# Patient Record
Sex: Male | Born: 1945
Health system: Southern US, Community
[De-identification: ages and names within clinical notes are randomized; demographics above are authoritative.]

## PROBLEM LIST (undated history)

## (undated) DIAGNOSIS — L821 Other seborrheic keratosis: Secondary | ICD-10-CM

## (undated) DIAGNOSIS — C4490 Unspecified malignant neoplasm of skin, unspecified: Secondary | ICD-10-CM

## (undated) DIAGNOSIS — I480 Paroxysmal atrial fibrillation: Secondary | ICD-10-CM

## (undated) DIAGNOSIS — E785 Hyperlipidemia, unspecified: Secondary | ICD-10-CM

## (undated) DIAGNOSIS — K219 Gastro-esophageal reflux disease without esophagitis: Secondary | ICD-10-CM

## (undated) DIAGNOSIS — B159 Hepatitis A without hepatic coma: Secondary | ICD-10-CM

## (undated) DIAGNOSIS — F32A Depression, unspecified: Secondary | ICD-10-CM

## (undated) DIAGNOSIS — F329 Major depressive disorder, single episode, unspecified: Secondary | ICD-10-CM

## (undated) DIAGNOSIS — R269 Unspecified abnormalities of gait and mobility: Secondary | ICD-10-CM

## (undated) DIAGNOSIS — B029 Zoster without complications: Secondary | ICD-10-CM

## (undated) DIAGNOSIS — I10 Essential (primary) hypertension: Secondary | ICD-10-CM

## (undated) HISTORY — PX: SPINE SURGERY: SHX786

## (undated) HISTORY — DX: Depression, unspecified: F32.A

## (undated) HISTORY — DX: Gastro-esophageal reflux disease without esophagitis: K21.9

## (undated) HISTORY — DX: Zoster without complications: B02.9

## (undated) HISTORY — PX: COLONOSCOPY: SHX174

## (undated) HISTORY — DX: Major depressive disorder, single episode, unspecified: F32.9

## (undated) HISTORY — PX: VASECTOMY: SHX75

## (undated) HISTORY — PX: HERNIA REPAIR: SHX51

## (undated) HISTORY — DX: Paroxysmal atrial fibrillation: I48.0

## (undated) HISTORY — DX: Other seborrheic keratosis: L82.1

## (undated) HISTORY — DX: Hyperlipidemia, unspecified: E78.5

## (undated) HISTORY — PX: POLYPECTOMY: SHX149

## (undated) HISTORY — PX: TONSILLECTOMY AND ADENOIDECTOMY: SUR1326

## (undated) HISTORY — DX: Essential (primary) hypertension: I10

## (undated) HISTORY — DX: Unspecified malignant neoplasm of skin, unspecified: C44.90

## (undated) HISTORY — DX: Hepatitis a without hepatic coma: B15.9

## (undated) HISTORY — DX: Unspecified abnormalities of gait and mobility: R26.9

---
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
## 1999-07-16 HISTORY — PX: COLONOSCOPY W/ POLYPECTOMY: SHX1380

## 2001-10-19 ENCOUNTER — Encounter: Payer: Self-pay | Admitting: Internal Medicine

## 2001-10-19 ENCOUNTER — Encounter: Admission: RE | Admit: 2001-10-19 | Discharge: 2001-10-19 | Payer: Self-pay | Admitting: Internal Medicine

## 2002-07-15 HISTORY — PX: CYSTOSCOPY: SUR368

## 2003-01-01 ENCOUNTER — Emergency Department (HOSPITAL_COMMUNITY): Admission: EM | Admit: 2003-01-01 | Discharge: 2003-01-01 | Payer: Self-pay | Admitting: *Deleted

## 2004-09-17 ENCOUNTER — Ambulatory Visit: Payer: Self-pay | Admitting: Internal Medicine

## 2004-10-12 ENCOUNTER — Ambulatory Visit: Payer: Self-pay | Admitting: Internal Medicine

## 2004-12-31 ENCOUNTER — Ambulatory Visit: Payer: Self-pay | Admitting: Internal Medicine

## 2005-01-18 ENCOUNTER — Ambulatory Visit: Payer: Self-pay | Admitting: Internal Medicine

## 2005-01-22 ENCOUNTER — Ambulatory Visit: Payer: Self-pay | Admitting: Internal Medicine

## 2005-04-15 ENCOUNTER — Ambulatory Visit: Payer: Self-pay | Admitting: Internal Medicine

## 2005-05-20 ENCOUNTER — Ambulatory Visit: Payer: Self-pay | Admitting: Internal Medicine

## 2005-10-07 ENCOUNTER — Ambulatory Visit: Payer: Self-pay | Admitting: Internal Medicine

## 2006-04-11 ENCOUNTER — Ambulatory Visit: Payer: Self-pay | Admitting: Internal Medicine

## 2006-04-21 ENCOUNTER — Ambulatory Visit: Payer: Self-pay | Admitting: Internal Medicine

## 2006-07-21 ENCOUNTER — Ambulatory Visit: Payer: Self-pay | Admitting: Internal Medicine

## 2006-07-21 LAB — CONVERTED CEMR LAB
Free T4: 0.9 ng/dL (ref 0.6–1.6)
Magnesium: 2.2 mg/dL (ref 1.5–2.5)
Potassium: 3.6 meq/L (ref 3.5–5.1)
T3, Free: 2.8 pg/mL (ref 2.3–4.2)
TSH: 0.72 microintl units/mL (ref 0.35–5.50)

## 2006-08-29 ENCOUNTER — Ambulatory Visit: Payer: Self-pay | Admitting: Internal Medicine

## 2006-08-29 LAB — CONVERTED CEMR LAB
Cholesterol: 117 mg/dL (ref 0–200)
HDL: 34.1 mg/dL — ABNORMAL LOW (ref >39.0)
LDL Cholesterol: 65 mg/dL (ref 0–99)
Total CHOL/HDL Ratio: 3.4
Triglycerides: 92 mg/dL (ref 0–149)
VLDL: 18 mg/dL (ref 0–40)

## 2006-09-15 ENCOUNTER — Ambulatory Visit: Payer: Self-pay | Admitting: Internal Medicine

## 2006-12-16 ENCOUNTER — Telehealth: Payer: Self-pay | Admitting: Internal Medicine

## 2006-12-18 DIAGNOSIS — M4690 Unspecified inflammatory spondylopathy, site unspecified: Secondary | ICD-10-CM | POA: Insufficient documentation

## 2006-12-18 DIAGNOSIS — IMO0002 Reserved for concepts with insufficient information to code with codable children: Secondary | ICD-10-CM | POA: Insufficient documentation

## 2006-12-29 ENCOUNTER — Ambulatory Visit: Payer: Self-pay | Admitting: Gastroenterology

## 2007-01-05 ENCOUNTER — Encounter: Payer: Self-pay | Admitting: Internal Medicine

## 2007-01-12 ENCOUNTER — Ambulatory Visit: Payer: Self-pay | Admitting: Gastroenterology

## 2007-01-12 ENCOUNTER — Telehealth (INDEPENDENT_AMBULATORY_CARE_PROVIDER_SITE_OTHER): Payer: Self-pay | Admitting: *Deleted

## 2007-02-23 ENCOUNTER — Encounter: Payer: Self-pay | Admitting: Internal Medicine

## 2007-05-22 ENCOUNTER — Telehealth: Payer: Self-pay | Admitting: Internal Medicine

## 2007-05-29 ENCOUNTER — Ambulatory Visit: Payer: Self-pay | Admitting: Internal Medicine

## 2007-06-01 ENCOUNTER — Ambulatory Visit: Payer: Self-pay | Admitting: Internal Medicine

## 2007-06-01 DIAGNOSIS — I10 Essential (primary) hypertension: Secondary | ICD-10-CM | POA: Insufficient documentation

## 2007-08-25 ENCOUNTER — Telehealth (INDEPENDENT_AMBULATORY_CARE_PROVIDER_SITE_OTHER): Payer: Self-pay | Admitting: *Deleted

## 2007-10-21 ENCOUNTER — Telehealth (INDEPENDENT_AMBULATORY_CARE_PROVIDER_SITE_OTHER): Payer: Self-pay | Admitting: *Deleted

## 2008-03-11 ENCOUNTER — Ambulatory Visit: Payer: Self-pay | Admitting: Internal Medicine

## 2008-03-11 DIAGNOSIS — Z85828 Personal history of other malignant neoplasm of skin: Secondary | ICD-10-CM | POA: Insufficient documentation

## 2008-03-11 DIAGNOSIS — E785 Hyperlipidemia, unspecified: Secondary | ICD-10-CM | POA: Insufficient documentation

## 2008-03-11 DIAGNOSIS — N400 Enlarged prostate without lower urinary tract symptoms: Secondary | ICD-10-CM | POA: Insufficient documentation

## 2008-03-11 DIAGNOSIS — D126 Benign neoplasm of colon, unspecified: Secondary | ICD-10-CM | POA: Insufficient documentation

## 2008-03-11 DIAGNOSIS — I480 Paroxysmal atrial fibrillation: Secondary | ICD-10-CM | POA: Insufficient documentation

## 2008-03-11 LAB — CONVERTED CEMR LAB
Cholesterol, target level: 200 mg/dL
HDL goal, serum: 40 mg/dL
LDL Goal: 75 mg/dL

## 2008-03-29 ENCOUNTER — Telehealth: Payer: Self-pay | Admitting: Internal Medicine

## 2008-06-08 ENCOUNTER — Ambulatory Visit: Payer: Self-pay | Admitting: Internal Medicine

## 2008-06-08 LAB — CONVERTED CEMR LAB
ALT: 21 units/L (ref 0–53)
AST: 20 units/L (ref 0–37)
Albumin: 3.7 g/dL (ref 3.5–5.2)
Alkaline Phosphatase: 28 units/L — ABNORMAL LOW (ref 39–117)
BUN: 16 mg/dL (ref 6–23)
Basophils Absolute: 0 10*3/uL (ref 0.0–0.1)
Basophils Relative: 0.2 % (ref 0.0–3.0)
Bilirubin, Direct: 0.1 mg/dL (ref 0.0–0.3)
CO2: 26 meq/L (ref 19–32)
Calcium: 8.7 mg/dL (ref 8.4–10.5)
Chloride: 108 meq/L (ref 96–112)
Cholesterol: 161 mg/dL (ref 0–200)
Creatinine, Ser: 1 mg/dL (ref 0.4–1.5)
Eosinophils Absolute: 0.3 10*3/uL (ref 0.0–0.7)
Eosinophils Relative: 5 % (ref 0.0–5.0)
GFR calc Af Amer: 97 mL/min
GFR calc non Af Amer: 80 mL/min
Glucose, Bld: 94 mg/dL (ref 70–99)
HCT: 43.2 % (ref 39.0–52.0)
HDL: 39.3 mg/dL (ref >39.0)
Hemoglobin: 14.9 g/dL (ref 13.0–17.0)
LDL Cholesterol: 90 mg/dL (ref 0–99)
Lymphocytes Relative: 38.6 % (ref 12.0–46.0)
MCHC: 34.5 g/dL (ref 30.0–36.0)
MCV: 95.3 fL (ref 78.0–100.0)
Monocytes Absolute: 0.7 10*3/uL (ref 0.1–1.0)
Monocytes Relative: 10.1 % (ref 3.0–12.0)
Neutro Abs: 3.3 10*3/uL (ref 1.4–7.7)
Neutrophils Relative %: 46.1 % (ref 43.0–77.0)
PSA: 0.38 ng/mL (ref 0.10–4.00)
Platelets: 241 10*3/uL (ref 150–400)
Potassium: 3.8 meq/L (ref 3.5–5.1)
RBC: 4.54 M/uL (ref 4.22–5.81)
RDW: 12.2 % (ref 11.5–14.6)
Sodium: 140 meq/L (ref 135–145)
TSH: 1.22 microintl units/mL (ref 0.35–5.50)
Total Bilirubin: 0.9 mg/dL (ref 0.3–1.2)
Total CHOL/HDL Ratio: 4.1
Total Protein: 6.5 g/dL (ref 6.0–8.3)
Triglycerides: 157 mg/dL — ABNORMAL HIGH (ref 0–149)
VLDL: 31 mg/dL (ref 0–40)
WBC: 7 10*3/uL (ref 4.5–10.5)

## 2008-06-15 ENCOUNTER — Ambulatory Visit: Payer: Self-pay | Admitting: Internal Medicine

## 2008-06-15 LAB — CONVERTED CEMR LAB
Cholesterol, target level: 200 mg/dL
HDL goal, serum: 40 mg/dL
LDL Goal: 100 mg/dL

## 2009-01-03 ENCOUNTER — Telehealth (INDEPENDENT_AMBULATORY_CARE_PROVIDER_SITE_OTHER): Payer: Self-pay | Admitting: *Deleted

## 2009-03-23 ENCOUNTER — Encounter: Payer: Self-pay | Admitting: Internal Medicine

## 2009-04-21 ENCOUNTER — Telehealth (INDEPENDENT_AMBULATORY_CARE_PROVIDER_SITE_OTHER): Payer: Self-pay | Admitting: *Deleted

## 2009-05-11 ENCOUNTER — Telehealth (INDEPENDENT_AMBULATORY_CARE_PROVIDER_SITE_OTHER): Payer: Self-pay | Admitting: *Deleted

## 2009-05-26 ENCOUNTER — Ambulatory Visit: Payer: Self-pay | Admitting: Internal Medicine

## 2009-05-26 DIAGNOSIS — R7303 Prediabetes: Secondary | ICD-10-CM | POA: Insufficient documentation

## 2009-05-26 DIAGNOSIS — R739 Hyperglycemia, unspecified: Secondary | ICD-10-CM | POA: Insufficient documentation

## 2009-06-26 ENCOUNTER — Ambulatory Visit: Payer: Self-pay | Admitting: Internal Medicine

## 2009-07-27 ENCOUNTER — Telehealth (INDEPENDENT_AMBULATORY_CARE_PROVIDER_SITE_OTHER): Payer: Self-pay | Admitting: *Deleted

## 2009-10-12 ENCOUNTER — Telehealth (INDEPENDENT_AMBULATORY_CARE_PROVIDER_SITE_OTHER): Payer: Self-pay | Admitting: *Deleted

## 2010-01-09 ENCOUNTER — Telehealth (INDEPENDENT_AMBULATORY_CARE_PROVIDER_SITE_OTHER): Payer: Self-pay | Admitting: *Deleted

## 2010-01-18 ENCOUNTER — Encounter: Payer: Self-pay | Admitting: Internal Medicine

## 2010-01-18 ENCOUNTER — Telehealth (INDEPENDENT_AMBULATORY_CARE_PROVIDER_SITE_OTHER): Payer: Self-pay | Admitting: *Deleted

## 2010-02-13 ENCOUNTER — Telehealth: Payer: Self-pay | Admitting: Internal Medicine

## 2010-03-12 ENCOUNTER — Encounter: Payer: Self-pay | Admitting: Internal Medicine

## 2010-05-22 ENCOUNTER — Ambulatory Visit: Payer: Self-pay | Admitting: Internal Medicine

## 2010-05-24 ENCOUNTER — Ambulatory Visit: Payer: Self-pay | Admitting: Cardiology

## 2010-05-24 DIAGNOSIS — E663 Overweight: Secondary | ICD-10-CM | POA: Insufficient documentation

## 2010-05-25 LAB — CONVERTED CEMR LAB
BUN: 22 mg/dL (ref 6–23)
Creatinine, Ser: 1 mg/dL (ref 0.4–1.5)
Potassium: 4.2 meq/L (ref 3.5–5.1)

## 2010-05-31 ENCOUNTER — Ambulatory Visit: Payer: Self-pay

## 2010-05-31 ENCOUNTER — Ambulatory Visit: Payer: Self-pay | Admitting: Cardiology

## 2010-05-31 ENCOUNTER — Encounter: Payer: Self-pay | Admitting: Internal Medicine

## 2010-05-31 DIAGNOSIS — R0602 Shortness of breath: Secondary | ICD-10-CM | POA: Insufficient documentation

## 2010-06-14 ENCOUNTER — Telehealth (INDEPENDENT_AMBULATORY_CARE_PROVIDER_SITE_OTHER): Payer: Self-pay | Admitting: *Deleted

## 2010-06-15 ENCOUNTER — Ambulatory Visit: Payer: Self-pay | Admitting: Cardiology

## 2010-06-15 ENCOUNTER — Ambulatory Visit (HOSPITAL_COMMUNITY)
Admission: RE | Admit: 2010-06-15 | Discharge: 2010-06-15 | Payer: Self-pay | Source: Home / Self Care | Admitting: Cardiology

## 2010-06-15 ENCOUNTER — Ambulatory Visit: Payer: Self-pay

## 2010-06-22 ENCOUNTER — Telehealth (INDEPENDENT_AMBULATORY_CARE_PROVIDER_SITE_OTHER): Payer: Self-pay | Admitting: *Deleted

## 2010-08-12 LAB — CONVERTED CEMR LAB
Cholesterol: 161 mg/dL (ref 0–200)
HDL: 38.7 mg/dL — ABNORMAL LOW (ref >39.00)
Hgb A1c MFr Bld: 5.6 % (ref 4.6–6.5)
LDL Cholesterol: 98 mg/dL (ref 0–99)
PSA: 0.29 ng/mL (ref 0.10–4.00)
Total CHOL/HDL Ratio: 4
Triglycerides: 121 mg/dL (ref 0.0–149.0)
VLDL: 24.2 mg/dL (ref 0.0–40.0)

## 2010-08-14 NOTE — Progress Notes (Signed)
Summary: strength increase  Phone Note Refill Request   Refills Requested: Medication #1:  CRESTOR 20 MG TABS 1 once daily Mon NEW RX FOR CRESTOR 40MG  #30 TAKE 1/2 TAB  MWF. pt is currently take crestor 20mg  mwf and would like to know if it is ok to increase med strength and take 1/2 tab...............Marland KitchenFelecia Deloach CMA  January 03, 2009 4:43 PM   Follow-up for Phone Call        dr hopper pls advise on med strength increase...........Marland KitchenFelecia Deloach CMA  January 03, 2009 4:45 PM  Additional Follow-up for Phone Call Additional follow up Details #1::        OK Additional Follow-up by: Marga Melnick MD,  January 03, 2009 4:53 PM    Additional Follow-up for Phone Call Additional follow up Details #2::    pt aware rx sent to pharmacy...........Marland KitchenFelecia Deloach CMA  January 04, 2009 11:35 AM  New/Updated Medications: CRESTOR 40 MG TABS (ROSUVASTATIN CALCIUM) take 1/2 tab  once daily Mon, Weds, Fri   Prescriptions: CRESTOR 40 MG TABS (ROSUVASTATIN CALCIUM) take 1/2 tab  once daily Mon, Weds, Fri  #30 x 5   Entered by:   Jeremy Johann CMA   Authorized by:   Marga Melnick MD   Signed by:   Jeremy Johann CMA on 01/04/2009   Method used:   Faxed to ...       [facility] [address] (retail)       [phone]       [place], [place]  [address]       Ph: [phone]       Fax: [phone]   RxID:   [id number]

## 2010-08-14 NOTE — Progress Notes (Signed)
Summary: Stress Echo Pre- Procedure  Phone Note Outgoing Call   Call placed by: Antionette Char RN,  June 14, 2010 10:51 AM Call placed to: Patient Reason for Call: Confirm/change Appt Summary of Call: Left message on AM regarding Dobutamine Echo instructions. Instructed patient to arrive at 8 am. Bonnita Levan RN

## 2010-08-14 NOTE — Progress Notes (Signed)
Summary: Refill Requests               Phone Note Refill Request Message from:  Patient on June 22, 2010 1:50 PM  Refills Requested: Medication #1:  TOPROL XL 50 MG XR24H-TAB 1 by mouth two times a day   Dosage confirmed as above?Dosage Confirmed   Supply Requested: 3 months  Medication #2:  BENAZEPRIL HCL 20 MG  TABS 1 by mouth two times a day   Dosage confirmed as above?Dosage Confirmed   Supply Requested: 3 months  Medication #3:  CARDIZEM CD 180 MG XR24H-CAP 1 by mouth  two times a day   Dosage confirmed as above?Dosage Confirmed   Supply Requested: 3 months [facility], forms with Tresa Endo  Next Appointment Scheduled: 1.19.12 Initial call taken by: Harold Barban,  June 22, 2010 1:50 PM  Follow-up for Phone Call        Forms not needed I will fax them directly. Left message on machine to call back to office. Lucious Groves CMA  June 22, 2010 2:39 PM   Additional Follow-up for Phone Call Additional follow up Details #1::        Pt is aware that we have faxed to [facility].  Additional Follow-up by: Army Fossa CMA,  June 22, 2010 2:56 PM    Prescriptions: CARDIZEM CD 180 MG XR24H-CAP (DILTIAZEM HCL COATED BEADS) 1 by mouth  two times a day  #180 x 1   Entered by:   Lucious Groves CMA   Authorized by:   Marga Melnick MD   Signed by:   Lucious Groves CMA on 06/22/2010   Method used:   Faxed to ...       [facility] (mail-order)             , [place]         Ph: [phone]       Fax: [phone]   RxID:   [id number] BENAZEPRIL HCL 20 MG  TABS (BENAZEPRIL HCL) 1 by mouth two times a day  #180 x 1   Entered by:   Lucious Groves CMA   Authorized by:   Marga Melnick MD   Signed by:   Lucious Groves CMA on 06/22/2010   Method used:   Faxed to ...       [facility] (mail-order)             , [place]         Ph: [phone]       Fax: [phone]   RxID:   [id number] TOPROL XL 50 MG XR24H-TAB (METOPROLOL SUCCINATE) 1 by mouth two times a day  #180 x 1   Entered by:   Lucious Groves  CMA   Authorized by:   Marga Melnick MD   Signed by:   Lucious Groves CMA on 06/22/2010   Method used:   Faxed to ...       [facility] (mail-order)             , [place]         Ph: [phone]       Fax: [phone]   RxID:   [id number]

## 2010-08-14 NOTE — Progress Notes (Signed)
Summary: NEEDS 23 DAY [facility] PRESCRIPTIONS  Phone Note Call from Patient Call back at [facility] CELL = [phone]   Caller: Patient Summary of Call: PATIENT'S WIFE BARBARA DROPPED OFF LETTER REQUESTING TWO PRESCRIPTIONS THAT NEED TO GO TO [facility]  PATIENT NOW HAS MEDICARE ONLY  NEEDS 90 DAY SUPPLY OF METOPROLOL--SEE COPY OF PRESCRIPTION LABEL  NEEDS 90 DAY SUPPLY FOR PREVACID 30 MG CAPSULES  ALSO REQUESTS SAMPLES OF CRESTOR  WILL TAKE LETTER TO [facility]RAE'S DESK IN [employer] Initial call taken by: Jerolyn Shin,  January 18, 2010 2:51 PM  Follow-up for Phone Call        Patient's wife aware RX for Metoprolol sent in, Samples of Crestor at the front and I will f/u with her on Monday Re: Acid reflux med.   Dr.Hopper patient would like a acid reflux med that has a generic to be sent to [facility], Please advise Follow-up by: Shonna Chock,  January 19, 2010 5:11 PM  Additional Follow-up for Phone Call Additional follow up Details #1::        Per Dr.Hopper Omeprazole pre breakfast.   Patient's wife aware Additional Follow-up by: Shonna Chock,  January 19, 2010 5:25 PM    New/Updated Medications: PREVACID 30 MG CPDR (LANSOPRAZOLE) 1 by mouth once daily OMEPRAZOLE 20 MG TBEC (OMEPRAZOLE) 1 by mouth before breakfast Prescriptions: OMEPRAZOLE 20 MG TBEC (OMEPRAZOLE) 1 by mouth before breakfast  #90 x 3   Entered by:   Shonna Chock   Authorized by:   Marga Melnick MD   Signed by:   Shonna Chock on 01/19/2010   Method used:   Faxed to ...       [facility] (mail-order)             , [place]         Ph: [phone]       Fax: [phone]   RxID:   [id number] TOPROL XL 50 MG XR24H-TAB (METOPROLOL SUCCINATE) 1 by mouth two times a day  #180 x 1   Entered by:   Shonna Chock   Authorized by:   Marga Melnick MD   Signed by:   Shonna Chock on 01/19/2010   Method used:   Faxed to ...       [facility] (mail-order)             , [place]         Ph: [phone]       Fax: [phone]  RxID:   [id number]

## 2010-08-14 NOTE — Progress Notes (Signed)
Summary: WANTS SHINGLES VACCINE  Phone Note Call from Patient Call back at [phone]   Caller: Spouse Summary of Call: PT WIFE CALLED HAVE VISIT 05/26/09, WANTS TO GET SHINGLES VACCINE, ALSO PNEUMONIA SHOT. WANTS  NURSE TO KNOW Initial call taken by: Kandice Hams,  May 11, 2009 3:34 PM  Follow-up for Phone Call        Noted, I will inform patient when she comes in that Shingles vaccine is on back order and she will have to call periodically to see if its in Follow-up by: Shonna Chock,  May 11, 2009 3:49 PM

## 2010-08-14 NOTE — Progress Notes (Signed)
Summary: REQUESTS CITALOPRAM  Phone Note Call from Patient Call back at HIS CELL = [phone]   Caller: WIFE BARBARA Summary of Call: WIFE BROUGHT IN LETTER FROM PATIENT---HE IS ASKING IF DR HOPPER CAN WRITE HIM A PRESCRIPTION FOR CITALOPRAM 20 MG??  HE HAS ENCLOSED A [facility] FORM TO REQUEST A 90 DAY SUPPLY  IF ANY QUESTIONIS, PLEASE CALL HIM ON HIS CELL PHONE AT [phone] OR HIS WIFE BARBARA CELL PHONE AT [phone]  WILL TAKE FORM AND LETTER TO [facility] IN [employer] Initial call taken by: Jerolyn Shin,  February 13, 2010 3:04 PM  Follow-up for Phone Call        Dr.Hopper please advise, this was placed on ledge yesterday for review Follow-up by: Shonna Chock CMA,  February 14, 2010 8:36 AM  Additional Follow-up for Phone Call Additional follow up Details #1::        OK #90    Additional Follow-up for Phone Call Additional follow up Details #2::    Faxed, left message on voicemail notifying patient. Follow-up by: Lucious Groves CMA,  February 14, 2010 4:33 PM  New/Updated Medications: CITALOPRAM HYDROBROMIDE 20 MG TABS (CITALOPRAM HYDROBROMIDE) 1 once daily Prescriptions: CITALOPRAM HYDROBROMIDE 20 MG TABS (CITALOPRAM HYDROBROMIDE) 1 once daily  #90 x 0   Entered and Authorized by:   Marga Melnick MD   Signed by:   Marga Melnick MD on 02/14/2010   Method used:   Printed then faxed to ...       [facility] [address] (retail)       [phone]       [place], [place]  [address]       Ph: [phone]       Fax: [phone]   RxID:   [id number]

## 2010-08-14 NOTE — Assessment & Plan Note (Signed)
Summary: med refill//fd Denton Lank wants to check and see if he has tentas ...   Vital Signs:  Patient profile:   65 year old male Height:      71 inches Weight:      238.4 pounds BMI:     33.37 Temp:     97.7 degrees F oral Pulse rate:   54 / minute Resp:     14 per minute BP sitting:   130 / 82  (left arm) Cuff size:   large  Vitals Entered By: Shonna Chock (May 26, 2009 11:11 AM) CC: CPX with fasting labs , Hypertension Management, Lipid Management Comments REVIEWED MED LIST, PATIENT AGREED DOSE AND INSTRUCTION CORRECT    CC:  CPX with fasting labs , Hypertension Management, and Lipid Management.  History of Present Illness: Admitted overnight for RAF 03/22/2009; lab records reviewed : labs essen WNL. Dr Rhona Leavens seen post D/C ; CCB started , Amlodipine D/Ced. BP controlled with increase in ACE-I & CCB doses. On ASA 325 MG DAILY, NO ANTICOAGULANTS. Lipids not assessed.  Hypertension History:      He complains of peripheral edema, but denies headache, chest pain, palpitations, dyspnea with exertion, orthopnea, PND, visual symptoms, neurologic problems, syncope, and side effects from treatment.  He notes no problems with any antihypertensive medication side effects.  BP now 115-120/75-80.        Positive major cardiovascular risk factors include male age 56 years old or older, hyperlipidemia, and hypertension.  Negative major cardiovascular risk factors include no history of diabetes, negative family history for ischemic heart disease, and non-tobacco-user status.        Further assessment for target organ damage reveals no history of ASHD, stroke/TIA, or peripheral vascular disease.    Lipid Management History:      Positive NCEP/ATP III risk factors include male age 65 years old or older, HDL cholesterol less than 40, and hypertension.  Negative NCEP/ATP III risk factors include non-diabetic, no family history for ischemic heart disease, non-tobacco-user status, no ASHD (atherosclerotic  heart disease), no prior stroke/TIA, no peripheral vascular disease, and no history of aortic aneurysm.      Preventive Screening-Counseling & Management  Caffeine-Diet-Exercise     Does Patient Exercise: no  Allergies (verified): No Known Drug Allergies  Past History:  Past Medical History: Hypertension hepatitis age 71 Atrial fibrillation, paroxysmal 12/07, 03/2009 Hyperlipidemia Skin cancer, hx of basal cell, Dr Gabriel Cirri Spondylosis  @ L5-S1  Past Surgical History: Vasectomy T/A Colonoscopy 2008 : neg, Dr Barnet Pall (due 2018) ; Cystoscopy 2004 for microscopic hematuria: negative Colon polypectomy 2003 Concussion age 88  Family History: daughter -hematuria, neg evaluation Father:Alsheimer's, pacer, valve replacement  Mother: pacer Siblings: neg Puncle MI @ 57; no premature CAD in FH  Social History: Never Smoked Low fat diet Occupation: [employer] part time Married Alcohol use-yes Regular exercise-no Does Patient Exercise:  no  Review of Systems General:  Complains of sleep disorder; denies fatigue and weight loss; Wife describes "gasping " @ night.Screen prompted full OSA study 11/23.. Eyes:  Denies blurring, double vision, and vision loss-both eyes. ENT:  Denies difficulty swallowing and hoarseness. CV:  Denies leg cramps with exertion, lightheadness, and near fainting. Resp:  Complains of excessive snoring; denies hypersomnolence and morning headaches. GI:  Denies abdominal pain, bloody stools, dark tarry stools, and indigestion. GU:  Denies discharge, dysuria, and hematuria. Derm:  Denies poor wound healing. Neuro:  Denies disturbances in coordination, numbness, poor balance, and tingling. Endo:  Denies cold  intolerance, excessive hunger, excessive thirst, excessive urination, and heat intolerance; Hyperglycemia(102,130) in hospital  03/2009.  Physical Exam  General:  well-nourished,in no acute distress; alert,appropriate and cooperative  throughout examination Head:  Normocephalic and atraumatic without obvious abnormalities. No apparent alopecia  Neck:  No deformities, masses, or tenderness noted. Lungs:  Normal respiratory effort, chest expands symmetrically. Lungs are clear to auscultation, no crackles or wheezes. Heart:  regular rhythm and bradycardia S4.   Abdomen:  Bowel sounds positive,abdomen soft and non-tender without masses, organomegaly or hernias noted. Rectal:  No external abnormalities noted. Normal sphincter tone. No rectal masses or tenderness. Genitalia:  Testes bilaterally descended without nodularity, tenderness or masses. No scrotal masses or lesions. No penis lesions or urethral discharge.L varicocele.   Prostate:  Prostate gland firm and smooth,ULN  enlargement, w/o nodularity, tenderness, mass, asymmetry or induration.   Impression & Recommendations:  Problem # 1:  ATRIAL FIBRILLATION (ICD-427.31)  Resolved on Beta blocker & CCB His updated medication list for this problem includes:    Toprol Xl 50 Mg Xr24h-tab (Metoprolol succinate) .Marland Kitchen... 1 by mouth two times a day    Cardizem Cd 180 Mg Xr24h-cap (Diltiazem hcl coated beads) .Marland Kitchen... 1 by mouth once daily  Orders: EKG w/ Interpretation (93000)  Problem # 2:  FASTING HYPERGLYCEMIA (ICD-790.29)  Orders: TLB-A1C / Hgb A1C (Glycohemoglobin) (83036-A1C)  Problem # 3:  HYPERLIPIDEMIA (ICD-272.4)  His updated medication list for this problem includes:    Crestor 20 Mg Tabs (Rosuvastatin calcium) ..... Once daily as directed  Orders: EKG w/ Interpretation (93000) TLB-Lipid Panel (80061-LIPID)  Problem # 4:  HYPERTENSION, ESSENTIAL NOS (ICD-401.9)  Controlled His updated medication list for this problem includes:    Toprol Xl 50 Mg Xr24h-tab (Metoprolol succinate) .Marland Kitchen... 1 by mouth two times a day    Benazepril Hcl 20 Mg Tabs (Benazepril hcl) .Marland Kitchen... 1 tab qd    Cardizem Cd 180 Mg Xr24h-cap (Diltiazem hcl coated beads) .Marland Kitchen... 1 by mouth once  daily  Orders: EKG w/ Interpretation (93000)  Problem # 5:  HYPERPLASIA PROSTATE UNS W/O UR OBST & OTH LUTS (ICD-600.90)  Orders: TLB-PSA (Prostate Specific Antigen) (84153-PSA)  Complete Medication List: 1)  Toprol Xl 50 Mg Xr24h-tab (Metoprolol succinate) .Marland Kitchen.. 1 by mouth two times a day 2)  Benazepril Hcl 20 Mg Tabs (Benazepril hcl) .Marland Kitchen.. 1 tab qd 3)  Cardizem Cd 180 Mg Xr24h-cap (Diltiazem hcl coated beads) .Marland Kitchen.. 1 by mouth once daily 4)  Asa 325mg  Qd  5)  Fish Oil  6)  Multivitamin  7)  Crestor 20 Mg Tabs (Rosuvastatin calcium) .... Once daily as directed  Other Orders: Tdap => 13yrs IM (16109) Admin 1st Vaccine (60454)  Hypertension Assessment/Plan:      The patient's hypertensive risk group is category B: At least one risk factor (excluding diabetes) with no target organ damage.  His calculated 10 year risk of coronary heart disease is 9 %.  Today's blood pressure is 130/82.    Lipid Assessment/Plan:      Based on NCEP/ATP III, the patient's risk factor category is "2 or more risk factors and a calculated 10 year CAD risk of < 20%".  The patient's lipid goals are as follows: Total cholesterol goal is 200; LDL cholesterol goal is 100; HDL cholesterol goal is 40; Triglyceride goal is 150.  His LDL cholesterol goal has been met.    Patient Instructions: 1)  Consume < 40 grams of sugar / day from labeled  foods/ drinks. Crestor 20 mg M,W,&  F. Prescriptions: CRESTOR 20 MG TABS (ROSUVASTATIN CALCIUM) once daily as directed  #30 x 5   Entered and Authorized by:   Marga Melnick MD   Signed by:   Marga Melnick MD on 05/26/2009   Method used:   Print then Give to Patient   RxID:   [id number]    Immunizations Administered:  Tetanus Vaccine:    Vaccine Type: Tdap    Site: right deltoid    Mfr: Sanofi Pasteur    Dose: 0.5 ml    Route: IM    Given by: Shonna Chock    Exp. Date: 01/19/2011    Lot #: [id number]    VIS given: 06/02/07 version given May 26, 2009.  Appended Document: med refill//fd /wife wants to check and see if he has tentas .Marland KitchenMarland Kitchen

## 2010-08-14 NOTE — Miscellaneous (Signed)
Summary: Appointment Canceled  Appointment status changed to canceled by [employer] on 05/31/2010 9:52 AM.  Cancellation Comments --------------------- stress echo with joyce/sl  Appointment Information ----------------------- Appt Type:  EMR NO DOCUMENT      Date:  Friday, June 22, 2010      Time:  2:00 PM for 30 min   Urgency:  Routine   Made By:  Pearson Grippe  To Visit:  [id number]    Reason:  stress echo with joyce/sl  Appt Comments ------------- -- 05/31/10 9:52: (CEMR) CANCELED -- stress echo with joyce/sl -- 05/31/10 9:43: (CEMR) BOOKED -- Routine EMR NO DOCUMENT at 06/22/2010 2:00 PM for 30 min stress echo with joyce/sl

## 2010-08-14 NOTE — Assessment & Plan Note (Signed)
Summary: np6/afib/essential HTN      Allergies Added: NKDA  Visit Type:  Initial Consult Primary Provider:  Marga Melnick MD  CC:  Atrial Fibrillation.  History of Present Illness: The patient presents as a new patient switching his care to our cardiovascular practice. He has had 2 episodes of atrial fibrillation in the past. Both were treated at [facility] and converted spontaneously. He did have an echocardiogram he reports last year but he said was normal and a stress test about 4 years ago. In between these episodes he is does not describe palpitations, presyncope or syncope. He doesn't exercise although he can push mower and climbs stairs routinely. With this level of activity he does not get chest pressure, neck or arm discomfort. He does not have shortness of breath, PND or orthopnea.  Current Medications (verified): 1)  Toprol Xl 50 Mg Xr24h-Tab (Metoprolol Succinate) .Marland Kitchen.. 1 By Mouth Two Times A Day 2)  Benazepril Hcl 20 Mg  Tabs (Benazepril Hcl) .Marland Kitchen.. 1 By Mouth Two Times A Day 3)  Cardizem Cd 180 Mg Xr24h-Cap (Diltiazem Hcl Coated Beads) .Marland Kitchen.. 1 By Mouth  Two Times A Day 4)  Asa 325mg  Qd 5)  Fish Oil 6)  Multivitamin 7)  Crestor 20 Mg Tabs (Rosuvastatin Calcium) .... Once Daily As Directed 8)  Omeprazole 20 Mg Tbec (Omeprazole) .Marland Kitchen.. 1 By Mouth Before Breakfast, As Needed 9)  Hydrochlorothiazide 12.5 Mg Caps (Hydrochlorothiazide) .Marland Kitchen.. 1 Once Daily  Allergies (verified): No Known Drug Allergies  Past History:  Past Medical History: Hypertension Hepatitis age 7 Atrial fibrillation, paroxysmal 12/07, 03/2009 Hyperlipidemia Skin cancer, hx of basal cell, Dr Gabriel Cirri Spondylosis  @ L5-S1  Past Surgical History: Reviewed history from 05/26/2009 and no changes required. Vasectomy T/A Colonoscopy 2008 : neg, Dr Barnet Pall (due 2018) ; Cystoscopy 2004 for microscopic hematuria: negative Colon polypectomy 2003 Concussion age 18  Family  History: Daughter -hematuria, neg evaluation Father:Alsheimer's, pacer, valve replacement  Mother: pacer Siblings: neg Puncle MI @ 58; no premature CAD in FH  Review of Systems       Positive for joint pains, reflux. Negative for all other systems.  Vital Signs:  Patient profile:   65 year old male Height:      71 inches Weight:      244 pounds BMI:     34.15 Pulse rate:   61 / minute Resp:     16 per minute BP sitting:   148 / 84  (right arm)  Vitals Entered By: Marrion Coy, CNA (May 24, 2010 10:23 AM)  Physical Exam  General:  Well developed, well nourished, in no acute distress. Head:  normocephalic and atraumatic Eyes:  PERRLA/EOM intact; conjunctiva and lids normal. Mouth:  Teeth, gums and palate normal. Oral mucosa normal. Neck:  Neck supple, no JVD. No masses, thyromegaly or abnormal cervical nodes. Chest Wall:  no deformities Lungs:  Clear bilaterally to auscultation and percussion. Abdomen:  Bowel sounds positive; abdomen soft and non-tender without masses, organomegaly, or hernias noted. No hepatosplenomegaly. Msk:  Back normal, normal gait. Muscle strength and tone normal. Extremities:  No clubbing or cyanosis. Neurologic:  Alert and oriented x 3. Skin:  Intact without lesions or rashes. Cervical Nodes:  no significant adenopathy Axillary Nodes:  no significant adenopathy Inguinal Nodes:  no significant adenopathy Psych:  Normal affect.   Detailed Cardiovascular Exam  Neck    Carotids: Carotids full and equal bilaterally without bruits.      Neck Veins: Normal, no  JVD.    Heart    Inspection: no deformities or lifts noted.      Palpation: normal PMI with no thrills palpable.      Auscultation: regular rate and rhythm, S1, S2 without murmurs, rubs, gallops, or clicks.    Vascular    Abdominal Aorta: no palpable masses, pulsations, or audible bruits.      Femoral Pulses: normal femoral pulses bilaterally.      Pedal Pulses: normal pedal pulses  bilaterally.      Radial Pulses: normal radial pulses bilaterally.      Peripheral Circulation: no clubbing, cyanosis, or edema noted with normal capillary refill.     EKG  Procedure date:  05/24/2010  Findings:      Sinus rhythm, rate 60, left axis deviation, early transition in lead V2, no acute ST-T wave changes.  Impression & Recommendations:  Problem # 1:  ATRIAL FIBRILLATION (ICD-427.31) This point I agree with current therapy. If this happens again I would strongly consider "pill in the pocket" flecainide. I would like to do an exercise treadmill test and get the recent echo to make sure he has no overt structural or other heart disease. Orders: EKG w/ Interpretation (93000) Treadmill (Treadmill)  Problem # 2:  HYPERTENSION, ESSENTIAL NOS (ICD-401.9) His blood pressure is fold. He will continue the meds as listed.  Problem # 3:  OVERWEIGHT (ICD-278.02) I discussed with him the need for exercise and weight loss. I will give to him specific instructions at the time of his treadmill.  Patient Instructions: 1)  Your physician recommends that you schedule a follow-up appointment at the time of your treadmill 2)  Your physician recommends that you continue on your current medications as directed. Please refer to the Current Medication list given to you today. 3)  Your physician has requested that you have an exercise tolerance test.  For further information please visit [email or web address]  Please also follow instruction sheet, as given.

## 2010-08-14 NOTE — Progress Notes (Signed)
Summary: TOPROL XL 50MG   Phone Note Refill Request Message from:  Pharmacy on [facility]   Refills Requested: Medication #1:  TOPROL XL 50MG  QTY 60--- PHARMACY DIDN'T LEAVE DIRECTIONS-----   [facility] [id number] FAX [phone]    DR.HOPPER   Method Requested: Fax to Local Pharmacy Initial call taken by: Vanessa [place],  August 25, 2007 3:35 PM  Follow-up for Phone Call        CALLED AND VERIFIED HE TAKES ONE TABLET DAILY......................................................................Marland KitchenWendall Stade  August 25, 2007 3:44 PM

## 2010-08-14 NOTE — Progress Notes (Signed)
Summary: rx - dr hopper  Phone Note Call from Patient Call back at [phone]   Caller: Spouse Summary of Call: needs rx for metoprolol 50mg  1 tab daily [facility] - [id number]  Initial call taken by: Okey Regal [place],  January 12, 2007 9:23 AM  Follow-up for Phone Call        RX CALLED IN [facility] VM PT WIFE AWARE Follow-up by: Kandice Hams,  January 12, 2007 10:10 AM  New/Updated Medications: METOPROLOL TARTRATE 50 MG  TABS (METOPROLOL TARTRATE) TAKE 1 QD  New/Updated Medications: METOPROLOL TARTRATE 50 MG  TABS (METOPROLOL TARTRATE) TAKE 1 QD  Prescriptions: METOPROLOL TARTRATE 50 MG  TABS (METOPROLOL TARTRATE) TAKE 1 QD  #30 x 6   Entered by:   Kandice Hams   Authorized by:   Marga Melnick MD   Signed by:   Kandice Hams on 01/12/2007   Method used:   Telephoned to ...         RxID:   [id number]

## 2010-08-14 NOTE — Assessment & Plan Note (Signed)
Summary: bp up/cbs   Vital Signs:  Patient profile:   65 year old male Weight:      244.4 pounds BMI:     34.21 Temp:     98.4 degrees F oral Pulse rate:   56 / minute Resp:     15 per minute BP sitting:   124 / 80  (left arm) Cuff size:   large  Vitals Entered By: Shonna Chock CMA (May 22, 2010 8:04 AM) CC: B/P concerns Comments **Patient changed med (benazepril and metoprolol to 3 x daily for a little while to decrease B/P)**   CC:  B/P concerns.  History of Present Illness: Hypertension Follow-Up      This is a 65 year old man who presents for Hypertension follow-up.  The patient reports slight  edema, but denies lightheadedness, urinary frequency, headaches, and fatigue.  The patient denies the following associated symptoms: chest pain, chest pressure, exercise intolerance, dyspnea, palpitations, and syncope.  Adjunctive measures currently used by the patient include salt restriction.  BP rose to 150/100 over 3 weeks. He increased Metoprolol & Benazepril to three times a day with drop in BP to 130/85 & pulse to 57 . Note : he is on CCB AND Beta blocker as per Dr. Joelyn Oms, [facility] because of 2 episodes of AF. In 2006 Metoprolol was started & in 2010 Cardizem was added. He requests referral to [facility]. No PMH of gout or hyperglycemia.  Allergies (verified): No Known Drug Allergies  Physical Exam  General:  well-nourished;alert,appropriate and cooperative throughout examination Eyes:  No corneal or conjunctival inflammation noted.  Funduscopic exam benign, without hemorrhages, exudates or papilledema. Lungs:  Normal respiratory effort, chest expands symmetrically. Lungs are clear to auscultation, no crackles or wheezes. Heart:  Normal rate and regular rhythm. S1 and S2 normal without gallop, murmur, click, rub or other extra sounds. Abdomen:  Bowel sounds positive,abdomen soft and non-tender without masses, organomegaly or hernias noted. No AAA or renal  artery bruits Pulses:  R and L carotid,radial,dorsalis pedis and posterior tibial pulses are full and equal bilaterally Extremities:  No clubbing, cyanosis, edema.   Impression & Recommendations:  Problem # 1:  HYPERTENSION, ESSENTIAL NOS (ICD-401.9)  Recent rise w/o trigger His updated medication list for this problem includes:    Toprol Xl 50 Mg Xr24h-tab (Metoprolol succinate) .Marland Kitchen... 1 by mouth two times a day    Benazepril Hcl 20 Mg Tabs (Benazepril hcl) .Marland Kitchen... 1 by mouth two times a day    Cardizem Cd 180 Mg Xr24h-cap (Diltiazem hcl coated beads) .Marland Kitchen... 1 by mouth  two times a day    Hydrochlorothiazide 12.5 Mg Caps (Hydrochlorothiazide) .Marland Kitchen... 1 once daily  Orders: Cardiology Referral (Cardiology) Venipuncture [phone]) TLB-Creatinine, Blood (82565-CREA) TLB-Potassium (K+) (84132-K) TLB-BUN (Urea Nitrogen) (84520-BUN)  Problem # 2:  ATRIAL FIBRILLATION (ICD-427.31)  PMH of X 2 His updated medication list for this problem includes:    Toprol Xl 50 Mg Xr24h-tab (Metoprolol succinate) .Marland Kitchen... 1 by mouth two times a day    Cardizem Cd 180 Mg Xr24h-cap (Diltiazem hcl coated beads) .Marland Kitchen... 1 by mouth  two times a day  Orders: Cardiology Referral (Cardiology)  Complete Medication List: 1)  Toprol Xl 50 Mg Xr24h-tab (Metoprolol succinate) .Marland Kitchen.. 1 by mouth two times a day 2)  Benazepril Hcl 20 Mg Tabs (Benazepril hcl) .Marland Kitchen.. 1 by mouth two times a day 3)  Cardizem Cd 180 Mg Xr24h-cap (Diltiazem hcl coated beads) .Marland Kitchen.. 1 by mouth  two times  a day 4)  Asa 325mg  Qd  5)  Fish Oil  6)  Multivitamin  7)  Crestor 20 Mg Tabs (Rosuvastatin calcium) .... Once daily as directed 8)  Omeprazole 20 Mg Tbec (Omeprazole) .Marland Kitchen.. 1 by mouth before breakfast, as needed 9)  Hydrochlorothiazide 12.5 Mg Caps (Hydrochlorothiazide) .Marland Kitchen.. 1 once daily  Patient Instructions: 1)  See  updated Med List  Prescriptions: HYDROCHLOROTHIAZIDE 12.5 MG CAPS (HYDROCHLOROTHIAZIDE) 1 once daily  #30 x 5   Entered and  Authorized by:   Marga Melnick MD   Signed by:   Marga Melnick MD on 05/22/2010   Method used:   Print then Give to Patient   RxID:   [id number]    Orders Added: 1)  Est. Patient Level IV [95621] 2)  Cardiology Referral [Cardiology] 3)  Venipuncture [30865] 4)  TLB-Creatinine, Blood [82565-CREA] 5)  TLB-Potassium (K+) [84132-K] 6)  TLB-BUN (Urea Nitrogen) [84520-BUN]  Appended Document: bp up/cbs

## 2010-08-14 NOTE — Assessment & Plan Note (Signed)
Summary: FOLLOW UP ON MEDS/CDJ   Vital Signs:  Patient Profile:   65 Years Old Male Weight:      233.6 pounds Pulse rate:   60 / minute Resp:     18 per minute BP sitting:   120 / 80  (left arm)  Pt. in pain?   no  Vitals Entered By: Shonna Chock (March 11, 2008 8:07 AM)                  Chief Complaint:  FOLLOW-UP ON MEDS.  History of Present Illness:  DMV requires physical exam.He was last seen @ [facility]iology for PAF; they do not manage lipids. Nuclear Stress Test was negative. The Vytorin dose is 1/2 of 10/20; 30 cost $125. Crestor would cost $25 / month.  Hypertension History:      He complains of palpitations, but denies headache, chest pain, dyspnea with exertion, orthopnea, PND, peripheral edema, visual symptoms, neurologic problems, syncope, and side effects from treatment.  He notes no problems with any antihypertensive medication side effects.  Further comments include: BP @ home 110/75 on average. Rare palpitations.        Positive major cardiovascular risk factors include male age 57 years old or older, hyperlipidemia, and hypertension.  Negative major cardiovascular risk factors include no history of diabetes, negative family history for ischemic heart disease, and non-tobacco-user status.        Further assessment for target organ damage reveals no history of ASHD, stroke/TIA, or peripheral vascular disease.    Lipid Management History:      Positive NCEP/ATP III risk factors include male age 21 years old or older, HDL cholesterol less than 40, and hypertension.  Negative NCEP/ATP III risk factors include non-diabetic, no family history for ischemic heart disease, non-tobacco-user status, no ASHD (atherosclerotic heart disease), no prior stroke/TIA, no peripheral vascular disease, and no history of aortic aneurysm.        Current Allergies (reviewed today): No known allergies   Past Medical History:    Hypertension    hepatitis age 49    Atrial  fibrillation, paroxysmal 12/07    Hyperlipidemia    Skin cancer, hx of basal cell    Spondylosis  @ L5-S1  Past Surgical History:    Vasectomy    T/A    Colonoscopy 2008 : neg, Dr Barnet Pall ; Cystoscopy 2004 for microscopic hematuria: negative    Colon polypectomy 2003    Concussion age 42   Family History:    daughter -hematuria, neg evaluation    Father:Alsheimer's, pacer, valve replacement     Mother: pacer    Siblings: neg    Puncle MI @ 39  Social History:    Never Smoked    Low fat diet   Risk Factors:  Alcohol use:  yes    Type:  1-2 /day Exercise:  yes    Times per week:  3    Type:  walks 30-45 min w/o symptoms  Family History Risk Factors:    Family History of MI in females < 46 years old:  no    Family History of MI in males < 52 years old:  no   Review of Systems  General      Denies fatigue, sleep disorder, and weight loss.  Eyes      Denies blurring, double vision, and vision loss-both eyes.  ENT      Denies difficulty swallowing and hoarseness.  CV  Denies bluish discoloration of lips or nails and leg cramps with exertion.  Resp      Denies cough and shortness of breath.  GI      Complains of indigestion.      Denies abdominal pain, bloody stools, and dark tarry stools.      TUMS as needed   GU      Denies dysuria, hematuria, and nocturia.  MS      Denies joint pain, low back pain, mid back pain, muscle aches, muscle weakness, and thoracic pain.  Derm      Denies changes in nail beds, dryness, and hair loss.      PMH basal cell  Neuro      Denies disturbances in coordination, numbness, poor balance, and tingling.  Psych      Denies anxiety and depression.  Endo      Denies cold intolerance, excessive hunger, excessive thirst, excessive urination, heat intolerance, polyuria, and weight change.  Heme      Denies abnormal bruising and bleeding.   Physical Exam  General:     well-nourished,in no acute distress;  alert,appropriate and cooperative throughout examination Head:     Normocephalic and atraumatic without obvious abnormalities. No apparent alopecia or balding. Eyes:     No corneal or conjunctival inflammation noted.Perrla. Funduscopic exam benign, without hemorrhages, exudates or papilledema. Ears:     External ear exam shows no significant lesions or deformities.  Otoscopic examination reveals clear canals, tympanic membranes are intact bilaterally without bulging, retraction, inflammation or discharge. Hearing is grossly normal bilaterally. Nose:     External nasal examination shows no deformity or inflammation. Nasal mucosa are pink and moist without lesions or exudatte.Slight septal dislocation Mouth:     Oral mucosa and oropharynx without lesions or exudates.  Teeth in good repair. pharynx pink and moist.   Neck:     No deformities, masses, or tenderness noted. Lungs:     Normal respiratory effort, chest expands symmetrically. Lungs are clear to auscultation, no crackles or wheezes. Heart:     Normal rate and regular rhythm. S1 and S2 normal without gallop, murmur, click, rub . S4 with slurring Abdomen:     Bowel sounds positive,abdomen soft and non-tender without masses, organomegaly or hernias noted. Rectal:     Given stool cards Genitalia:     Testes bilaterally descended without nodularity, tenderness or masses. No scrotal masses or lesions. No penis lesions or urethral discharge. Prostate:     PSA will be checked Pulses:     R and L carotid,radial,dorsalis pedis and posterior tibial pulses are full and equal bilaterally Extremities:     No clubbing, cyanosis, edema, or deformity noted . Neurologic:     cranial nerves II-XII intact and DTRs symmetrical and normal.   Skin:     6X37mm scaley lesion L maxilla Cervical Nodes:     No lymphadenopathy noted Axillary Nodes:     No palpable lymphadenopathy Psych:     memory intact for recent and remote, normally interactive,  good eye contact, not anxious appearing, and not depressed appearing.      Impression & Recommendations:  Problem # 1:  PHYSICAL EXAMINATION (ICD-V70.0)  Problem # 2:  ATRIAL FIBRILLATION (ICD-427.31) PMH of PAF X1 The following medications were removed from the medication list:    Toprol Xl 100 Mg Tb24 (Metoprolol succinate) .Marland Kitchen... 1 by mouth once daily as directed   Problem # 3:  HYPERLIPIDEMIA (ICD-272.4)  The following medications were removed from  the medication list:    Vytorin 10-20 Mg Tabs (Ezetimibe-simvastatin) .Marland Kitchen... 1/2 tab qd  His updated medication list for this problem includes:    Crestor 20 Mg Tabs (Rosuvastatin calcium) .Marland Kitchen... 1 qd   Problem # 4:  HYPERTENSION, ESSENTIAL NOS (ICD-401.9)  The following medications were removed from the medication list:    Toprol Xl 100 Mg Tb24 (Metoprolol succinate) .Marland Kitchen... 1 by mouth once daily as directed  His updated medication list for this problem includes:    Benazepril Hcl 20 Mg Tabs (Benazepril hcl) .Marland Kitchen... 1 tab qd    Amlodipine Besylate 10 Mg Tabs (Amlodipine besylate) .Marland Kitchen... 1 tab qd   Problem # 5:  HYPERPLASIA PROSTATE UNS W/O UR OBST & OTH LUTS (ICD-600.90)  Problem # 6:  SKIN CANCER, HX OF (ICD-V10.83) as per Derm  Problem # 7:  COLONIC POLYPS (ICD-211.3) PMH of  Complete Medication List: 1)  Metoprolol Xl 100mg   .... 1/2 qd 2)  Benazepril Hcl 20 Mg Tabs (Benazepril hcl) .Marland Kitchen.. 1 tab qd 3)  Amlodipine Besylate 10 Mg Tabs (Amlodipine besylate) .Marland Kitchen.. 1 tab qd 4)  Asa 325mg  Qd  5)  Niacin 500mg  Qd  6)  Fish Oil  7)  Multivitamin  8)  Crestor 20 Mg Tabs (Rosuvastatin calcium) .Marland Kitchen.. 1 qd  Hypertension Assessment/Plan:      The patient's hypertensive risk group is category B: At least one risk factor (excluding diabetes) with no target organ damage.  His calculated 10 year risk of coronary heart disease is 9 %.  Today's blood pressure is 120/80.    Lipid Assessment/Plan:      Based on NCEP/ATP III, the patient's  risk factor category is "2 or more risk factors and a calculated 10 year CAD risk of < 20%".  The patient's lipid goals have been set as follows: Total cholesterol goal is 200; LDL cholesterol goal is 75; HDL cholesterol goal is 40; Triglyceride goal is 150.  His LDL cholesterol goal has been met.     Patient Instructions: 1)  Complete stool cards. 2)  Please schedule a follow-up appointment in 3 months.Codes : 427.31, 211.3,272.4,401.9,600.9,995.20 3)  BMP prior to visit, ICD-9: 4)  Hepatic Panel prior to visit, ICD-9: 5)  Lipid Panel prior to visit, ICD-9: 6)  TSH prior to visit, ICD-9: 7)  CBC w/ Diff prior to visit, ICD-9: 8)  PSA prior to visit, ICD-9:   Prescriptions: AMLODIPINE BESYLATE 10 MG  TABS (AMLODIPINE BESYLATE) 1 tab qd  #90 x 3   Entered and Authorized by:   Marga Melnick MD   Signed by:   Marga Melnick MD on 03/11/2008   Method used:   Print then Give to Patient   RxID:   [id number] BENAZEPRIL HCL 20 MG  TABS (BENAZEPRIL HCL) 1 tab qd  #90 x 3   Entered and Authorized by:   Marga Melnick MD   Signed by:   Marga Melnick MD on 03/11/2008   Method used:   Print then Give to Patient   RxID:   [id number] CRESTOR 20 MG TABS (ROSUVASTATIN CALCIUM) 1 qd  #30 x 2   Entered and Authorized by:   Marga Melnick MD   Signed by:   Marga Melnick MD on 03/11/2008   Method used:   Print then Give to Patient   RxID:   [id number] METOPROLOL XL  100MG  1/2 qd  #30 x 5   Entered and Authorized by:   Marga Melnick MD   Signed by:  Marga Melnick MD on 03/11/2008   Method used:   Print then Give to Patient   RxID:   [id number]  ]

## 2010-08-14 NOTE — Assessment & Plan Note (Signed)
Summary: Shingles Vaccin/scm   Nurse Visit   Allergies: No Known Drug Allergies  Immunizations Administered:  Zostavax # 1:    Vaccine Type: Zostavax    Site: right deltoid    Mfr: Merck    Dose: 0.77mL    Route: IM    Given by: [employer]    Exp. Date: 07/29/2010    Lot #: [id number]  Orders Added: 1)  Zoster (Shingles) Vaccine Live [90736] 2)  Admin 1st Vaccine [16109]

## 2010-08-14 NOTE — Progress Notes (Signed)
Summary: PT WANTS RX FOR VYTORIN  Phone Note Call from Patient Call back at Home Phone [phone] Call back at [phone]   Caller: Patient Call For: TRIAGE Reason for Call: Refill Medication Summary of Call: DR HOPPER   PT WANTS A WRITTEN RX FOR VYTORIN//PLEASE CALL PT WHEN READY FOR PICK UP will pull chart and give to hop for approval   Kandice Hams  June  3, 20082:40 PM    Initial call taken by: Job Founds,  December 16, 2006 9:29 AM  Follow-up for Phone Call        Rx refilled Follow-up by: Marga Melnick MD,  December 17, 2006 6:23 PM

## 2010-08-14 NOTE — Miscellaneous (Signed)
Summary: Appointment Canceled  Appointment status changed to canceled by [employer] on 05/31/2010 10:51 AM.  Cancellation Comments --------------------- dobutamine echo  Appointment Information ----------------------- Appt Type:  CARDIOLOGY ANCILLARY VISIT      Date:  Wednesday, June 27, 2010      Time:  3:00 PM for 60 min   Urgency:  Routine   Made By:  Hoy Finlay Scheduler  To Visit:  [id number]    Reason:  dobutamine echo  Appt Comments ------------- -- 05/31/10 10:51: ([facility]) CANCELED -- dobutamine echo -- 05/31/10 9:53: ([facility]) BOOKED -- Routine CARDIOLOGY ANCILLARY VISIT at 06/27/2010 3:00 PM for 60 min dobutamine echo

## 2010-08-14 NOTE — Progress Notes (Signed)
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 Follow-up by: Marga Melnick MD,  May 22, 2007 6:20 PM  Additional Follow-up for Phone Call Additional follow up Details #1::        patient here to read ppd and have office visit Additional Follow-up by: Wendall Stade,  June 01, 2007 3:54 PM

## 2010-08-14 NOTE — Progress Notes (Signed)
Summary: RX REFILL METOPROLOL TODAY  Phone Note Call from Patient Call back at [phone]   Caller: Spouse Call For: Marga Melnick MD Reason for Call: Refill Medication Summary of Call: SPOUSE, BARBARA CALLING, STATES PT COMPLETELY OUT OF MED - METOPROLOL 100MG , 1/2 TAB EVERYDAY.  THEY ARE LEAVING TO GO OUT OF TOWN EARLY MORNING TOMORROW, 04-22-09, WANTS RX ASAP.   ****PLEASE CONTACT PRIOR TO SUBMITTING WITH QUESTIONS, BECAUSE WIFE IS STATING TO FILL RX FOR 30 TABLETS INSTEAD OF 30 DAY SUPPLY*****.  [facility] IS PHARMACY. Initial call taken by: Magdalen Spatz [facility],  April 21, 2009 2:16 PM  Follow-up for Phone Call        pt aware rx sent to pharmacy. F/U appt for med schedule.............Marland KitchenFelecia Deloach CMA  April 21, 2009 3:15 PM     Prescriptions: TOPROL XL 100 MG XR24H-TAB (METOPROLOL SUCCINATE) 1/2 by mouth once daily  #30 x 0   Entered by:   Jeremy Johann CMA   Authorized by:   Marga Melnick MD   Signed by:   Jeremy Johann CMA on 04/21/2009   Method used:   Electronically to        [facility] [phone]* (retail)       [address]       [place], [place]  [address]       Ph: [phone]       Fax: [phone]   RxID:   [id number]

## 2010-08-14 NOTE — Assessment & Plan Note (Signed)
Summary: to review labs   Vital Signs:  Patient Profile:   65 Years Old Male Weight:      138.6 pounds Pulse rate:   64 / minute Resp:     16 per minute BP sitting:   112 / 74  (left arm) Cuff size:   large  Vitals Entered By: Shonna Chock (June 15, 2008 10:29 AM)                 Chief Complaint:  FOLLOW-UP ON LABS.  History of Present Illness: Serial labs, HTN,& Lipid Management Panels  & risks reviewed.On Crestor 20 mg 3X/week; CVE decreased & increased intake of  "junk food". High glycemic index/load in diet & High Fructose Corn Syrup role in lipids & weight gain reviewed.  Hypertension History:      He complains of palpitations and peripheral edema, but denies headache, chest pain, dyspnea with exertion, orthopnea, PND, visual symptoms, neurologic problems, syncope, and side effects from treatment.  He notes no problems with any antihypertensive medication side effects.  Further comments include: Edema post standing; occa palpitations with stress or caffeine.        Positive major cardiovascular risk factors include male age 77 years old or older, hyperlipidemia, and hypertension.  Negative major cardiovascular risk factors include no history of diabetes, negative family history for ischemic heart disease, and non-tobacco-user status.        Further assessment for target organ damage reveals no history of ASHD, stroke/TIA, or peripheral vascular disease.    Lipid Management History:      Positive NCEP/ATP III risk factors include male age 49 years old or older, HDL cholesterol less than 40, and hypertension.  Negative NCEP/ATP III risk factors include non-diabetic, no family history for ischemic heart disease, non-tobacco-user status, no ASHD (atherosclerotic heart disease), no prior stroke/TIA, no peripheral vascular disease, and no history of aortic aneurysm.        Current Allergies (reviewed today): No known allergies   Past Medical History:    Hypertension  hepatitis age 12    Atrial fibrillation, paroxysmal 12/07    Hyperlipidemia    Skin cancer, hx of basal cell, Dr Gabriel Cirri    Spondylosis  @ L5-S1  Past Surgical History:    Reviewed history from 03/11/2008 and no changes required:       Vasectomy       T/A       Colonoscopy 2008 : neg, Dr Barnet Pall ; Cystoscopy 2004 for microscopic hematuria: negative       Colon polypectomy 2003       Concussion age 6     Review of Systems  General      Denies weight loss.  Eyes      Denies blurring, double vision, and vision loss-both eyes.  CV      Denies bluish discoloration of lips or nails and leg cramps with exertion.  Derm      Complains of lesion(s).      2 basal cells removed 10/09  Neuro      Denies disturbances in coordination, numbness, poor balance, and tingling.  Psych      Denies anxiety, depression, easily angered, easily tearful, and irritability.  Endo      Denies excessive hunger, excessive thirst, and excessive urination.   Physical Exam  General:     well-nourished,in no acute distress; alert,appropriate and cooperative throughout examination Neck:     No deformities, masses, or tenderness noted. Lungs:  Normal respiratory effort, chest expands symmetrically. Lungs are clear to auscultation, no crackles or wheezes. Heart:     Normal rate and regular rhythm. S1 and S2 normal without gallop, murmur, click, rub. S4 with slurring Abdomen:     Bowel sounds positive,abdomen soft and non-tender without masses, organomegaly or hernias noted. Pulses:     R and L carotid,radial,dorsalis pedis and posterior tibial pulses are full and equal bilaterally Extremities:     Fungal  toenail changes Neurologic:     alert & oriented X3 and DTRs symmetrical and normal.   Skin:     Intact without suspicious lesions or rashes Psych:     memory intact for recent and remote, normally interactive, good eye contact, not anxious appearing, and not depressed appearing.       Impression & Recommendations:  Problem # 1:  HYPERLIPIDEMIA (ICD-272.4) TLC elected rather than increased Crestor His updated medication list for this problem includes:    Crestor 20 Mg Tabs (Rosuvastatin calcium) .Marland Kitchen... 1 once daily mon, weds, fri   Problem # 2:  HYPERTENSION, ESSENTIAL NOS (ICD-401.9)  His updated medication list for this problem includes:    Toprol Xl 100 Mg Xr24h-tab (Metoprolol succinate) .Marland Kitchen... 1/2 by mouth once daily    Benazepril Hcl 20 Mg Tabs (Benazepril hcl) .Marland Kitchen... 1 tab qd    Amlodipine Besylate 10 Mg Tabs (Amlodipine besylate) .Marland Kitchen... 1 tab qd   Problem # 3:  ATRIAL FIBRILLATION (ICD-427.31) Quiescent His updated medication list for this problem includes:    Toprol Xl 100 Mg Xr24h-tab (Metoprolol succinate) .Marland Kitchen... 1/2 by mouth once daily   Problem # 4:  SKIN CANCER, HX OF (ICD-V10.83) as per Dr Stefanie Libel; annual evaluation recommended  Problem # 5:  HYPERPLASIA PROSTATE UNS W/O UR OBST & OTH LUTS (ICD-600.90)  Complete Medication List: 1)  Toprol Xl 100 Mg Xr24h-tab (Metoprolol succinate) .... 1/2 by mouth once daily 2)  Benazepril Hcl 20 Mg Tabs (Benazepril hcl) .Marland Kitchen.. 1 tab qd 3)  Amlodipine Besylate 10 Mg Tabs (Amlodipine besylate) .Marland Kitchen.. 1 tab qd 4)  Asa 325mg  Qd  5)  Niacin 500mg  Qd  6)  Fish Oil  7)  Multivitamin  8)  Crestor 20 Mg Tabs (Rosuvastatin calcium) .Marland Kitchen.. 1 once daily mon, weds, fri 9)  Clarinex 5 Mg Tabs (Desloratadine) .Marland Kitchen.. 1 by mouth as needed  Hypertension Assessment/Plan:      The patient's hypertensive risk group is category B: At least one risk factor (excluding diabetes) with no target organ damage.  His calculated 10 year risk of coronary heart disease is 9 %.  Today's blood pressure is 112/74.    Lipid Assessment/Plan:      Based on NCEP/ATP III, the patient's risk factor category is "2 or more risk factors and a calculated 10 year CAD risk of < 20%".  The patient's lipid goals have been set as follows: Total cholesterol  goal is 200; LDL cholesterol goal is 100; HDL cholesterol goal is 40; Triglyceride goal is 150.  His LDL cholesterol goal has been met.     Patient Instructions: 1)  Follow a low carb program ([employer] or [place]) & CVE 3-4X/week 30-45 min/ session (TLC rather than increased meds). 2)  Please schedule a follow-up appointment in 1 year.Codes : 272.4,995.20,427.31,401.9,600.90 3)  BMP prior to visit, ICD-9: 4)  Hepatic Panel prior to visit, ICD-9: 5)  Lipid Panel prior to visit, ICD-9: 6)  TSH prior to visit, ICD-9: 7)  CBC w/ Diff prior  to visit, ICD-9: 8)  PSA prior to visit, ICD-9:   ]

## 2010-08-14 NOTE — Progress Notes (Signed)
Summary: REFILL BENAZEPRIL  Phone Note Call from Patient Call back at [phone]   Caller: Spouse Call For: Rodney Melnick MD Summary of Call: PATIENT SPOUSE, BARBARA, CALLING TO REQUEST RX FOR PT'S BENAZEPRIL 20MG , 1 TABLET, TWICE DAILY, 90 DAY SUPPLY.  SHE WILL COME BY OUR OFFICE TO PICK-UP RX.  PLEASE CALL HER WHEN READY @ [phone]. Initial call taken by: Magdalen Spatz [facility],  July 27, 2009 2:03 PM    New/Updated Medications: BENAZEPRIL HCL 20 MG  TABS (BENAZEPRIL HCL) 1 by mouth two times a day Prescriptions: BENAZEPRIL HCL 20 MG  TABS (BENAZEPRIL HCL) 1 by mouth two times a day  #180 x 3   Entered by:   Shonna Chock   Authorized by:   Rodney Melnick MD   Signed by:   Shonna Chock on 07/27/2009   Method used:   Print then Give to Patient   RxID:   [id number]

## 2010-08-14 NOTE — Progress Notes (Signed)
Summary: Refill request  Phone Note Refill Request Message from:  Patient  Refills Requested: Medication #1:  CARDIZEM CD 180 MG XR24H-CAP 1 by mouth once daily    Prescriptions: CARDIZEM CD 180 MG XR24H-CAP (DILTIAZEM HCL COATED BEADS) 1 by mouth once daily  #30 x 5   Entered by:   Shonna Chock   Authorized by:   Marga Melnick MD   Signed by:   Shonna Chock on 10/12/2009   Method used:   Faxed to ...       [facility] [address] (retail)       [address]       [place], [place]  [address]       Ph: [phone]       Fax: [phone]   RxID:   [id number]

## 2010-08-14 NOTE — Letter (Signed)
Summary: Physical Exam Report/[facility]  Physical Exam Report/[facility]   Imported By: Lanelle Bal 03/16/2008 09:15:04  _____________________________________________________________________  External Attachment:    Type:   Image     Comment:   External Document

## 2010-08-14 NOTE — Consult Note (Signed)
Summary: [facility]  [facility]   Imported By: Freddy Jaksch 08/14/2007 11:23:23  _____________________________________________________________________  External Attachment:    Type:   Image     Comment:   External Document

## 2010-08-14 NOTE — Letter (Signed)
Summary: Health Assessment/[facility]  Health Assessment/[facility]   Imported By: Lanelle Bal 04/09/2010 13:02:26  _____________________________________________________________________  External Attachment:    Type:   Image     Comment:   External Document

## 2010-08-14 NOTE — Miscellaneous (Signed)
Summary: Appointment Canceled  Appointment status changed to canceled by [employer] on 05/31/2010 10:49 AM.  Cancellation Comments --------------------- dobutamine echo with vanessa/sl  Appointment Information ----------------------- Appt Type:  EMR NO DOCUMENT      Date:  Wednesday, June 27, 2010      Time:  3:00 PM for 30 min   Urgency:  Routine   Made By:  Hoy Finlay Scheduler  To Visit:  [id number]    Reason:  dobutamine echo with vanessa/sl  Appt Comments ------------- -- 05/31/10 10:49: (CEMR) CANCELED -- dobutamine echo with vanessa/sl -- 05/31/10 9:52: ([facility]EM[facility]) BOOKED -- Routine EMR NO DOCUMENT at 06/27/2010 3:00 PM for 30 min dobutamine echo with vanessa/sl

## 2010-08-14 NOTE — Assessment & Plan Note (Signed)
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 in pain?   no  Vitals Entered By: Wendall Stade (June 01, 2007 3:50 PM)                  Chief Complaint:  complete school form.  Hypertension History:      He denies headache, chest pain, palpitations, dyspnea with exertion, orthopnea, PND, peripheral edema, visual symptoms, neurologic problems, syncope, and side effects from treatment.  He notes no problems with any antihypertensive medication side effects.  Further comments include: BP < 120/70.        Positive major cardiovascular risk factors include male age 38 years old or older and hypertension.  Negative major cardiovascular risk factors include non-tobacco-user status.     Current Allergies (reviewed today): No known allergies       Physical Exam  General:     Well-developed,well-nourished,in no acute distress; alert,appropriate and cooperative throughout examination Lungs:     Normal respiratory effort, chest expands symmetrically. Lungs are clear to auscultation, no crackles or wheezes. Heart:     Normal rate and regular rhythm. S1 and S2 normal without gallop, murmur, click, rub. S4 with slurring Abdomen:     Bowel sounds positive,abdomen soft and non-tender without masses, organomegaly or hernias noted. Pulses:     R and L carotid,radial,dorsalis pedis and posterior tibial pulses are full and equal bilaterally Neurologic:     alert & oriented X3.      Impression & Recommendations:  Problem # 1:  HYPERTENSION, ESSENTIAL NOS (ICD-401.9)  His updated medication list for this problem includes:    Metoprolol Tartrate 50 Mg Tabs (Metoprolol tartrate) .Marland Kitchen... Take 1 qd    Benazepril Hcl 20 Mg Tabs (Benazepril hcl) .Marland Kitchen... 1/2 tab qd    Amlodipine  Besylate 10 Mg Tabs (Amlodipine besylate) .Marland Kitchen... 1/2 tab qd   Complete Medication List: 1)  Metoprolol Tartrate 50 Mg Tabs (Metoprolol tartrate) .... Take 1 qd 2)  Benazepril Hcl 20 Mg Tabs (Benazepril hcl) .... 1/2 tab qd 3)  Amlodipine Besylate 10 Mg Tabs (Amlodipine besylate) .... 1/2 tab qd 4)  Vytorin 10-20 Mg Tabs (Ezetimibe-simvastatin) .... 1/2 tab qd 5)  Asa 325mg  Qd  6)  Niacin 500mg  Qd  7)  Fish Oil  8)  Multivitamin   Hypertension Assessment/Plan:      The patient's hypertensive risk group is category B: At least one risk factor (excluding diabetes) with no target organ damage.  His calculated 10 year risk of coronary heart disease is 9 %.  Today's blood pressure is 120/70.     Patient Instructions: 1)  BP goal = average < 130/85.    Prescriptions: METOPROLOL TARTRATE 50 MG  TABS (METOPROLOL TARTRATE) TAKE 1 QD  #30 x 11   Entered and Authorized by:   Marga Melnick MD   Signed by:   Marga Melnick MD on 06/01/2007   Method used:   Print then Give to Patient   RxID:   [id number] AMLODIPINE BESYLATE 10 MG  TABS (AMLODIPINE BESYLATE) 1/2 tab qd  #30 x 6   Entered and Authorized by:   Marga Melnick MD   Signed by:   Marga Melnick MD  on 06/01/2007   Method used:   Print then Give to Patient   RxID:   [id number] BENAZEPRIL HCL 20 MG  TABS (BENAZEPRIL HCL) 1/2 tab qd  #30 x 6   Entered and Authorized by:   Marga Melnick MD   Signed by:   Marga Melnick MD on 06/01/2007   Method used:   Print then Give to Patient   RxID:   [id number]  ]

## 2010-08-14 NOTE — Progress Notes (Signed)
Summary: NEEDS PRESCRIP FOR GENERIC TOPROL RE-WRITTEN  Phone Note Call from Patient Call back at Home Phone [phone] Call back at CELL - [phone]   Caller: Patient Action Taken: Rx Called In Summary of Call: PATIENT SENT IN TYPED NOTE FOR DR HOPPER--STATES THAT HE IS HAVING TROUBLE FINDING GENERIC FOR TOPROL XL 50 MG TIMED RELEASED--HAD TO REFILL USING NON-GENERIC  SAID HE CALLED [facility] AND AT THAT TIME, THEY HAD TOPROL XL 100 MG, GENERIC (METROPROLOL) IN STOCK--PATIENT WANTS TO KNOW IF DR HOPPER COULD REWRITE PRESCRIPTION FOR "TOPROL XL 100 MG, GENERIC, 30 TABLETS PER MONTH" AND HE WOULD CUT THEM IN HALF  STATES THAT HE IS STILL UNEMPLOYED AND THAT COBRA AND FAMILY DEDUCTIBLE PER YEAR ARE QUITE HIGH  SAYS HE OR HIS WIFE WILL PICK UP PRESCRIPTION SO THEY CAN SHOP AROUND FOR BEST PRICE  WILL PUT LETTER IN PLASTIC SLEEVE AND TAKE TO KATHY'S DESK Initial call taken by: Jerolyn Shin,  October 21, 2007 3:46 PM  Follow-up for Phone Call        DR/HOPPER PLEASE ADVISE Follow-up by: Shonna Chock,  October 21, 2007 3:53 PM  Additional Follow-up for Phone Call Additional follow up Details #1::        OK #30, refill X3 Additional Follow-up by: Marga Melnick MD,  October 22, 2007 10:16 AM    Additional Follow-up for Phone Call Additional follow up Details #2::    Called pt informed RX ready for pickup...................................................................Marland KitchenKandice Hams  October 22, 2007 2:22 PM  Follow-up by: Kandice Hams,  October 22, 2007 2:22 PM  New/Updated Medications: TOPROL XL 100 MG  TB24 (METOPROLOL SUCCINATE) 1 by mouth once daily as directed   Prescriptions: TOPROL XL 100 MG  TB24 (METOPROLOL SUCCINATE) 1 by mouth once daily as directed  #30 x 3   Entered by:   Kandice Hams   Authorized by:   Marga Melnick MD   Signed by:   Kandice Hams on 10/22/2007   Method used:   Print then Give to Patient   RxID:   [id number]

## 2010-08-14 NOTE — Procedures (Signed)
Summary: Gastroenterology  Gastroenterology   Imported By: Freddy Jaksch 01/19/2007 15:51:14  _____________________________________________________________________  External Attachment:    Type:   Image     Comment:   COLONOSCOPY

## 2010-08-14 NOTE — Letter (Signed)
Summary: Letter from Patient Regarding Meds  Letter from Patient Regarding Meds   Imported By: Lanelle Bal 01/26/2010 09:41:42  _____________________________________________________________________  External Attachment:    Type:   Image     Comment:   External Document

## 2010-08-14 NOTE — Miscellaneous (Signed)
Summary: Dobut. Echo  Clinical Lists Changes  Problems: Added new problem of SHORTNESS OF BREATH (ICD-786.05) Orders: Added new Referral order of Dobutamine Echo (Dobutamine Echo) - Signed

## 2010-08-14 NOTE — Progress Notes (Signed)
Summary: questions about medication  Phone Note Call from Patient   Caller: Spouse Call For: Britta Mccreedy Summary of Call: Dr. Alwyn Ren  Call Back [phone] ( Barbara's cell)  Pt was seen on August 28 and he forgot to ask for samples of Crestor, Dr. Drue Novel wanted him to finish taking the lipitor first but he want to know if he can start sooner. Crestor 40mg  because the pt want to cut the pill in half. pt want the samples because this will be his first time taking the medication. Initial call taken by: Vanessa [place],  March 29, 2008 2:13 PM  Follow-up for Phone Call        Spoke with patient's wife-patient is currently taking Vytorin and is ready to change. Patient was told to finish Vytorin then change. Patient has taken Vytorin x years now and having muscle aches, then wife states muscle aches probably not related to Vytoin-patient is just tired. Patients wife would like to know  #1-Can he have samples of Crestor 20mg -forgot to ask when he was here (placed samples @ the front)  #2-Can Dr.Hopper Re-write RX for 40mg  and have patient cut pill in half daily. For money saving purposes. Ok for Dr.Hopper to address on Monday (per wife) Follow-up by: Shonna Chock,  March 29, 2008 3:26 PM  Additional Follow-up for Phone Call Additional follow up Details #1::        Give him Crestor coupon for  #30 free  to take 1 pill M,W, F with fasting lipids, GOT,GPT,CPK after 10 weeks( 272.4,995.20) Additional Follow-up by: Marga Melnick MD,  March 29, 2008 8:51 PM    Additional Follow-up for Phone Call Additional follow up Details #2::    Left detailed message for patient that samples up front and if any questions to contact office, also needs labs in 10 weeks, to please call the office. Ardyth Man  April 01, 2008 1:24 PM  Follow-up by: Ardyth Man,  April 01, 2008 1:24 PM

## 2010-08-14 NOTE — Progress Notes (Signed)
Summary: [facility]  Phone Note Refill Request Message from:  Pharmacy  Refills Requested: Medication #1:  CARDIZEM CD 180 MG XR24H-CAP 1 by mouth once daily  Medication #2:  BENAZEPRIL HCL 20 MG  TABS 1 by mouth two times a day [facility]   Method Requested: Electronic Initial call taken by: Shonna Chock,  January 09, 2010 11:13 AM    Prescriptions: BENAZEPRIL HCL 20 MG  TABS (BENAZEPRIL HCL) 1 by mouth two times a day  #180 x 1   Entered by:   Shonna Chock   Authorized by:   Marga Melnick MD   Signed by:   Shonna Chock on 01/09/2010   Method used:   Faxed to ...       [facility] (mail-order)             , [place]         Ph: [phone]       Fax: [phone]   RxID:   [id number] CARDIZEM CD 180 MG XR24H-CAP (DILTIAZEM HCL COATED BEADS) 1 by mouth once daily  #90 x 1   Entered by:   Shonna Chock   Authorized by:   Marga Melnick MD   Signed by:   Shonna Chock on 01/09/2010   Method used:   Faxed to ...       [facility] (mail-order)             , [place]         Ph: [phone]       Fax: [phone]   RxID:   [id number]

## 2010-09-06 ENCOUNTER — Encounter: Payer: Self-pay | Admitting: Internal Medicine

## 2010-09-12 ENCOUNTER — Encounter (INDEPENDENT_AMBULATORY_CARE_PROVIDER_SITE_OTHER): Payer: Medicare Other | Admitting: Internal Medicine

## 2010-09-12 ENCOUNTER — Encounter: Payer: Self-pay | Admitting: Internal Medicine

## 2010-09-12 DIAGNOSIS — I10 Essential (primary) hypertension: Secondary | ICD-10-CM

## 2010-09-12 DIAGNOSIS — Z860100 Personal history of colon polyps, unspecified: Secondary | ICD-10-CM | POA: Insufficient documentation

## 2010-09-12 DIAGNOSIS — D126 Benign neoplasm of colon, unspecified: Secondary | ICD-10-CM

## 2010-09-12 DIAGNOSIS — I4891 Unspecified atrial fibrillation: Secondary | ICD-10-CM

## 2010-09-12 DIAGNOSIS — E785 Hyperlipidemia, unspecified: Secondary | ICD-10-CM

## 2010-09-12 DIAGNOSIS — Z0000 Encounter for general adult medical examination without abnormal findings: Secondary | ICD-10-CM

## 2010-09-14 ENCOUNTER — Telehealth (INDEPENDENT_AMBULATORY_CARE_PROVIDER_SITE_OTHER): Payer: Self-pay | Admitting: *Deleted

## 2010-09-17 ENCOUNTER — Other Ambulatory Visit (INDEPENDENT_AMBULATORY_CARE_PROVIDER_SITE_OTHER): Payer: Medicare Other

## 2010-09-17 ENCOUNTER — Other Ambulatory Visit: Payer: Self-pay | Admitting: Internal Medicine

## 2010-09-17 ENCOUNTER — Encounter (INDEPENDENT_AMBULATORY_CARE_PROVIDER_SITE_OTHER): Payer: Self-pay | Admitting: *Deleted

## 2010-09-17 DIAGNOSIS — I4891 Unspecified atrial fibrillation: Secondary | ICD-10-CM

## 2010-09-17 DIAGNOSIS — I10 Essential (primary) hypertension: Secondary | ICD-10-CM

## 2010-09-17 DIAGNOSIS — R7309 Other abnormal glucose: Secondary | ICD-10-CM

## 2010-09-17 DIAGNOSIS — T887XXA Unspecified adverse effect of drug or medicament, initial encounter: Secondary | ICD-10-CM

## 2010-09-17 DIAGNOSIS — E785 Hyperlipidemia, unspecified: Secondary | ICD-10-CM

## 2010-09-17 LAB — CBC WITH DIFFERENTIAL/PLATELET
Basophils Absolute: 0 10*3/uL (ref 0.0–0.1)
Basophils Relative: 0.6 % (ref 0.0–3.0)
Eosinophils Absolute: 0.3 10*3/uL (ref 0.0–0.7)
Eosinophils Relative: 3.9 % (ref 0.0–5.0)
HCT: 42.9 % (ref 39.0–52.0)
Hemoglobin: 15 g/dL (ref 13.0–17.0)
Lymphocytes Relative: 29.5 % (ref 12.0–46.0)
Lymphs Abs: 2.3 10*3/uL (ref 0.7–4.0)
MCHC: 34.9 g/dL (ref 30.0–36.0)
MCV: 92.8 fl (ref 78.0–100.0)
Monocytes Absolute: 0.7 10*3/uL (ref 0.1–1.0)
Monocytes Relative: 8.7 % (ref 3.0–12.0)
Neutro Abs: 4.5 10*3/uL (ref 1.4–7.7)
Neutrophils Relative %: 57.3 % (ref 43.0–77.0)
Platelets: 245 10*3/uL (ref 150.0–400.0)
RBC: 4.63 Mil/uL (ref 4.22–5.81)
RDW: 12.9 % (ref 11.5–14.6)
WBC: 7.8 10*3/uL (ref 4.5–10.5)

## 2010-09-17 LAB — HEPATIC FUNCTION PANEL
ALT: 16 U/L (ref 0–53)
AST: 18 U/L (ref 0–37)
Albumin: 3.9 g/dL (ref 3.5–5.2)
Alkaline Phosphatase: 40 U/L (ref 39–117)
Bilirubin, Direct: 0.2 mg/dL (ref 0.0–0.3)
Total Bilirubin: 0.8 mg/dL (ref 0.3–1.2)
Total Protein: 6.6 g/dL (ref 6.0–8.3)

## 2010-09-17 LAB — LIPID PANEL
Cholesterol: 138 mg/dL (ref 0–200)
HDL: 33.5 mg/dL — ABNORMAL LOW (ref >39.00)
LDL Cholesterol: 72 mg/dL (ref 0–99)
Total CHOL/HDL Ratio: 4
Triglycerides: 164 mg/dL — ABNORMAL HIGH (ref 0.0–149.0)
VLDL: 32.8 mg/dL (ref 0.0–40.0)

## 2010-09-17 LAB — BASIC METABOLIC PANEL
BUN: 23 mg/dL (ref 6–23)
CO2: 28 mEq/L (ref 19–32)
Calcium: 8.8 mg/dL (ref 8.4–10.5)
Chloride: 103 mEq/L (ref 96–112)
Creatinine, Ser: 1.2 mg/dL (ref 0.4–1.5)
GFR: 64.43 mL/min (ref >60.00)
Glucose, Bld: 107 mg/dL — ABNORMAL HIGH (ref 70–99)
Potassium: 4 mEq/L (ref 3.5–5.1)
Sodium: 138 mEq/L (ref 135–145)

## 2010-09-17 LAB — TSH: TSH: 1.01 u[IU]/mL (ref 0.35–5.50)

## 2010-09-17 LAB — HEMOGLOBIN A1C: Hgb A1c MFr Bld: 5.9 % (ref 4.6–6.5)

## 2010-09-19 ENCOUNTER — Telehealth (INDEPENDENT_AMBULATORY_CARE_PROVIDER_SITE_OTHER): Payer: Self-pay | Admitting: *Deleted

## 2010-09-20 ENCOUNTER — Telehealth (INDEPENDENT_AMBULATORY_CARE_PROVIDER_SITE_OTHER): Payer: Self-pay | Admitting: *Deleted

## 2010-09-20 NOTE — Assessment & Plan Note (Signed)
Summary: complete physical   Vital Signs:  Patient profile:   65 year old male Height:      71.5 inches Weight:      244.4 pounds BMI:     33.73 Temp:     97.7 degrees F oral Pulse rate:   56 / minute Resp:     14 per minute BP sitting:   114 / 80  (left arm) Cuff size:   large  Vitals Entered By: Shonna Chock CMA (September 12, 2010 12:53 PM) CC: CPX, ekg completed by Cardiology. Renew meds  Comments Patient refused pneumonia vaccine  Vision Screening:Left eye with correction: 20 / 40 Right eye with correction: 20 / 40 Both eyes with correction: 20 / 30       Vision Comments: Patient wears reading glasses when needed  Vision Entered By: Shonna Chock CMA (September 12, 2010 1:01 PM)   Primary Care Provider:  Marga Melnick MD  CC:  CPX and ekg completed by Cardiology. Renew meds .  History of Present Illness: Here for Medicare AWV: 1.Risk factors based on Past M, S, F history:see Diagnoses; chart updated 2.Physical Activities: no regular program 3.Depression/mood:no issues  4.Hearing: whisper heard @ 6 ft 5.ADL's: no limitations 6.Fall Risk: none 7.Home Safety: no issues 8.Height, weight, &visual acuity:see VS 9.Counseling: POA & Living Will up to date 10.Labs ordered based on risk factors: will be ordered after chart review 11. Referral Coordination: polyp survelliance up to date  as per Dr Arlyce Dice; Cardiology monitor on schedule(seen 06/2010) 12. Care Plan: see Instructions 13.Cognitive Assessment: Oriented X 3; memory & recall excellent   ;"WORLD" spelled backwards; mood & affect normal.    Hypertension Follow-Up: He  denies light ( no regular exercise), dyspnea, palpitations, and syncope.  Compliance with medications (by patient report) has been near 100%.  The patient reports that dietary compliance has been fair.  The patient reports no exercise.  Adjunctive measures currently used by the patient include salt restriction. BP 120-125/70-80 @ home. Stress ECHO  06/2010.    Hyperlipidemia Follow-Up:He reports constipation, but denies muscle aches, GI upset, abdominal pain, flushing, itching, and diarrhea.  Compliance with medications (by patient report) has been near 100%.  Adjunctive measures currently used by the patient include fiber, ASA, and fish oil supplements.    Preventive Screening-Counseling & Management  Alcohol-Tobacco     Alcohol drinks/day: 1-2     Smoking Status: never  Caffeine-Diet-Exercise     Caffeine use/day: coffee 1 cup; 1 cup tea; 1 diet coke  Hep-HIV-STD-Contraception     Dental Visit-last 6 months annually     Sun Exposure-Excessive: no  Safety-Violence-Falls     Seat Belt Use: yes     Smoke Detectors: yes      Blood Transfusions:  no.        Travel History:  [place] 1982.    Current Medications (verified): 1)  Toprol Xl 50 Mg Xr24h-Tab (Metoprolol Succinate) .Marland Kitchen.. 1 By Mouth Two Times A Day 2)  Benazepril Hcl 20 Mg  Tabs (Benazepril Hcl) .Marland Kitchen.. 1 By Mouth Two Times A Day 3)  Cardizem Cd 180 Mg Xr24h-Cap (Diltiazem Hcl Coated Beads) .Marland Kitchen.. 1 By Mouth  Two Times A Day 4)  Asa 325mg  Qd 5)  Fish Oil 6)  Multivitamin 7)  Crestor 20 Mg Tabs (Rosuvastatin Calcium) .... Once Daily As Directed 8)  Omeprazole 20 Mg Tbec (Omeprazole) .Marland Kitchen.. 1 By Mouth Before Breakfast, As Needed 9)  Hydrochlorothiazide 12.5 Mg Caps (Hydrochlorothiazide) .Marland KitchenMarland KitchenMarland Kitchen 1  Once Daily  Allergies: No Known Drug Allergies  Past History:  Past Medical History: Hypertension Hepatitis ? B  @  age 71 Atrial fibrillation, paroxysmal 06/2006, 03/2009(previously saw Dr Harriette Bouillon; now seeing Dr Antoine Poche) Hyperlipidemia Skin cancer, PMH of basal cell, Dr Gabriel Cirri Spondylosis  @ L5-S1with Radiculopathy Colonic polyps, PMH  of 2003  Past Surgical History: Vasectomy T/A Colonoscopy 2008 : negative , Dr Barnet Pall (due 2018) ; Cystoscopy 2004 for microscopic hematuria: negative Colon polypectomy 2003 Concussion  @ age 65  Family  History: Daughter:hematuria, negative  evaluation Father:Alsheimer's, pacer, valve replacement  Mother: pacer Siblings: negative  Puncle MI: @ 84; no premature CAD in FH  Social History: Never Smoked Low fat diet Occupation: [employer] (part time) Married Alcohol use-yes Regular exercise-no Caffeine use/day:  coffee 1 cup; 1 cup tea; 1 diet coke Dental Care w/in 6 mos.:  annually Sun Exposure-Excessive:  no Seat Belt Use:  yes Blood Transfusions:  no  Physical Exam  General:  well-nourished;alert,appropriate and cooperative throughout examination Head:  Normocephalic and atraumatic without obvious abnormalities.  Eyes:  No corneal or conjunctival inflammation noted. Perrla. Funduscopic exam benign, without hemorrhages, exudates or papilledema.  Ears:  External ear exam shows no significant lesions or deformities.  Otoscopic examination reveals clear canals, tympanic membranes are intact bilaterally without bulging, retraction, inflammation or discharge. Hearing is grossly normal bilaterally. Nose:  External nasal examination shows no deformity or inflammation. Nasal mucosa are pink and moist without lesions or exudates. Mouth:  Oral mucosa and oropharynx without lesions or exudates.  Teeth in good repair. Neck:  No deformities, masses, or tenderness noted. Lungs:  Normal respiratory effort, chest expands symmetrically. Lungs are clear to auscultation, no crackles or wheezes. Heart:  regular rhythm, no murmur, no gallop, no rub, no JVD, no HJR, and bradycardia.   Abdomen:  Bowel sounds positive,abdomen soft and non-tender without masses, organomegaly or hernias noted. Rectal:  No external abnormalities noted. Normal sphincter tone. No rectal masses or tenderness. Genitalia:  Testes bilaterally descended without nodularity, tenderness or masses. No scrotal masses or lesions. No penis lesions or urethral discharge. L varicocele.   Prostate:  Prostate gland firm and smooth, no  enlargement, nodularity, tenderness, mass, asymmetry or induration. Msk:  No deformity or scoliosis noted of thoracic or lumbar spine.   Pulses:  R and L carotid,radial,dorsalis pedis and posterior tibial pulses are full and equal bilaterally Extremities:  trace left  & R pedal edema.   Neurologic:  alert & oriented X3 and DTRs symmetrical and normal.   Skin:  Intact without suspicious lesions or rashes Cervical Nodes:  No lymphadenopathy noted Axillary Nodes:  No palpable lymphadenopathy Inguinal Nodes:  No significant adenopathy Psych:  memory intact for recent and remote, jovial & interactive, and good eye contact.     Impression & Recommendations:  Problem # 1:  PREVENTIVE HEALTH CARE (ICD-V70.0)  Orders: Medicare -1st Annual Wellness Visit [phone])  Problem # 2:  HYPERTENSION, ESSENTIAL NOS (ICD-401.9)  The following medications were removed from the medication list:    Toprol Xl 50 Mg Xr24h-tab (Metoprolol succinate) .Marland Kitchen... 1 by mouth two times a day His updated medication list for this problem includes:    Benazepril Hcl 20 Mg Tabs (Benazepril hcl) .Marland Kitchen... 1 by mouth two times a day    Cardizem Cd 180 Mg Xr24h-cap (Diltiazem hcl coated beads) .Marland Kitchen... 1 by mouth  two times a day    Hydrochlorothiazide 12.5 Mg Caps (Hydrochlorothiazide) .Marland Kitchen... 1 once daily  Metoprolol Tartrate 50 Mg Tabs (Metoprolol tartrate) .Marland Kitchen... 1 two times a day  Problem # 3:  HYPERLIPIDEMIA (ICD-272.4)  His updated medication list for this problem includes:    Crestor 20 Mg Tabs (Rosuvastatin calcium) ..... Once daily as directed  Problem # 4:  COLONIC POLYPS (ICD-211.3) as per GI, due 2018  Problem # 5:  ATRIAL FIBRILLATION (ICD-427.31) as per Dr Antoine Poche The following medications were removed from the medication list:    Toprol Xl 50 Mg Xr24h-tab (Metoprolol succinate) .Marland Kitchen... 1 by mouth two times a day His updated medication list for this problem includes:    Cardizem Cd 180 Mg Xr24h-cap (Diltiazem hcl  coated beads) .Marland Kitchen... 1 by mouth  two times a day    Metoprolol Tartrate 50 Mg Tabs (Metoprolol tartrate) .Marland Kitchen... 1 two times a day  Complete Medication List: 1)  Benazepril Hcl 20 Mg Tabs (Benazepril hcl) .Marland Kitchen.. 1 by mouth two times a day 2)  Cardizem Cd 180 Mg Xr24h-cap (Diltiazem hcl coated beads) .Marland Kitchen.. 1 by mouth  two times a day 3)  Asa 325mg  Qd  4)  Fish Oil  5)  Multivitamin  6)  Crestor 20 Mg Tabs (Rosuvastatin calcium) .... Once daily as directed 7)  Omeprazole 20 Mg Tbec (Omeprazole) .Marland Kitchen.. 1 by mouth before breakfast, as needed 8)  Hydrochlorothiazide 12.5 Mg Caps (Hydrochlorothiazide) .Marland Kitchen.. 1 once daily 9)  Metoprolol Tartrate 50 Mg Tabs (Metoprolol tartrate) .Marland Kitchen.. 1 two times a day  Patient Instructions: 1)  Unless your Cardiologist states otherwise, it  is important that you exercise regularly at least 20 minutes 5 times a week. If you develop chest pain, have severe difficulty breathing, or feel very tired , stop exercising immediately and seek medical attention.Please schedule fasting labs: 2)  BMP , ICD-9:401.9 3)  Hepatic Panel, ICD-9:995.20 4)  Lipid Panel , ICD-9:272.4 5)  TSH , ICD-9:427.31 6)  CBC w/ Diff, ICD-9:211.3 7)  HbgA1C , ICD-9:790.29 Prescriptions: HYDROCHLOROTHIAZIDE 12.5 MG CAPS (HYDROCHLOROTHIAZIDE) 1 once daily  #90 x 1   Entered and Authorized by:   Marga Melnick MD   Signed by:   Marga Melnick MD on 09/12/2010   Method used:   Faxed to ...       [facility] (mail-order)             , [place]         Ph: [phone]       Fax: [phone]   RxID:   [id number] BENAZEPRIL HCL 20 MG  TABS (BENAZEPRIL HCL) 1 by mouth two times a day  #180 x 3   Entered and Authorized by:   Marga Melnick MD   Signed by:   Marga Melnick MD on 09/12/2010   Method used:   Faxed to ...       [facility] (mail-order)             , [place]         Ph: [phone]       Fax: [phone]   RxID:   [id number] CARDIZEM CD 180 MG XR24H-CAP (DILTIAZEM HCL COATED BEADS) 1 by mouth   two times a day  #180 x 3   Entered and Authorized by:   Marga Melnick MD   Signed by:   Marga Melnick MD on 09/12/2010   Method used:   Faxed to ...       [facility] (mail-order)             , [place]  Ph: [phone]       Fax: [phone]   RxID:   [id number] METOPROLOL TARTRATE 50 MG TABS (METOPROLOL TARTRATE) 1 two times a day  #180 x 3   Entered and Authorized by:   Marga Melnick MD   Signed by:   Marga Melnick MD on 09/12/2010   Method used:   Faxed to ...       [facility] (mail-order)             , [place]         Ph: [phone]       Fax: [phone]   RxID:   [id number]    Orders Added: 1)  Medicare -1st Annual Wellness Visit [G0438] 2)  Est. Patient Level III [29518]     Appended Document: complete physical fasting labs scheduled for 09/17/2010/////sph

## 2010-09-20 NOTE — Progress Notes (Signed)
Summary: fasting labs  = 09/17/2010  ---- Converted from flag ---- ---- 09/14/2010 5:53 AM, Marga Melnick MD wrote: please schedule fasting labs (see Patient Instructions @ CPX) ; these are tests needed after my  review of chart ------------------------------       Additional Follow-up for Phone Call Additional follow up Details #2::    fasting labs scheduled for 09/17/2010.Marland KitchenMarland KitchenJerolyn Shin  September 14, 2010 10:34 AM

## 2010-09-25 NOTE — Progress Notes (Signed)
Summary: Med Concerns (Non-URGENT)  Phone Note From Pharmacy Call back at [phone]   Caller: [facility]  Summary of Call: Message left on voicemail: Please call to discuss Metoprolol, Ref # [id number]  I called [facility] and was informed that patient was previously taking Metoprolol Succ and rx from 09/12/2010 is for Metoprolol Tart. The pharmacy would like for the Dr to confirm this is an appropriate change.   Dr.Hopper please advise./Chrae Marlynn Perking CMA  September 19, 2010 2:12 PM   Follow-up for Phone Call        The change should have been Metoprolol 25 mg two times a day #180 with 3 refills. It was my understanding that the generic Toprol XR 50 was unavailable or very expensive. If that is not true ; continue prior XR 50 mg once daily #90, RX3. Follow-up by: Marga Melnick MD,  September 20, 2010 4:56 AM  Additional Follow-up for Phone Call Additional follow up Details #1::        spoke w/ [facility] pharmacist clarified prescriptions and patient will be pescribed what he was already taken previously.Marland KitchenMarland KitchenDoristine Devoid CMA  September 21, 2010 8:51 AM     New/Updated Medications: METOPROLOL SUCCINATE 50 MG XR24H-TAB (METOPROLOL SUCCINATE) take two times a day

## 2010-09-25 NOTE — Progress Notes (Signed)
Summary: samples of Crestor  Phone Note Call from Patient Call back at [facility] [phone]   Caller: Patient's wife Rodney Sawyer Summary of Call: patient was seen for physical on 2/29 and forgot to ask for samples of Crestor---please call wife Rodney Sawyer to pick them up--[phone] Initial call taken by: Jerolyn Shin,  September 20, 2010 11:33 AM  Follow-up for Phone Call        spoke w/ patient wife informed samples left up front for pick up.....Marland KitchenMarland KitchenDoristine Devoid CMA  September 20, 2010 3:45 PM

## 2011-01-31 ENCOUNTER — Telehealth: Payer: Self-pay

## 2011-01-31 MED ORDER — ROSUVASTATIN CALCIUM 20 MG PO TABS: ORAL_TABLET | ORAL | Status: DC

## 2011-01-31 NOTE — Telephone Encounter (Signed)
Patient's wife left a message on voicemail that she would like rx for crestor and samples if available. Patient's wife indicated she will pick-up rx and samples tomorrow   Placed at the front for pick-up, samples in cabinet and rx in rx folder

## 2011-02-26 ENCOUNTER — Other Ambulatory Visit: Payer: Self-pay | Admitting: Internal Medicine

## 2011-09-17 ENCOUNTER — Telehealth: Payer: Self-pay | Admitting: Internal Medicine

## 2011-09-17 MED ORDER — HYDROCHLOROTHIAZIDE 12.5 MG PO CAPS: ORAL_CAPSULE | ORAL | Status: DC

## 2011-09-17 NOTE — Telephone Encounter (Signed)
°  RX refill for  Hydrochlorothiazide capsule

## 2011-09-17 NOTE — Telephone Encounter (Signed)
RX sent, please contact patient to set up CPX

## 2011-11-21 ENCOUNTER — Ambulatory Visit (INDEPENDENT_AMBULATORY_CARE_PROVIDER_SITE_OTHER): Payer: Medicare Other | Admitting: Internal Medicine

## 2011-11-21 ENCOUNTER — Encounter: Payer: Self-pay | Admitting: Internal Medicine

## 2011-11-21 VITALS — BP 110/78 | HR 60 | Temp 98.3°F | Wt 235.0 lb

## 2011-11-21 DIAGNOSIS — D126 Benign neoplasm of colon, unspecified: Secondary | ICD-10-CM

## 2011-11-21 DIAGNOSIS — Z23 Encounter for immunization: Secondary | ICD-10-CM

## 2011-11-21 DIAGNOSIS — I10 Essential (primary) hypertension: Secondary | ICD-10-CM

## 2011-11-21 DIAGNOSIS — Z0000 Encounter for general adult medical examination without abnormal findings: Secondary | ICD-10-CM

## 2011-11-21 DIAGNOSIS — E785 Hyperlipidemia, unspecified: Secondary | ICD-10-CM

## 2011-11-21 DIAGNOSIS — R7309 Other abnormal glucose: Secondary | ICD-10-CM

## 2011-11-21 DIAGNOSIS — I4891 Unspecified atrial fibrillation: Secondary | ICD-10-CM

## 2011-11-21 LAB — CBC WITH DIFFERENTIAL/PLATELET
Basophils Absolute: 0.1 10*3/uL (ref 0.0–0.1)
Basophils Relative: 0.7 % (ref 0.0–3.0)
Eosinophils Absolute: 0.2 10*3/uL (ref 0.0–0.7)
Eosinophils Relative: 2.5 % (ref 0.0–5.0)
HCT: 44.3 % (ref 39.0–52.0)
Hemoglobin: 15.1 g/dL (ref 13.0–17.0)
Lymphocytes Relative: 16.4 % (ref 12.0–46.0)
Lymphs Abs: 1.3 10*3/uL (ref 0.7–4.0)
MCHC: 34.1 g/dL (ref 30.0–36.0)
MCV: 92.8 fl (ref 78.0–100.0)
Monocytes Absolute: 0.8 10*3/uL (ref 0.1–1.0)
Monocytes Relative: 9.4 % (ref 3.0–12.0)
Neutro Abs: 5.8 10*3/uL (ref 1.4–7.7)
Neutrophils Relative %: 71 % (ref 43.0–77.0)
Platelets: 241 10*3/uL (ref 150.0–400.0)
RBC: 4.77 Mil/uL (ref 4.22–5.81)
RDW: 12.8 % (ref 11.5–14.6)
WBC: 8.1 10*3/uL (ref 4.5–10.5)

## 2011-11-21 LAB — HEPATIC FUNCTION PANEL
ALT: 15 U/L (ref 0–53)
AST: 19 U/L (ref 0–37)
Albumin: 4 g/dL (ref 3.5–5.2)
Alkaline Phosphatase: 41 U/L (ref 39–117)
Bilirubin, Direct: 0 mg/dL (ref 0.0–0.3)
Total Bilirubin: 0.9 mg/dL (ref 0.3–1.2)
Total Protein: 7.1 g/dL (ref 6.0–8.3)

## 2011-11-21 LAB — BASIC METABOLIC PANEL
BUN: 19 mg/dL (ref 6–23)
CO2: 25 mEq/L (ref 19–32)
Calcium: 8.8 mg/dL (ref 8.4–10.5)
Chloride: 103 mEq/L (ref 96–112)
Creatinine, Ser: 0.9 mg/dL (ref 0.4–1.5)
GFR: 91.83 mL/min (ref >60.00)
Glucose, Bld: 89 mg/dL (ref 70–99)
Potassium: 3.5 mEq/L (ref 3.5–5.1)
Sodium: 140 mEq/L (ref 135–145)

## 2011-11-21 LAB — LIPID PANEL
Cholesterol: 136 mg/dL (ref 0–200)
HDL: 41.5 mg/dL (ref >39.00)
LDL Cholesterol: 62 mg/dL (ref 0–99)
Total CHOL/HDL Ratio: 3
Triglycerides: 162 mg/dL — ABNORMAL HIGH (ref 0.0–149.0)
VLDL: 32.4 mg/dL (ref 0.0–40.0)

## 2011-11-21 LAB — TSH: TSH: 0.9 u[IU]/mL (ref 0.35–5.50)

## 2011-11-21 NOTE — Progress Notes (Signed)
Subjective:    Patient ID: Rodney Sawyer, male    DOB: 1945/09/02, 66 y.o.   MRN: [id number]  HPI Medicare Wellness Visit:  The following psychosocial & medical history were reviewed as required by Medicare.   Social history: caffeine: minimal , alcohol:  7/ week ,  tobacco use : never & exercise : minimal.   Home & personal  safety / fall risk: no issues, activities of daily living: no limtations , seatbelt use : yes , and smoke alarm employment : yes .  Power of Attorney/Living Will status : NEEDED  Vision ( as recorded per Nurse) & Hearing  evaluation :  Last Ophth exam 2011. See exam. Orientation :oriented X 3 , memory & recall :good,  math testing: good,and mood & affect : normal . Depression / anxiety: denied Travel history : 2007 [place] , immunization status :PNA needed , transfusion history:  no, and preventive health surveillance ( colonoscopies, BMD , etc as per protocol/ [facility]): colonoscopy due, Dental care:  annually . Chart reviewed &  Updated. Active issues reviewed & addressed.       Review of Systems HYPERTENSION: Disease Monitoring: Blood pressure range-115/75 -120/80  Chest pain, palpitations-no       Dyspnea- no Medications: Compliance- yes  Lightheadedness,Syncope- no    Edema- no  FASTING HYPERGLYCEMIA , PMH of: Polyuria/phagia/dipsia-no       Visual problems- no  HYPERLIPIDEMIA: Disease Monitoring: See symptoms for Hypertension Medications: Compliance- yes  Abd pain, bowel changes- no, occasional constipation   Muscle aches- no       Objective:   Physical Exam Gen.: Healthy and well-nourished in appearance. Alert, appropriate and cooperative throughout exam. Head: Normocephalic without obvious abnormalities Eyes: No corneal or conjunctival inflammation noted. Pupils equal round reactive to light and accommodation. Fundal exam is benign without hemorrhages, exudate, papilledema. Extraocular motion intact. Vision grossly slightly decreased Ears:  External  ear exam reveals no significant lesions or deformities. Canals clear .TMs normal. Hearing is grossly normal bilaterally. Nose: External nasal exam reveals no deformity or inflammation. Nasal mucosa are pink and moist. No lesions or exudates noted.   Mouth: Oral mucosa and oropharynx reveal no lesions or exudates. Teeth in good repair. Neck: No deformities, masses, or tenderness noted. Range of motion & Thyroid normal Lungs: Normal respiratory effort; chest expands symmetrically. Lungs are clear to auscultation without rales, wheezes, or increased work of breathing. Heart: Slow rate and regular rhythm. Normal S1 and S2. No gallop, click, or rub. S4 w/o  murmur. Abdomen: Bowel sounds normal; abdomen soft and nontender. No masses, organomegaly or hernias noted. Genitalia/ DRE:  Musculoskeletal/extremities: No deformity or scoliosis noted of  the thoracic or lumbar spine. No clubbing, cyanosis, edema, or deformity noted. Range of motion  normal .Tone & strength  normal.Joints normal. Nail health  good. Vascular: Carotid, radial artery, dorsalis pedis and  posterior tibial pulses are full and equal. No bruits present. Neurologic: Alert and oriented x3. Deep tendon reflexes symmetrical and normal.          Exams normal except for varices on the left. No prostatic enlargement or nodules present  Skin: Intact without suspicious lesions or rashes. Lymph: No cervical, axillary, or inguinal lymphadenopathy present. Psych: Mood and affect are normal. Normally interactive  Assessment & Plan:  #1 Medicare Wellness Exam; criteria met ; data entered #2 Problem List reviewed ; Assessment/ Recommendations made Plan: see Orders

## 2011-11-21 NOTE — Patient Instructions (Signed)
Preventive Health Care: Exercise at least 30-45 minutes a day,  3-4 days a week.  Eat a low-fat diet with lots of fruits and vegetables, up to 7-9 servings per day.  Consume less than 40 grams of sugar per day from foods & drinks with High Fructose Corn Sugar as #1,2,3 or # 4 on label. Eye Doctor - have an eye exam @ least annually.                                                      Health Care Power of Attorney & Living Will. Complete if not in place ; these place you in charge of your health care decisions. Please try to go on My Chart within the next 24 hours to allow me to release the results directly to you.  

## 2011-11-21 NOTE — Progress Notes (Signed)
Addended by: Candie Echevaria L on: 11/21/2011 12:11 PM   Modules accepted: Orders

## 2011-12-05 ENCOUNTER — Telehealth: Payer: Self-pay | Admitting: Internal Medicine

## 2011-12-05 NOTE — Telephone Encounter (Signed)
Pts wife Britta Mccreedy) called stating she needs to pick up samples of Crestor for the pt. She states he takes 20mg , but will accept 40mg  and split them to last longer.

## 2011-12-05 NOTE — Telephone Encounter (Signed)
Left message informing patient samples are ready for pick-up

## 2011-12-31 ENCOUNTER — Telehealth: Payer: Self-pay | Admitting: Internal Medicine

## 2011-12-31 MED ORDER — METOPROLOL TARTRATE 50 MG PO TABS: 50.0000 mg | ORAL_TABLET | Freq: Two times a day (BID) | ORAL | Status: DC

## 2011-12-31 MED ORDER — BENAZEPRIL HCL 20 MG PO TABS: 20.0000 mg | ORAL_TABLET | Freq: Two times a day (BID) | ORAL | Status: DC

## 2011-12-31 MED ORDER — DILTIAZEM HCL ER 180 MG PO CP24: 180.0000 mg | ORAL_CAPSULE | Freq: Two times a day (BID) | ORAL | Status: DC

## 2011-12-31 MED ORDER — HYDROCHLOROTHIAZIDE 12.5 MG PO CAPS: ORAL_CAPSULE | ORAL | Status: DC

## 2011-12-31 NOTE — Telephone Encounter (Signed)
Refill: Benazepril tab 20mg . Take 1 tablet twice a day.  90 day supply Hydrochlorot cap 12.5mg . Take 1 by mouth daily (appointment due).  90 day supply Metoprolol suc er tab 50mg . Take 1 tablet twice a day.  90 day supply Diltiazem er cap 180mg /24hr. Take 1 capsule twice a day.  90 day supply

## 2011-12-31 NOTE — Telephone Encounter (Signed)
RXs sent.

## 2012-05-06 ENCOUNTER — Ambulatory Visit (INDEPENDENT_AMBULATORY_CARE_PROVIDER_SITE_OTHER): Payer: Medicare Other | Admitting: *Deleted

## 2012-05-06 DIAGNOSIS — Z23 Encounter for immunization: Secondary | ICD-10-CM

## 2012-06-16 ENCOUNTER — Telehealth: Payer: Self-pay | Admitting: Internal Medicine

## 2012-06-16 NOTE — Telephone Encounter (Signed)
Patient spouse called requesting samples of Crestor 20 mg (can take 40's and half them). Call [phone] when ready.

## 2012-06-17 NOTE — Telephone Encounter (Signed)
Left message on voicemail informing patient's wife samples ready for pick-up

## 2012-10-27 ENCOUNTER — Telehealth: Payer: Self-pay | Admitting: Internal Medicine

## 2012-10-27 NOTE — Telephone Encounter (Signed)
Samples at front desk 

## 2012-10-27 NOTE — Telephone Encounter (Signed)
Patient's wife called requesting samples of Crestor 20 mg. She will be here this afternoon around 3 and would like to pick this up at that time.

## 2012-10-27 NOTE — Telephone Encounter (Signed)
4 weeks

## 2012-11-27 ENCOUNTER — Telehealth: Payer: Self-pay | Admitting: Internal Medicine

## 2012-11-27 DIAGNOSIS — T887XXA Unspecified adverse effect of drug or medicament, initial encounter: Secondary | ICD-10-CM

## 2012-11-27 DIAGNOSIS — E785 Hyperlipidemia, unspecified: Secondary | ICD-10-CM

## 2012-11-27 NOTE — Telephone Encounter (Signed)
Patient requesting samples of Crestor 20mg . Call [phone] when ready for pick up.

## 2012-11-27 NOTE — Telephone Encounter (Signed)
Left message with pt wife to schedule lab appt and OV. Please advise if ok for samples until pt can be seen?

## 2012-11-27 NOTE — Telephone Encounter (Signed)
#  35 pills AND fasting lipids , hepatic panel & CK . Codes:272.4, 995.20

## 2012-11-27 NOTE — Telephone Encounter (Signed)
Samples placed up front for pt and labs ordered. Pt notified samples are ready. Pt has a CPE for 6.3.14.

## 2012-11-27 NOTE — Telephone Encounter (Signed)
Crestor 20mg  samples. Pt last seen on 11-21-11, med last filled on 01-31-11  #30 with 3 refill. Last Lipid was completed on 11-21-11. Dr. Laury Axon had advise repeat labs in 6mos, pt never scheduled.

## 2012-12-15 ENCOUNTER — Encounter: Payer: Self-pay | Admitting: Internal Medicine

## 2012-12-15 ENCOUNTER — Ambulatory Visit (INDEPENDENT_AMBULATORY_CARE_PROVIDER_SITE_OTHER): Payer: Medicare Other | Admitting: Internal Medicine

## 2012-12-15 VITALS — BP 118/76 | HR 60 | Temp 98.1°F | Resp 14 | Ht 71.0 in | Wt 240.0 lb

## 2012-12-15 DIAGNOSIS — Z86010 Personal history of colonic polyps: Secondary | ICD-10-CM

## 2012-12-15 DIAGNOSIS — E785 Hyperlipidemia, unspecified: Secondary | ICD-10-CM

## 2012-12-15 DIAGNOSIS — R9431 Abnormal electrocardiogram [ECG] [EKG]: Secondary | ICD-10-CM

## 2012-12-15 DIAGNOSIS — I10 Essential (primary) hypertension: Secondary | ICD-10-CM

## 2012-12-15 DIAGNOSIS — Z0000 Encounter for general adult medical examination without abnormal findings: Secondary | ICD-10-CM

## 2012-12-15 LAB — CBC WITH DIFFERENTIAL/PLATELET
Basophils Absolute: 0 10*3/uL (ref 0.0–0.1)
Basophils Relative: 0.2 % (ref 0.0–3.0)
Eosinophils Absolute: 0.3 10*3/uL (ref 0.0–0.7)
Eosinophils Relative: 3.3 % (ref 0.0–5.0)
HCT: 47.3 % (ref 39.0–52.0)
Hemoglobin: 16.3 g/dL (ref 13.0–17.0)
Lymphocytes Relative: 26.6 % (ref 12.0–46.0)
Lymphs Abs: 2.3 10*3/uL (ref 0.7–4.0)
MCHC: 34.4 g/dL (ref 30.0–36.0)
MCV: 93 fl (ref 78.0–100.0)
Monocytes Absolute: 0.8 10*3/uL (ref 0.1–1.0)
Monocytes Relative: 9.1 % (ref 3.0–12.0)
Neutro Abs: 5.3 10*3/uL (ref 1.4–7.7)
Neutrophils Relative %: 60.8 % (ref 43.0–77.0)
Platelets: 275 10*3/uL (ref 150.0–400.0)
RBC: 5.09 Mil/uL (ref 4.22–5.81)
RDW: 12.6 % (ref 11.5–14.6)
WBC: 8.7 10*3/uL (ref 4.5–10.5)

## 2012-12-15 LAB — BASIC METABOLIC PANEL
BUN: 22 mg/dL (ref 6–23)
CO2: 25 mEq/L (ref 19–32)
Calcium: 9 mg/dL (ref 8.4–10.5)
Chloride: 105 mEq/L (ref 96–112)
Creatinine, Ser: 1.1 mg/dL (ref 0.4–1.5)
GFR: 70.75 mL/min (ref >60.00)
Glucose, Bld: 107 mg/dL — ABNORMAL HIGH (ref 70–99)
Potassium: 4.2 mEq/L (ref 3.5–5.1)
Sodium: 139 mEq/L (ref 135–145)

## 2012-12-15 LAB — HEPATIC FUNCTION PANEL
ALT: 20 U/L (ref 0–53)
AST: 20 U/L (ref 0–37)
Albumin: 4.1 g/dL (ref 3.5–5.2)
Alkaline Phosphatase: 41 U/L (ref 39–117)
Bilirubin, Direct: 0.1 mg/dL (ref 0.0–0.3)
Total Bilirubin: 0.9 mg/dL (ref 0.3–1.2)
Total Protein: 7.6 g/dL (ref 6.0–8.3)

## 2012-12-15 LAB — LIPID PANEL
Cholesterol: 190 mg/dL (ref 0–200)
HDL: 40.9 mg/dL (ref >39.00)
LDL Cholesterol: 121 mg/dL — ABNORMAL HIGH (ref 0–99)
Total CHOL/HDL Ratio: 5
Triglycerides: 143 mg/dL (ref 0.0–149.0)
VLDL: 28.6 mg/dL (ref 0.0–40.0)

## 2012-12-15 LAB — TSH: TSH: 1.01 u[IU]/mL (ref 0.35–5.50)

## 2012-12-15 MED ORDER — BENAZEPRIL HCL 20 MG PO TABS: 20.0000 mg | ORAL_TABLET | Freq: Two times a day (BID) | ORAL | Status: DC

## 2012-12-15 MED ORDER — DILTIAZEM HCL ER 180 MG PO CP24: 180.0000 mg | ORAL_CAPSULE | Freq: Two times a day (BID) | ORAL | Status: DC

## 2012-12-15 MED ORDER — METOPROLOL TARTRATE 50 MG PO TABS: 50.0000 mg | ORAL_TABLET | Freq: Two times a day (BID) | ORAL | Status: DC

## 2012-12-15 NOTE — Progress Notes (Signed)
Subjective:    Patient ID: JACHIN COURY, male    DOB: 01-16-1946, 67 y.o.   MRN: [id number]  HPI Medicare Wellness Visit:  Psychosocial & medical history were reviewed as required by Medicare (abuse,antisocial behavioral risks,firearm risk).  Social history: caffeine: 2 diet colas/ day  , alcohol: 5-7 wine/ week  ,  tobacco use: never Exercise : see below  Home & personal  safety / fall risk:no Limitations of activities of daily living:no Seatbelt  and smoke alarm use:yes Power of Attorney/Living Will status : needed Ophthalmology exam status :no Hearing evaluation status:no Orientation :oriented X 3 Memory & recall :good Math testing:good Mood & affect :  normal. Active depression / anxiety:no Foreign travel history : [place] Immunization status for Shingles /Flu/ PNA/ tetanus :Shingles due Transfusion history:  no Preventive health surveillance status of colonoscopy as per protocol/ [id number] Dental care:  Every 12mos Chart reviewed &  Updated. Active issues reviewed & addressed.      Review of Systems He is on a heart healthy diet; he exercises minimally . He denies chest pain, palpitations, dyspnea, or claudication. Family history is negative for premature coronary disease. BP 110/70 on average. No AF recurrence.     Objective:   Physical Exam Gen.:  well-nourished in appearance. Alert, appropriate and cooperative throughout exam. Head: Normocephalic without obvious abnormalities;  no alopecia  Eyes: No corneal or conjunctival inflammation noted.  Extraocular motion intact. Vision grossly decreased OD without  lenses Ears: External  ear exam reveals no significant lesions or deformities. Canals clear .TMs normal. Hearing is grossly normal bilaterally. Nose: External nasal exam reveals no deformity or inflammation. Nasal mucosa are pink and moist. No lesions or exudates noted.   Mouth: Oral mucosa and oropharynx reveal no lesions or exudates. Teeth in good  repair. Neck: No deformities, masses, or tenderness noted. Range of motion & Thyroid normal. Lungs: Normal respiratory effort; chest expands symmetrically. Lungs are clear to auscultation without rales, wheezes, or increased work of breathing. Heart: Slow rate and regular  rhythm. Normal S1 and S2. No gallop, click, or rub. No murmur. Abdomen: Bowel sounds normal; abdomen soft and nontender. No masses, organomegaly or hernias noted. Genitalia: Genitalia normal except for right vasectomy granuloma & left varices. Prostate is normal without enlargement, asymmetry, nodularity, or induration.                                  Musculoskeletal/extremities: Slight accentuated curvature of upper thoracic  Spine. There is some asymmetry of the posterior thoracic musculature suggesting occult scoliosis. No clubbing, cyanosis, edema, or significant extremity  deformity noted. Range of motion normal .Tone & strength  Normal. Joints normal. Nail health good. Able to lie down & sit up w/o help. Negative SLR bilaterally Vascular: Carotid, radial artery, dorsalis pedis and  posterior tibial pulses are full and equal. No bruits present. Neurologic: Alert and oriented x3. Deep tendon reflexes symmetrical and normal.   Skin: Intact without suspicious lesions or rashes. Lymph: No cervical, axillary, or inguinal lymphadenopathy present. Psych: Mood and affect are normal. Normally interactive  Assessment & Plan:  #1 Medicare Wellness Exam; criteria met ; data entered #2 Problem List/Diagnoses reviewed Plan:  Assessments made/ Orders entered  

## 2012-12-15 NOTE — Patient Instructions (Addendum)
Minimal Blood Pressure Goal= AVERAGE < 140/90;  Ideal is an AVERAGE < 135/85. This AVERAGE should be calculated from @ least 5-7 BP readings taken @ different times of day on different days of week. You should not respond to isolated BP readings , but rather the AVERAGE for that week .Please bring your  blood pressure cuff to office visits to verify that it is reliable.It  can also be checked against the blood pressure device at the pharmacy. Finger or wrist cuffs are not dependable; an arm cuff is.  Take the EKG to any emergency room or preop visits. There are nonspecific changes; as long as there is no new change these are not clinically significant . If the old EKG is not available for comparison; it may result in unnecessary hospitalization for observation with significant unnecessary expense.

## 2012-12-18 ENCOUNTER — Ambulatory Visit: Payer: Medicare Other

## 2012-12-18 DIAGNOSIS — R7309 Other abnormal glucose: Secondary | ICD-10-CM

## 2012-12-18 LAB — HEMOGLOBIN A1C: Hgb A1c MFr Bld: 5.7 % (ref 4.6–6.5)

## 2012-12-31 ENCOUNTER — Telehealth: Payer: Self-pay | Admitting: Internal Medicine

## 2012-12-31 MED ORDER — ROSUVASTATIN CALCIUM 20 MG PO TABS: ORAL_TABLET | ORAL | Status: DC

## 2012-12-31 NOTE — Telephone Encounter (Signed)
Pt would like to know if he could have some rosuvastatin (CRESTOR) 20 MG tablet samples? thanks

## 2012-12-31 NOTE — Telephone Encounter (Signed)
Left Pt detail Vm sample ready for pick up.

## 2012-12-31 NOTE — Telephone Encounter (Signed)
Ok if available

## 2013-02-15 ENCOUNTER — Telehealth: Payer: Self-pay | Admitting: Internal Medicine

## 2013-02-15 NOTE — Telephone Encounter (Signed)
Spoke with wife. Gave 10mg  tablets for one month.

## 2013-02-15 NOTE — Telephone Encounter (Signed)
Patient's wife called stating the patient needs samples of Crestor 40 mg. Call Britta Mccreedy (spouse) @ [phone]

## 2013-03-11 ENCOUNTER — Emergency Department (HOSPITAL_BASED_OUTPATIENT_CLINIC_OR_DEPARTMENT_OTHER): Payer: Worker's Compensation

## 2013-03-11 ENCOUNTER — Emergency Department (HOSPITAL_BASED_OUTPATIENT_CLINIC_OR_DEPARTMENT_OTHER)
Admission: EM | Admit: 2013-03-11 | Discharge: 2013-03-11 | Disposition: A | Discharge status: Home / Self Care | Payer: Worker's Compensation | Attending: Emergency Medicine | Admitting: Emergency Medicine

## 2013-03-11 ENCOUNTER — Encounter (HOSPITAL_BASED_OUTPATIENT_CLINIC_OR_DEPARTMENT_OTHER): Payer: Self-pay | Admitting: *Deleted

## 2013-03-11 DIAGNOSIS — Y92410 Unspecified street and highway as the place of occurrence of the external cause: Secondary | ICD-10-CM | POA: Insufficient documentation

## 2013-03-11 DIAGNOSIS — E785 Hyperlipidemia, unspecified: Secondary | ICD-10-CM | POA: Insufficient documentation

## 2013-03-11 DIAGNOSIS — S46909A Unspecified injury of unspecified muscle, fascia and tendon at shoulder and upper arm level, unspecified arm, initial encounter: Secondary | ICD-10-CM | POA: Insufficient documentation

## 2013-03-11 DIAGNOSIS — I4891 Unspecified atrial fibrillation: Secondary | ICD-10-CM | POA: Insufficient documentation

## 2013-03-11 DIAGNOSIS — Z7982 Long term (current) use of aspirin: Secondary | ICD-10-CM | POA: Insufficient documentation

## 2013-03-11 DIAGNOSIS — S4980XA Other specified injuries of shoulder and upper arm, unspecified arm, initial encounter: Secondary | ICD-10-CM | POA: Insufficient documentation

## 2013-03-11 DIAGNOSIS — M25512 Pain in left shoulder: Secondary | ICD-10-CM

## 2013-03-11 DIAGNOSIS — I10 Essential (primary) hypertension: Secondary | ICD-10-CM | POA: Insufficient documentation

## 2013-03-11 DIAGNOSIS — Z79899 Other long term (current) drug therapy: Secondary | ICD-10-CM | POA: Insufficient documentation

## 2013-03-11 DIAGNOSIS — Y939 Activity, unspecified: Secondary | ICD-10-CM | POA: Insufficient documentation

## 2013-03-11 NOTE — ED Notes (Signed)
Patient transported to X-ray 

## 2013-03-11 NOTE — ED Notes (Signed)
Pt stated he was unable to walk well, therefore I place Pt in a wheel chair and Pt waiting in the front lobby for ride to arrive to pick him up and I advice Pt if anything is needed to inform the front registration.

## 2013-03-11 NOTE — ED Notes (Signed)
Discharge instructions reviewed, upon walking to check out window, pt was stumbling, unsteady gait, pt stated this was normal and that he was fine. Accompanied patient to check out window, emt assisted pt into a wheel chair, pt alert and oriented, neuro intact, pt in waiting room awaiting transportation home
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
## 2013-03-12 ENCOUNTER — Inpatient Hospital Stay (HOSPITAL_COMMUNITY)
Admission: EM | Admit: 2013-03-12 | Discharge: 2013-03-15 | DRG: 472 | Disposition: A | Discharge status: Home / Self Care | Payer: Worker's Compensation | Attending: Neurosurgery | Admitting: Neurosurgery

## 2013-03-12 ENCOUNTER — Encounter: Payer: Self-pay | Admitting: Internal Medicine

## 2013-03-12 ENCOUNTER — Encounter (HOSPITAL_COMMUNITY): Payer: Self-pay | Admitting: *Deleted

## 2013-03-12 ENCOUNTER — Emergency Department (HOSPITAL_COMMUNITY): Payer: Worker's Compensation

## 2013-03-12 ENCOUNTER — Ambulatory Visit (INDEPENDENT_AMBULATORY_CARE_PROVIDER_SITE_OTHER): Payer: Worker's Compensation | Admitting: Internal Medicine

## 2013-03-12 ENCOUNTER — Encounter (HOSPITAL_COMMUNITY): Payer: Self-pay | Admitting: Cardiology

## 2013-03-12 VITALS — BP 126/68 | HR 64 | Temp 98.6°F

## 2013-03-12 DIAGNOSIS — M5000 Cervical disc disorder with myelopathy, unspecified cervical region: Principal | ICD-10-CM | POA: Diagnosis present

## 2013-03-12 DIAGNOSIS — M542 Cervicalgia: Secondary | ICD-10-CM | POA: Insufficient documentation

## 2013-03-12 DIAGNOSIS — G959 Disease of spinal cord, unspecified: Secondary | ICD-10-CM

## 2013-03-12 DIAGNOSIS — Z85820 Personal history of malignant melanoma of skin: Secondary | ICD-10-CM

## 2013-03-12 DIAGNOSIS — I10 Essential (primary) hypertension: Secondary | ICD-10-CM | POA: Diagnosis present

## 2013-03-12 DIAGNOSIS — Z79899 Other long term (current) drug therapy: Secondary | ICD-10-CM

## 2013-03-12 DIAGNOSIS — E785 Hyperlipidemia, unspecified: Secondary | ICD-10-CM | POA: Diagnosis present

## 2013-03-12 DIAGNOSIS — Z7982 Long term (current) use of aspirin: Secondary | ICD-10-CM

## 2013-03-12 DIAGNOSIS — M4712 Other spondylosis with myelopathy, cervical region: Secondary | ICD-10-CM | POA: Diagnosis present

## 2013-03-12 DIAGNOSIS — I4891 Unspecified atrial fibrillation: Secondary | ICD-10-CM | POA: Diagnosis present

## 2013-03-12 LAB — CBC WITH DIFFERENTIAL/PLATELET
Basophils Absolute: 0 10*3/uL (ref 0.0–0.1)
Basophils Relative: 0 % (ref 0–1)
Eosinophils Absolute: 0 10*3/uL (ref 0.0–0.7)
Eosinophils Relative: 0 % (ref 0–5)
HCT: 44.4 % (ref 39.0–52.0)
Hemoglobin: 16.2 g/dL (ref 13.0–17.0)
Lymphocytes Relative: 16 % (ref 12–46)
Lymphs Abs: 1.5 10*3/uL (ref 0.7–4.0)
MCH: 32.9 pg (ref 26.0–34.0)
MCHC: 36.5 g/dL — ABNORMAL HIGH (ref 30.0–36.0)
MCV: 90.2 fL (ref 78.0–100.0)
Monocytes Absolute: 0.8 10*3/uL (ref 0.1–1.0)
Monocytes Relative: 9 % (ref 3–12)
Neutro Abs: 6.7 10*3/uL (ref 1.7–7.7)
Neutrophils Relative %: 74 % (ref 43–77)
Platelets: 255 10*3/uL (ref 150–400)
RBC: 4.92 MIL/uL (ref 4.22–5.81)
RDW: 13.2 % (ref 11.5–15.5)
WBC: 9 10*3/uL (ref 4.0–10.5)

## 2013-03-12 LAB — COMPREHENSIVE METABOLIC PANEL
ALT: 17 U/L (ref 0–53)
AST: 18 U/L (ref 0–37)
Albumin: 4.4 g/dL (ref 3.5–5.2)
Alkaline Phosphatase: 49 U/L (ref 39–117)
BUN: 20 mg/dL (ref 6–23)
CO2: 22 mEq/L (ref 19–32)
Calcium: 9.6 mg/dL (ref 8.4–10.5)
Chloride: 100 mEq/L (ref 96–112)
Creatinine, Ser: 1.21 mg/dL (ref 0.50–1.35)
GFR calc Af Amer: 69 mL/min — ABNORMAL LOW (ref >90)
GFR calc non Af Amer: 60 mL/min — ABNORMAL LOW (ref >90)
Glucose, Bld: 124 mg/dL — ABNORMAL HIGH (ref 70–99)
Potassium: 4 mEq/L (ref 3.5–5.1)
Sodium: 137 mEq/L (ref 135–145)
Total Bilirubin: 0.7 mg/dL (ref 0.3–1.2)
Total Protein: 7.9 g/dL (ref 6.0–8.3)

## 2013-03-12 LAB — TROPONIN I: Troponin I: <0.3 ng/mL (ref <0.30)

## 2013-03-12 LAB — PROTIME-INR
INR: 0.98 (ref 0.00–1.49)
Prothrombin Time: 12.8 seconds (ref 11.6–15.2)

## 2013-03-12 MED ORDER — ACETAMINOPHEN 325 MG PO TABS: 650.0000 mg | ORAL_TABLET | Freq: Four times a day (QID) | ORAL | Status: DC | PRN

## 2013-03-12 MED ORDER — ATORVASTATIN CALCIUM 40 MG PO TABS
40.0000 mg | ORAL_TABLET | Freq: Every day | ORAL | Status: DC
  Administered 2013-03-13 – 2013-03-14 (×2): 40 mg via ORAL
  Filled 2013-03-12 (×3): qty 1

## 2013-03-12 MED ORDER — METOPROLOL TARTRATE 50 MG PO TABS
50.0000 mg | ORAL_TABLET | Freq: Two times a day (BID) | ORAL | Status: DC
  Administered 2013-03-12 – 2013-03-15 (×5): 50 mg via ORAL
  Filled 2013-03-12 (×7): qty 1

## 2013-03-12 MED ORDER — SODIUM CHLORIDE 0.9 % IV SOLN: 250.0000 mL | INTRAVENOUS | Status: DC | PRN

## 2013-03-12 MED ORDER — DOCUSATE SODIUM 100 MG PO CAPS
100.0000 mg | ORAL_CAPSULE | Freq: Two times a day (BID) | ORAL | Status: DC
  Administered 2013-03-12 – 2013-03-15 (×5): 100 mg via ORAL
  Filled 2013-03-12 (×5): qty 1

## 2013-03-12 MED ORDER — DEXAMETHASONE SODIUM PHOSPHATE 10 MG/ML IJ SOLN
10.0000 mg | Freq: Once | INTRAMUSCULAR | Status: AC
  Administered 2013-03-12: 10 mg via INTRAVENOUS
  Filled 2013-03-12: qty 1

## 2013-03-12 MED ORDER — DEXAMETHASONE SODIUM PHOSPHATE 4 MG/ML IJ SOLN
4.0000 mg | Freq: Four times a day (QID) | INTRAMUSCULAR | Status: DC
  Administered 2013-03-12 – 2013-03-14 (×6): 4 mg via INTRAVENOUS
  Filled 2013-03-12 (×13): qty 1

## 2013-03-12 MED ORDER — HYDROCODONE-ACETAMINOPHEN 5-325 MG PO TABS
1.0000 | ORAL_TABLET | ORAL | Status: DC | PRN
  Administered 2013-03-14: 1 via ORAL
  Filled 2013-03-12: qty 1

## 2013-03-12 MED ORDER — FAMOTIDINE 20 MG PO TABS
20.0000 mg | ORAL_TABLET | Freq: Two times a day (BID) | ORAL | Status: DC
  Administered 2013-03-12 – 2013-03-15 (×5): 20 mg via ORAL
  Filled 2013-03-12 (×7): qty 1

## 2013-03-12 MED ORDER — SODIUM CHLORIDE 0.9 % IJ SOLN
3.0000 mL | Freq: Two times a day (BID) | INTRAMUSCULAR | Status: DC
  Administered 2013-03-12 – 2013-03-14 (×2): 3 mL via INTRAVENOUS

## 2013-03-12 MED ORDER — DILTIAZEM HCL ER 180 MG PO CP24
180.0000 mg | ORAL_CAPSULE | Freq: Two times a day (BID) | ORAL | Status: DC
  Administered 2013-03-12 – 2013-03-15 (×5): 180 mg via ORAL
  Filled 2013-03-12 (×7): qty 1

## 2013-03-12 MED ORDER — BISACODYL 10 MG RE SUPP: 10.0000 mg | Freq: Every day | RECTAL | Status: DC | PRN

## 2013-03-12 MED ORDER — BENAZEPRIL HCL 40 MG PO TABS
40.0000 mg | ORAL_TABLET | Freq: Two times a day (BID) | ORAL | Status: DC
Start: 2013-03-12 — End: 2013-03-15
  Administered 2013-03-12 – 2013-03-15 (×5): 40 mg via ORAL
  Filled 2013-03-12 (×7): qty 1

## 2013-03-12 MED ORDER — MAGNESIUM HYDROXIDE 400 MG/5ML PO SUSP: 30.0000 mL | Freq: Every day | ORAL | Status: DC | PRN

## 2013-03-12 MED ORDER — ACETAMINOPHEN 650 MG RE SUPP: 650.0000 mg | Freq: Four times a day (QID) | RECTAL | Status: DC | PRN

## 2013-03-12 MED ORDER — SODIUM CHLORIDE 0.9 % IJ SOLN: 3.0000 mL | INTRAMUSCULAR | Status: DC | PRN

## 2013-03-12 MED ORDER — VITAMIN E 180 MG (400 UNIT) PO CAPS
400.0000 [IU] | ORAL_CAPSULE | Freq: Two times a day (BID) | ORAL | Status: DC
  Administered 2013-03-12 – 2013-03-15 (×5): 400 [IU] via ORAL
  Filled 2013-03-12 (×7): qty 1
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
## 2013-03-12 NOTE — Patient Instructions (Addendum)
Please go to the emergency department the [facility] for further evaluation of the pain at  the neck and shoulder.
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
## 2013-03-12 NOTE — ED Notes (Signed)
Attempted to call report x 1  

## 2013-03-12 NOTE — ED Notes (Signed)
Wife cell's [phone]
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
## 2013-03-12 NOTE — ED Notes (Signed)
Admitting MD at bedside.

## 2013-03-13 ENCOUNTER — Inpatient Hospital Stay (HOSPITAL_COMMUNITY): Payer: Worker's Compensation

## 2013-03-13 ENCOUNTER — Encounter (HOSPITAL_COMMUNITY): Payer: Self-pay | Admitting: Anesthesiology

## 2013-03-13 ENCOUNTER — Inpatient Hospital Stay (HOSPITAL_COMMUNITY): Payer: Worker's Compensation | Admitting: Anesthesiology

## 2013-03-13 ENCOUNTER — Encounter (HOSPITAL_COMMUNITY): Payer: Self-pay | Admitting: Certified Registered"

## 2013-03-13 ENCOUNTER — Encounter (HOSPITAL_COMMUNITY)
Admission: EM | Disposition: A | Discharge status: Home / Self Care | Payer: Self-pay | Source: Home / Self Care | Attending: Neurosurgery

## 2013-03-13 HISTORY — PX: ANTERIOR CERVICAL DECOMP/DISCECTOMY FUSION: SHX1161

## 2013-03-13 SURGERY — ANTERIOR CERVICAL DECOMPRESSION/DISCECTOMY FUSION 2 LEVELS
Anesthesia: General | Site: Neck | Wound class: Clean

## 2013-03-13 MED ORDER — MIDAZOLAM HCL 5 MG/5ML IJ SOLN
INTRAMUSCULAR | Status: DC | PRN
  Administered 2013-03-13: 2 mg via INTRAVENOUS

## 2013-03-13 MED ORDER — 0.9 % SODIUM CHLORIDE (POUR BTL) OPTIME
TOPICAL | Status: DC | PRN
  Administered 2013-03-13: 1000 mL

## 2013-03-13 MED ORDER — THROMBIN 20000 UNITS EX KIT
PACK | CUTANEOUS | Status: DC | PRN
  Administered 2013-03-13: 12:00:00 via TOPICAL

## 2013-03-13 MED ORDER — PROMETHAZINE HCL 25 MG PO TABS: 12.5000 mg | ORAL_TABLET | ORAL | Status: DC | PRN

## 2013-03-13 MED ORDER — OXYCODONE HCL 5 MG PO TABS: 5.0000 mg | ORAL_TABLET | Freq: Once | ORAL | Status: AC | PRN

## 2013-03-13 MED ORDER — HYDROMORPHONE HCL PF 1 MG/ML IJ SOLN
0.2500 mg | INTRAMUSCULAR | Status: DC | PRN
  Administered 2013-03-13: 0.5 mg via INTRAVENOUS

## 2013-03-13 MED ORDER — DEXAMETHASONE SODIUM PHOSPHATE 4 MG/ML IJ SOLN
INTRAMUSCULAR | Status: DC | PRN
  Administered 2013-03-13: 10 mg via INTRAVENOUS

## 2013-03-13 MED ORDER — PROMETHAZINE HCL 25 MG/ML IJ SOLN
12.5000 mg | INTRAMUSCULAR | Status: DC | PRN
  Filled 2013-03-13: qty 1

## 2013-03-13 MED ORDER — LIDOCAINE-EPINEPHRINE 1 %-1:100000 IJ SOLN
INTRAMUSCULAR | Status: DC | PRN
  Administered 2013-03-13: 10 mL via INTRADERMAL

## 2013-03-13 MED ORDER — SODIUM CHLORIDE 0.9 % IV SOLN: 250.0000 mL | INTRAVENOUS | Status: DC | PRN

## 2013-03-13 MED ORDER — PROPOFOL 10 MG/ML IV BOLUS
INTRAVENOUS | Status: DC | PRN
  Administered 2013-03-13: 200 mg via INTRAVENOUS

## 2013-03-13 MED ORDER — CYCLOBENZAPRINE HCL 10 MG PO TABS: 10.0000 mg | ORAL_TABLET | Freq: Three times a day (TID) | ORAL | Status: DC | PRN

## 2013-03-13 MED ORDER — PHENYLEPHRINE HCL 10 MG/ML IJ SOLN
INTRAMUSCULAR | Status: DC | PRN
  Administered 2013-03-13 (×3): 80 ug via INTRAVENOUS
  Administered 2013-03-13: 160 ug via INTRAVENOUS

## 2013-03-13 MED ORDER — LACTATED RINGERS IV SOLN
INTRAVENOUS | Status: DC | PRN
  Administered 2013-03-13 (×2): via INTRAVENOUS

## 2013-03-13 MED ORDER — ONDANSETRON HCL 4 MG/2ML IJ SOLN: 4.0000 mg | Freq: Four times a day (QID) | INTRAMUSCULAR | Status: DC | PRN

## 2013-03-13 MED ORDER — LIDOCAINE HCL (CARDIAC) 20 MG/ML IV SOLN
INTRAVENOUS | Status: DC | PRN
  Administered 2013-03-13: 80 mg via INTRAVENOUS

## 2013-03-13 MED ORDER — OXYCODONE-ACETAMINOPHEN 5-325 MG PO TABS
1.0000 | ORAL_TABLET | ORAL | Status: DC | PRN
  Administered 2013-03-13: 2 via ORAL
  Filled 2013-03-13: qty 2

## 2013-03-13 MED ORDER — THROMBIN 5000 UNITS EX SOLR
OROMUCOSAL | Status: DC | PRN
  Administered 2013-03-13: 15:00:00 via TOPICAL

## 2013-03-13 MED ORDER — MORPHINE SULFATE 2 MG/ML IJ SOLN
2.0000 mg | INTRAMUSCULAR | Status: DC | PRN
  Administered 2013-03-13: 2 mg via INTRAVENOUS
  Filled 2013-03-13: qty 1

## 2013-03-13 MED ORDER — ONDANSETRON HCL 4 MG/2ML IJ SOLN
INTRAMUSCULAR | Status: DC | PRN
  Administered 2013-03-13: 4 mg via INTRAVENOUS

## 2013-03-13 MED ORDER — SODIUM CHLORIDE 0.9 % IV SOLN
10.0000 mg | INTRAVENOUS | Status: DC | PRN
  Administered 2013-03-13: 30 ug/min via INTRAVENOUS

## 2013-03-13 MED ORDER — HYDROMORPHONE HCL PF 1 MG/ML IJ SOLN
INTRAMUSCULAR | Status: AC
  Filled 2013-03-13: qty 1

## 2013-03-13 MED ORDER — ARTIFICIAL TEARS OP OINT
TOPICAL_OINTMENT | OPHTHALMIC | Status: DC | PRN
  Administered 2013-03-13: 1 via OPHTHALMIC

## 2013-03-13 MED ORDER — SODIUM CHLORIDE 0.9 % IR SOLN
Status: DC | PRN
  Administered 2013-03-13: 12:00:00

## 2013-03-13 MED ORDER — GLYCOPYRROLATE 0.2 MG/ML IJ SOLN
INTRAMUSCULAR | Status: DC | PRN
  Administered 2013-03-13: 0.4 mg via INTRAVENOUS

## 2013-03-13 MED ORDER — NEOSTIGMINE METHYLSULFATE 1 MG/ML IJ SOLN
INTRAMUSCULAR | Status: DC | PRN
  Administered 2013-03-13: 3 mg via INTRAVENOUS

## 2013-03-13 MED ORDER — CEFAZOLIN SODIUM-DEXTROSE 2-3 GM-% IV SOLR
INTRAVENOUS | Status: AC
  Administered 2013-03-13: 2 g via INTRAVENOUS
  Filled 2013-03-13: qty 50

## 2013-03-13 MED ORDER — OXYCODONE HCL 5 MG/5ML PO SOLN: 5.0000 mg | Freq: Once | ORAL | Status: AC | PRN

## 2013-03-13 MED ORDER — ROCURONIUM BROMIDE 100 MG/10ML IV SOLN
INTRAVENOUS | Status: DC | PRN
  Administered 2013-03-13: 20 mg via INTRAVENOUS
  Administered 2013-03-13: 50 mg via INTRAVENOUS
  Administered 2013-03-13 (×3): 10 mg via INTRAVENOUS

## 2013-03-13 MED ORDER — FENTANYL CITRATE 0.05 MG/ML IJ SOLN
INTRAMUSCULAR | Status: DC | PRN
  Administered 2013-03-13: 50 ug via INTRAVENOUS
  Administered 2013-03-13: 100 ug via INTRAVENOUS
  Administered 2013-03-13 (×2): 50 ug via INTRAVENOUS

## 2013-03-13 MED ORDER — CEFAZOLIN SODIUM 1-5 GM-% IV SOLN
1.0000 g | Freq: Three times a day (TID) | INTRAVENOUS | Status: AC
  Administered 2013-03-13 – 2013-03-14 (×3): 1 g via INTRAVENOUS
  Filled 2013-03-13 (×3): qty 50

## 2013-03-13 SURGICAL SUPPLY — 54 items
BAG DECANTER FOR FLEXI CONT (MISCELLANEOUS) ×2 IMPLANT
BANDAGE GAUZE ELAST BULKY 4 IN (GAUZE/BANDAGES/DRESSINGS) ×4 IMPLANT
BENZOIN TINCTURE PRP APPL 2/3 (GAUZE/BANDAGES/DRESSINGS) ×2 IMPLANT
BIT DRILL 14MM (INSTRUMENTS) ×1 IMPLANT
BUR MATCHSTICK NEURO 3.0 LAGG (BURR) ×2 IMPLANT
CANISTER SUCTION 2500CC (MISCELLANEOUS) ×2 IMPLANT
CLOTH BEACON ORANGE TIMEOUT ST (SAFETY) ×2 IMPLANT
CONT SPEC 4OZ CLIKSEAL STRL BL (MISCELLANEOUS) ×2 IMPLANT
DRAPE LAPAROTOMY 100X72 PEDS (DRAPES) ×2 IMPLANT
DRAPE MICROSCOPE LEICA (MISCELLANEOUS) ×2 IMPLANT
DRAPE POUCH INSTRU U-SHP 10X18 (DRAPES) ×2 IMPLANT
DRILL 14MM (INSTRUMENTS) ×2
DURAPREP 6ML APPLICATOR 50/CS (WOUND CARE) ×2 IMPLANT
ELECT REM PT RETURN 9FT ADLT (ELECTROSURGICAL) ×2
ELECTRODE REM PT RTRN 9FT ADLT (ELECTROSURGICAL) ×1 IMPLANT
GAUZE SPONGE 4X4 16PLY XRAY LF (GAUZE/BANDAGES/DRESSINGS) IMPLANT
GLOVE BIO SURGEON STRL SZ 6.5 (GLOVE) ×2 IMPLANT
GLOVE BIO SURGEON STRL SZ7 (GLOVE) ×2 IMPLANT
GLOVE ECLIPSE 7.5 STRL STRAW (GLOVE) ×2 IMPLANT
GLOVE EXAM NITRILE LRG STRL (GLOVE) IMPLANT
GLOVE EXAM NITRILE MD LF STRL (GLOVE) IMPLANT
GLOVE EXAM NITRILE XL STR (GLOVE) IMPLANT
GLOVE EXAM NITRILE XS STR PU (GLOVE) IMPLANT
GOWN BRE IMP SLV AUR LG STRL (GOWN DISPOSABLE) ×4 IMPLANT
GOWN BRE IMP SLV AUR XL STRL (GOWN DISPOSABLE) IMPLANT
GOWN STRL REIN 2XL LVL4 (GOWN DISPOSABLE) IMPLANT
HEAD HALTER (SOFTGOODS) ×2 IMPLANT
KIT BASIN OR (CUSTOM PROCEDURE TRAY) ×2 IMPLANT
KIT ROOM TURNOVER OR (KITS) ×2 IMPLANT
NEEDLE HYPO 22GX1.5 SAFETY (NEEDLE) ×2 IMPLANT
NEEDLE HYPO 25X1 1.5 SAFETY (NEEDLE) ×2 IMPLANT
NEEDLE SPNL 20GX3.5 QUINCKE YW (NEEDLE) ×2 IMPLANT
NS IRRIG 1000ML POUR BTL (IV SOLUTION) ×2 IMPLANT
PACK LAMINECTOMY NEURO (CUSTOM PROCEDURE TRAY) ×2 IMPLANT
PAD ARMBOARD 7.5X6 YLW CONV (MISCELLANEOUS) ×6 IMPLANT
PATTIES SURGICAL .75X.75 (GAUZE/BANDAGES/DRESSINGS) ×2 IMPLANT
PIN DISTRACTION 14MM (PIN) ×4 IMPLANT
PLATE 37MM (Plate) ×2 IMPLANT
RUBBERBAND STERILE (MISCELLANEOUS) ×4 IMPLANT
SCREW 14MM (Screw) ×8 IMPLANT
SCREW BONE CANN 14 SS SD STR (Screw) ×4 IMPLANT
SPACER ASSEM CERV LORD 7M (Spacer) ×2 IMPLANT
SPACER ASSEM CERV LORD 8M (Spacer) ×2 IMPLANT
SPONGE GAUZE 4X4 12PLY (GAUZE/BANDAGES/DRESSINGS) ×2 IMPLANT
SPONGE INTESTINAL PEANUT (DISPOSABLE) ×2 IMPLANT
SPONGE LAP 4X18 X RAY DECT (DISPOSABLE) ×4 IMPLANT
STRIP CLOSURE SKIN 1/2X4 (GAUZE/BANDAGES/DRESSINGS) ×2 IMPLANT
SUT VIC AB 3-0 SH 8-18 (SUTURE) ×2 IMPLANT
SYR 20ML ECCENTRIC (SYRINGE) ×2 IMPLANT
TAPE CLOTH SURG 4X10 WHT LF (GAUZE/BANDAGES/DRESSINGS) ×2 IMPLANT
TAPE STRIPS DRAPE STRL (GAUZE/BANDAGES/DRESSINGS) ×2 IMPLANT
TOWEL OR 17X24 6PK STRL BLUE (TOWEL DISPOSABLE) ×2 IMPLANT
TOWEL OR 17X26 10 PK STRL BLUE (TOWEL DISPOSABLE) ×2 IMPLANT
WATER STERILE IRR 1000ML POUR (IV SOLUTION) ×2 IMPLANT

## 2013-03-13 NOTE — Transfer of Care (Signed)
Immediate Anesthesia Transfer of Care Note  Patient: Rodney Sawyer  Procedure(s) Performed: Procedure(s): ANTERIOR CERVICAL DECOMPRESSION/DISCECTOMY FUSION. Cervical four-five, Cervical five-six (N/A)  Patient Location: PACU  Anesthesia Type:General  Level of Consciousness: awake, alert  and oriented  Airway & Oxygen Therapy: Patient Spontanous Breathing and Patient connected to nasal cannula oxygen  Post-op Assessment: Report given to PACU RN and Patient moving all extremities X 4  Post vital signs: Reviewed and stable  Complications: No apparent anesthesia complications

## 2013-03-13 NOTE — Progress Notes (Signed)
Post op check Afen, VSS  Voice ok,  Incision dry - moves al 4 well  - still spastic reflexes and finger jerk  - couple beats clonus bilat  - and some slowed RAM, but stable exam from pre-op  Increase activity as tolerates  - will have tx's eval pt

## 2013-03-13 NOTE — Anesthesia Postprocedure Evaluation (Signed)
Anesthesia Post Note  Patient: Rodney Sawyer  Procedure(s) Performed: Procedure(s) (LRB): ANTERIOR CERVICAL DECOMPRESSION/DISCECTOMY FUSION. Cervical four-five, Cervical five-six (N/A)  Anesthesia type: General  Patient location: PACU  Post pain: Pain level controlled and Adequate analgesia  Post assessment: Post-op Vital signs reviewed, Patient's Cardiovascular Status Stable, Respiratory Function Stable, Patent Airway and Pain level controlled  Last Vitals:  Filed Vitals:   03/13/13 1610  BP: 105/63  Pulse: 69  Temp:   Resp: 16    Post vital signs: Reviewed and stable  Level of consciousness: awake, alert  and oriented  Complications: No apparent anesthesia complications

## 2013-03-13 NOTE — Preoperative (Signed)
Beta Blockers   Reason not to administer Beta Blockers:Not Applicable 

## 2013-03-13 NOTE — Progress Notes (Signed)
Pt with less arm pain  - still "weakness"  Temp:  [97.8 F (36.6 C)-98.6 F (37 C)] 98 F (36.7 C) (08/30 0540) Pulse Rate:  [50-72] 64 (08/30 0540) Resp:  [16-18] 18 (08/30 0540) BP: (110-165)/(68-97) 119/82 mmHg (08/30 0540) SpO2:  [94 %-97 %] 97 % (08/30 0540) Weight:  [107.2 kg (236 lb 5.3 oz)-108.863 kg (240 lb)] 107.2 kg (236 lb 5.3 oz) (08/29 2240) No change  Plan: To OR today

## 2013-03-13 NOTE — Anesthesia Procedure Notes (Signed)
Procedure Name: Intubation Date/Time: 03/13/2013 12:09 PM Performed by: Jefm Miles E Pre-anesthesia Checklist: Patient identified, Timeout performed, Emergency Drugs available, Suction available and Patient being monitored Patient Re-evaluated:Patient Re-evaluated prior to inductionOxygen Delivery Method: Circle system utilized Preoxygenation: Pre-oxygenation with 100% oxygen Intubation Type: IV induction Ventilation: Mask ventilation without difficulty and Oral airway inserted - appropriate to patient size Laryngoscope Size: Mac Grade View: Grade I Tube type: Oral Tube size: 7.5 mm Number of attempts: 1 Airway Equipment and Method: Stylet and Video-laryngoscopy Placement Confirmation: ETT inserted through vocal cords under direct vision,  positive ETCO2 and breath sounds checked- equal and bilateral Secured at: 23 cm Tube secured with: Tape Dental Injury: Teeth and Oropharynx as per pre-operative assessment  Difficulty Due To: Difficult Airway-  due to neck instability and Difficulty was anticipated
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
## 2013-03-13 NOTE — Anesthesia Preprocedure Evaluation (Addendum)
Anesthesia Evaluation  Patient identified by MRN, date of birth, ID band Patient awake    Reviewed: Allergy & Precautions, H&P , NPO status , Patient's Chart, lab work & pertinent test results  Airway Mallampati: II  Neck ROM: full    Dental  (+) Teeth Intact and Dental Advisory Given   Pulmonary neg pulmonary ROS,          Cardiovascular hypertension, + dysrhythmias Atrial Fibrillation     Neuro/Psych    GI/Hepatic   Endo/Other  obese  Renal/GU      Musculoskeletal   Abdominal   Peds  Hematology   Anesthesia Other Findings   Reproductive/Obstetrics                          Anesthesia Physical Anesthesia Plan  ASA: II  Anesthesia Plan: General   Post-op Pain Management:    Induction: Intravenous  Airway Management Planned: Oral ETT  Additional Equipment:   Intra-op Plan:   Post-operative Plan: Extubation in OR  Informed Consent: I have reviewed the patients History and Physical, chart, labs and discussed the procedure including the risks, benefits and alternatives for the proposed anesthesia with the patient or authorized representative who has indicated his/her understanding and acceptance.     Plan Discussed with: CRNA, Anesthesiologist and Surgeon  Anesthesia Plan Comments:         Anesthesia Quick Evaluation

## 2013-03-14 MED ORDER — HYDROCODONE-ACETAMINOPHEN 5-325 MG PO TABS: 1.0000 | ORAL_TABLET | ORAL | Status: DC | PRN

## 2013-03-14 MED ORDER — CYCLOBENZAPRINE HCL 10 MG PO TABS: 10.0000 mg | ORAL_TABLET | Freq: Three times a day (TID) | ORAL | Status: DC | PRN
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
## 2013-03-14 NOTE — Progress Notes (Signed)
Pt has been discharged per neurosurgeon. Spouse reports pt being "little bit confused" and would recommend overnight stay to go home tomorrow. Discharging physician Dr. Caryl Ada made aware of Pt and family decision. Also spouse would like patient to have Home physical therapy recommended per PT and previously refused per pt ordered. Will get order in morning prior to discharge home.

## 2013-03-14 NOTE — Progress Notes (Signed)
Pt c/o burning sensation during IV AntBx administration, upon assessment, IV infiltrated.  IV saline locked, AntBx stopped.  RN notified. 
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
## 2013-03-14 NOTE — Progress Notes (Signed)
Doing well. C/o appropriate incisional soreness. Pt thinks his weakness is almost gone  - has been up ambulating  Temp:  [97.4 F (36.3 C)-98.3 F (36.8 C)] 98.1 F (36.7 C) (08/31 0548) Pulse Rate:  [56-81] 56 (08/31 0548) Resp:  [15-20] 20 (08/31 0548) BP: (95-122)/(47-73) 122/73 mmHg (08/31 0548) SpO2:  [92 %-97 %] 94 % (08/31 0548) RAM much improved  - motor  - 5/5 Still spastic reflexes Incision CDI  Plan: Increase activity  - work with PT/OT today  - ? D/c in next 1-2 days

## 2013-03-14 NOTE — Discharge Summary (Signed)
Physician Discharge Summary  Patient ID: CONSUELO THAYNE MRN: [id number] DOB/AGE: Nov 10, 1945 67 y.o.  Admit date: 03/12/2013 Discharge date: 03/14/2013  Admission Diagnoses:  Discharge Diagnoses:  Active Problems:   * No active hospital problems. *   Discharged Condition: good  Hospital Course: Patient admitted to the hospital where he underwent an urgent two-level anterior cervical decompression and fusion. Postoperatively he is doing quite well. Preoperative neck upper extremity pain and weakness much improved. Patient ambulating without difficulty. Ready for discharge home.  Consults:   Significant Diagnostic Studies:   Treatments:   Discharge Exam: Blood pressure 102/66, pulse 63, temperature 98.1 F (36.7 C), temperature source Oral, resp. rate 20, height 5\' 11"  (1.803 m), weight 107.2 kg (236 lb 5.3 oz), SpO2 93.00%. Awake and alert. Oriented and appropriate. Motor and sensory function intact. Wound clean and dry. Chest and abdomen benign.  Disposition: 01-Home or Self Care     Medication List         aspirin 325 MG tablet  Take 325 mg by mouth daily.     benazepril 20 MG tablet  Commonly known as:  LOTENSIN  Take 40 mg by mouth 2 (two) times daily.     cyclobenzaprine 10 MG tablet  Commonly known as:  FLEXERIL  Take 1 tablet (10 mg total) by mouth 3 (three) times daily as needed for muscle spasms.     diltiazem 180 MG 24 hr capsule  Commonly known as:  DILACOR XR  Take 1 capsule (180 mg total) by mouth 2 (two) times daily.     Fish Oil 1200 MG Caps  Take by mouth daily.     HYDROcodone-acetaminophen 5-325 MG per tablet  Commonly known as:  NORCO/VICODIN  Take 1-2 tablets by mouth every 4 (four) hours as needed.     metoprolol 50 MG tablet  Commonly known as:  LOPRESSOR  Take 1 tablet (50 mg total) by mouth 2 (two) times daily.     rosuvastatin 20 MG tablet  Commonly known as:  CRESTOR  Take 20 mg by mouth See admin instructions. Takes 20 mg on  mondays, wednesdays and fridays           Follow-up Information   Follow up with [facility] R, MD. Call in 1 week.   Specialty:  Neurosurgery   Contact information:   [address] [place] [address] [phone]       Signed: Temple Pacini 03/14/2013, 12:33 PM

## 2013-03-15 NOTE — Progress Notes (Signed)
Doing well plan discharge today.

## 2013-03-15 NOTE — Progress Notes (Signed)
Pt. Discharged home. Cervical surgical instructions given to patient and spouse. Patient and spouse stated understanding of instructions given. Rolling walker also given to patient for home safety use.

## 2013-03-15 NOTE — ED Provider Notes (Signed)
Medical screening examination/treatment/procedure(s) were performed by non-physician practitioner and as supervising physician I was immediately available for consultation/collaboration.   Candyce Churn, MD 03/15/13 2138

## 2013-03-15 NOTE — Plan of Care (Signed)
Problem: Consults Goal: Diagnosis - Spinal Surgery Outcome: Completed/Met Date Met:  03/15/13 Cervical Spine Fusion

## 2013-03-16 ENCOUNTER — Encounter (HOSPITAL_COMMUNITY): Payer: Self-pay | Admitting: Neurosurgery

## 2013-03-18 ENCOUNTER — Telehealth: Payer: Self-pay | Admitting: Internal Medicine
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
## 2013-03-18 NOTE — Telephone Encounter (Signed)
Thank you :)

## 2013-03-18 NOTE — Telephone Encounter (Signed)
Just thought I would pass this along. Kuddos to you for looking at the big picture and doing what was best for this patient!!

## 2013-04-22 ENCOUNTER — Ambulatory Visit (INDEPENDENT_AMBULATORY_CARE_PROVIDER_SITE_OTHER): Payer: Medicare Other | Admitting: Radiology

## 2013-04-22 DIAGNOSIS — Z23 Encounter for immunization: Secondary | ICD-10-CM

## 2013-12-13 DIAGNOSIS — B029 Zoster without complications: Secondary | ICD-10-CM

## 2013-12-13 HISTORY — DX: Zoster without complications: B02.9

## 2014-01-04 ENCOUNTER — Ambulatory Visit (INDEPENDENT_AMBULATORY_CARE_PROVIDER_SITE_OTHER): Payer: Worker's Compensation | Admitting: Internal Medicine

## 2014-01-04 ENCOUNTER — Telehealth: Payer: Self-pay | Admitting: Internal Medicine

## 2014-01-04 ENCOUNTER — Encounter: Payer: Self-pay | Admitting: Internal Medicine

## 2014-01-04 ENCOUNTER — Other Ambulatory Visit: Payer: Self-pay | Admitting: Internal Medicine

## 2014-01-04 ENCOUNTER — Other Ambulatory Visit (INDEPENDENT_AMBULATORY_CARE_PROVIDER_SITE_OTHER): Payer: Medicare Other

## 2014-01-04 VITALS — BP 130/90 | HR 61 | Temp 98.2°F | Ht 71.0 in | Wt 233.0 lb

## 2014-01-04 DIAGNOSIS — E669 Obesity, unspecified: Secondary | ICD-10-CM | POA: Insufficient documentation

## 2014-01-04 DIAGNOSIS — I10 Essential (primary) hypertension: Secondary | ICD-10-CM

## 2014-01-04 DIAGNOSIS — R7309 Other abnormal glucose: Secondary | ICD-10-CM

## 2014-01-04 DIAGNOSIS — E785 Hyperlipidemia, unspecified: Secondary | ICD-10-CM

## 2014-01-04 DIAGNOSIS — IMO0002 Reserved for concepts with insufficient information to code with codable children: Secondary | ICD-10-CM

## 2014-01-04 DIAGNOSIS — B028 Zoster with other complications: Secondary | ICD-10-CM

## 2014-01-04 DIAGNOSIS — M5414 Radiculopathy, thoracic region: Secondary | ICD-10-CM

## 2014-01-04 DIAGNOSIS — B029 Zoster without complications: Secondary | ICD-10-CM

## 2014-01-04 DIAGNOSIS — D126 Benign neoplasm of colon, unspecified: Secondary | ICD-10-CM

## 2014-01-04 LAB — CBC WITH DIFFERENTIAL/PLATELET
Basophils Absolute: 0 10*3/uL (ref 0.0–0.1)
Basophils Relative: 0.3 % (ref 0.0–3.0)
Eosinophils Absolute: 0.2 10*3/uL (ref 0.0–0.7)
Eosinophils Relative: 2.3 % (ref 0.0–5.0)
HCT: 44.8 % (ref 39.0–52.0)
Hemoglobin: 15.1 g/dL (ref 13.0–17.0)
Lymphocytes Relative: 10.7 % — ABNORMAL LOW (ref 12.0–46.0)
Lymphs Abs: 0.9 10*3/uL (ref 0.7–4.0)
MCHC: 33.6 g/dL (ref 30.0–36.0)
MCV: 92.2 fl (ref 78.0–100.0)
Monocytes Absolute: 1 10*3/uL (ref 0.1–1.0)
Monocytes Relative: 11.8 % (ref 3.0–12.0)
Neutro Abs: 6.3 10*3/uL (ref 1.4–7.7)
Neutrophils Relative %: 74.9 % (ref 43.0–77.0)
Platelets: 268 10*3/uL (ref 150.0–400.0)
RBC: 4.86 Mil/uL (ref 4.22–5.81)
RDW: 12.8 % (ref 11.5–15.5)
WBC: 8.4 10*3/uL (ref 4.0–10.5)

## 2014-01-04 LAB — BASIC METABOLIC PANEL
BUN: 18 mg/dL (ref 6–23)
CO2: 27 mEq/L (ref 19–32)
Calcium: 9 mg/dL (ref 8.4–10.5)
Chloride: 102 mEq/L (ref 96–112)
Creatinine, Ser: 1.4 mg/dL (ref 0.4–1.5)
GFR: 54.29 mL/min — ABNORMAL LOW (ref >60.00)
Glucose, Bld: 114 mg/dL — ABNORMAL HIGH (ref 70–99)
Potassium: 4.3 mEq/L (ref 3.5–5.1)
Sodium: 138 mEq/L (ref 135–145)

## 2014-01-04 LAB — HEPATIC FUNCTION PANEL
ALT: 18 U/L (ref 0–53)
AST: 22 U/L (ref 0–37)
Albumin: 4.2 g/dL (ref 3.5–5.2)
Alkaline Phosphatase: 57 U/L (ref 39–117)
Bilirubin, Direct: 0.1 mg/dL (ref 0.0–0.3)
Total Bilirubin: 0.8 mg/dL (ref 0.2–1.2)
Total Protein: 7.1 g/dL (ref 6.0–8.3)

## 2014-01-04 LAB — HEMOGLOBIN A1C: Hgb A1c MFr Bld: 5.7 % (ref 4.6–6.5)

## 2014-01-04 LAB — TSH: TSH: 1.49 u[IU]/mL (ref 0.35–4.50)

## 2014-01-04 MED ORDER — BENAZEPRIL HCL 20 MG PO TABS: 40.0000 mg | ORAL_TABLET | Freq: Two times a day (BID) | ORAL | Status: DC

## 2014-01-04 MED ORDER — DILTIAZEM HCL ER 180 MG PO CP24: 180.0000 mg | ORAL_CAPSULE | Freq: Two times a day (BID) | ORAL | Status: DC

## 2014-01-04 MED ORDER — METOPROLOL TARTRATE 50 MG PO TABS: 50.0000 mg | ORAL_TABLET | Freq: Two times a day (BID) | ORAL | Status: DC

## 2014-01-04 MED ORDER — GABAPENTIN 100 MG PO CAPS: ORAL_CAPSULE | ORAL | Status: DC

## 2014-01-04 MED ORDER — VALACYCLOVIR HCL 1 G PO TABS: 1000.0000 mg | ORAL_TABLET | Freq: Three times a day (TID) | ORAL | Status: DC

## 2014-01-04 NOTE — Progress Notes (Signed)
Pre visit review using our clinic review tool, if applicable. No additional management support is needed unless otherwise documented below in the visit note. 

## 2014-01-04 NOTE — Assessment & Plan Note (Signed)
A1c , urine microalbumin, BMET 

## 2014-01-04 NOTE — Assessment & Plan Note (Signed)
Blood pressure goals reviewed. BMET 

## 2014-01-04 NOTE — Assessment & Plan Note (Addendum)
NMR lipoprofile, LFTs, TSH

## 2014-01-04 NOTE — Patient Instructions (Signed)
Your next office appointment will be determined based upon review of your pending labs . Those instructions will be transmitted to you through My Chart .  Followup as needed for your acute issue. Please report any significant change in your symptoms. 

## 2014-01-04 NOTE — Telephone Encounter (Signed)
Pt called stated that [facility] in [place] will not give them medications that Dr. Linna Darner send in due to address is not correct. Enter the correct address in the system, please please check with [facility] so pt can have his med ASAP.
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
## 2014-01-05 ENCOUNTER — Telehealth: Payer: Self-pay | Admitting: Internal Medicine

## 2014-01-05 NOTE — Telephone Encounter (Signed)
Relevant patient education mailed to patient.Q

## 2014-01-06 LAB — NMR LIPOPROFILE WITH LIPIDS
Cholesterol, Total: 156 mg/dL (ref <200)
HDL Particle Number: 22.8 umol/L — ABNORMAL LOW (ref >=30.5)
HDL Size: 8.7 nm — ABNORMAL LOW (ref >=9.2)
HDL-C: 37 mg/dL — ABNORMAL LOW (ref >=40)
LDL (calc): 83 mg/dL (ref <100)
LDL Particle Number: 1513 nmol/L — ABNORMAL HIGH (ref <1000)
LDL Size: 20.2 nm — ABNORMAL LOW (ref >20.5)
LP-IR Score: 44 (ref <=45)
Large HDL-P: 3.2 umol/L — ABNORMAL LOW (ref >=4.8)
Large VLDL-P: <0.8 nmol/L (ref <=2.7)
Small LDL Particle Number: 998 nmol/L — ABNORMAL HIGH (ref <=527)
Triglycerides: 182 mg/dL — ABNORMAL HIGH (ref <150)
VLDL Size: 43.8 nm (ref <=46.6)

## 2014-01-09 ENCOUNTER — Encounter: Payer: Self-pay | Admitting: Internal Medicine

## 2014-01-10 ENCOUNTER — Encounter: Payer: Self-pay | Admitting: Internal Medicine

## 2014-01-13 ENCOUNTER — Telehealth: Payer: Self-pay | Admitting: *Deleted

## 2014-01-13 NOTE — Telephone Encounter (Signed)
Pt states md had double his benazapril. Wanting to clarify if he suppose to be taking 2 tablets twice a day. Pharmacist stated he is over taking the limit. Pls verify what he need to be taking...Rodney Sawyer

## 2014-01-14 NOTE — Telephone Encounter (Signed)
Verify BP average on 20 mg TWO pills bid. Goal = < 140/90. Decrease to 20 mg bid if BP averages < 120/60.

## 2014-01-17 NOTE — Telephone Encounter (Signed)
Called pt.

## 2014-01-17 NOTE — Addendum Note (Signed)
Addended by: Earnstine Regal on: 01/17/2014 10:28 AM   Modules accepted: Orders

## 2014-01-17 NOTE — Telephone Encounter (Signed)
Notified pt clarified md request below. Pt stated his BP has been running fine anywhere from 120-130 /60-70. Will continue taking 20 mg bid...Rodney Sawyer

## 2014-01-28 ENCOUNTER — Ambulatory Visit (INDEPENDENT_AMBULATORY_CARE_PROVIDER_SITE_OTHER): Payer: Medicare Other | Admitting: Internal Medicine

## 2014-01-28 ENCOUNTER — Telehealth: Payer: Self-pay | Admitting: Internal Medicine

## 2014-01-28 ENCOUNTER — Encounter: Payer: Self-pay | Admitting: Internal Medicine

## 2014-01-28 VITALS — BP 108/72 | HR 60 | Temp 98.2°F | Wt 233.0 lb

## 2014-01-28 DIAGNOSIS — B029 Zoster without complications: Secondary | ICD-10-CM

## 2014-01-28 DIAGNOSIS — F32A Depression, unspecified: Secondary | ICD-10-CM

## 2014-01-28 DIAGNOSIS — F329 Major depressive disorder, single episode, unspecified: Secondary | ICD-10-CM

## 2014-01-28 DIAGNOSIS — F341 Dysthymic disorder: Secondary | ICD-10-CM

## 2014-01-28 DIAGNOSIS — K3189 Other diseases of stomach and duodenum: Secondary | ICD-10-CM

## 2014-01-28 DIAGNOSIS — F419 Anxiety disorder, unspecified: Secondary | ICD-10-CM

## 2014-01-28 DIAGNOSIS — R1013 Epigastric pain: Secondary | ICD-10-CM

## 2014-01-28 MED ORDER — CITALOPRAM HYDROBROMIDE 20 MG PO TABS: 20.0000 mg | ORAL_TABLET | Freq: Every day | ORAL | Status: DC

## 2014-01-28 MED ORDER — GABAPENTIN 300 MG PO CAPS: 300.0000 mg | ORAL_CAPSULE | Freq: Three times a day (TID) | ORAL | Status: DC

## 2014-01-28 MED ORDER — OMEPRAZOLE 20 MG PO CPDR: 20.0000 mg | DELAYED_RELEASE_CAPSULE | Freq: Every day | ORAL | Status: DC

## 2014-01-28 NOTE — Telephone Encounter (Signed)
Caller name: Pamala Hurry  Call back [place]:  Reason for call: pt is wanting this RX called ometrazole 20mg  as an actual script.  The OTC is too expensive and they can get it much cheaper if it is called in as a script.  Please call pt when complete.

## 2014-01-28 NOTE — Progress Notes (Signed)
Pre visit review using our clinic review tool, if applicable. No additional management support is needed unless otherwise documented below in the visit note. 

## 2014-01-28 NOTE — Patient Instructions (Addendum)
Start omeprazole 20 mg OTC one before breakfast Start citalopram 20 mg: 1/2 tab at bedtime  x 10 days , then 1 tablet at bedtime  Next visit 6 weeks
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
## 2014-01-28 NOTE — Telephone Encounter (Signed)
lmovm

## 2014-01-29 DIAGNOSIS — R1013 Epigastric pain: Secondary | ICD-10-CM | POA: Insufficient documentation

## 2014-01-29 DIAGNOSIS — F329 Major depressive disorder, single episode, unspecified: Secondary | ICD-10-CM | POA: Insufficient documentation

## 2014-01-29 DIAGNOSIS — F419 Anxiety disorder, unspecified: Secondary | ICD-10-CM | POA: Insufficient documentation

## 2014-01-29 DIAGNOSIS — F32A Depression, unspecified: Secondary | ICD-10-CM | POA: Insufficient documentation

## 2014-01-29 NOTE — Assessment & Plan Note (Signed)
Depression, anxiety, PHQ 9-- 7 (mild depression) w/ somehow difficult funtioning Discussing options including counseling - medications. He is counseled here today, In the past he try citalopram we good results and would like to try again. Plan:  Start meds, s/e discussed

## 2014-01-29 NOTE — Assessment & Plan Note (Signed)
  Dyspepsia, request a referral to Dr Deatra Ina, EGD? Start omeprazole OTC Also due for a cscope

## 2014-02-02 ENCOUNTER — Encounter: Payer: Self-pay | Admitting: Gastroenterology

## 2014-03-14 ENCOUNTER — Encounter: Payer: Self-pay | Admitting: Internal Medicine

## 2014-03-14 ENCOUNTER — Ambulatory Visit (INDEPENDENT_AMBULATORY_CARE_PROVIDER_SITE_OTHER): Payer: Medicare Other | Admitting: Internal Medicine

## 2014-03-14 VITALS — BP 130/68 | HR 59 | Temp 98.3°F | Wt 228.0 lb

## 2014-03-14 DIAGNOSIS — I10 Essential (primary) hypertension: Secondary | ICD-10-CM

## 2014-03-14 DIAGNOSIS — K3189 Other diseases of stomach and duodenum: Secondary | ICD-10-CM

## 2014-03-14 DIAGNOSIS — F341 Dysthymic disorder: Secondary | ICD-10-CM

## 2014-03-14 DIAGNOSIS — F32A Depression, unspecified: Secondary | ICD-10-CM

## 2014-03-14 DIAGNOSIS — R1013 Epigastric pain: Secondary | ICD-10-CM

## 2014-03-14 DIAGNOSIS — F329 Major depressive disorder, single episode, unspecified: Secondary | ICD-10-CM

## 2014-03-14 DIAGNOSIS — F419 Anxiety disorder, unspecified: Secondary | ICD-10-CM

## 2014-03-14 NOTE — Progress Notes (Signed)
Pre visit review using our clinic review tool, if applicable. No additional management support is needed unless otherwise documented below in the visit note. 

## 2014-03-14 NOTE — Assessment & Plan Note (Signed)
Good compliance with medications, ambulatory BPs are checked ~3-4 times a month on are  normal, BP today very good. Plan: Check BMP, last creatinine slightly elevated

## 2014-03-14 NOTE — Assessment & Plan Note (Signed)
Patient started omeprazole and is essentially asymptomatic.  Does have a colonoscopy scheduled which is routine
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
## 2014-03-14 NOTE — Patient Instructions (Signed)
Get your blood work before you leave   Next visit is for a physical exam in 6 months , fasting if possible  Please make an appointment

## 2014-03-14 NOTE — Assessment & Plan Note (Signed)
On citalopram 20 mg daily, feeling great, wife states that he is doing very well. Plan: No change

## 2014-03-15 LAB — BASIC METABOLIC PANEL
BUN: 20 mg/dL (ref 6–23)
CO2: 25 mEq/L (ref 19–32)
Calcium: 8.8 mg/dL (ref 8.4–10.5)
Chloride: 105 mEq/L (ref 96–112)
Creatinine, Ser: 1 mg/dL (ref 0.4–1.5)
GFR: 76.05 mL/min (ref >60.00)
Glucose, Bld: 75 mg/dL (ref 70–99)
Potassium: 4.2 mEq/L (ref 3.5–5.1)
Sodium: 139 mEq/L (ref 135–145)

## 2014-04-04 ENCOUNTER — Telehealth: Payer: Self-pay | Admitting: Internal Medicine

## 2014-04-04 NOTE — Telephone Encounter (Signed)
Caller name: Maler,Barbara Relation to pt: spouse  Call back number: [phone] Pharmacy:  Reason for call: pt would like a pneumonia vaccine please advise.

## 2014-04-04 NOTE — Telephone Encounter (Signed)
Pt can get Prevnar 13, he can get it in February, his next appt or can make a nurse visit appt to get it.

## 2014-04-14 ENCOUNTER — Ambulatory Visit (INDEPENDENT_AMBULATORY_CARE_PROVIDER_SITE_OTHER): Payer: Medicare Other | Admitting: Gastroenterology

## 2014-04-14 ENCOUNTER — Encounter: Payer: Self-pay | Admitting: Gastroenterology

## 2014-04-14 VITALS — BP 126/72 | HR 64 | Ht 71.0 in | Wt 229.4 lb

## 2014-04-14 DIAGNOSIS — R1013 Epigastric pain: Secondary | ICD-10-CM

## 2014-04-14 DIAGNOSIS — D126 Benign neoplasm of colon, unspecified: Secondary | ICD-10-CM

## 2014-04-14 DIAGNOSIS — Z86010 Personal history of colonic polyps: Secondary | ICD-10-CM

## 2014-04-14 NOTE — Assessment & Plan Note (Signed)
Nonspecific dyspepsia-resolved with empiric therapy with omeprazole.

## 2014-04-14 NOTE — Patient Instructions (Signed)

## 2014-04-14 NOTE — Assessment & Plan Note (Signed)
Plan followup colonoscopy 
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
## 2014-04-22 ENCOUNTER — Encounter: Payer: Self-pay | Admitting: Gastroenterology

## 2014-05-03 ENCOUNTER — Ambulatory Visit (INDEPENDENT_AMBULATORY_CARE_PROVIDER_SITE_OTHER): Payer: Medicare Other

## 2014-05-03 DIAGNOSIS — Z23 Encounter for immunization: Secondary | ICD-10-CM

## 2014-05-09 ENCOUNTER — Telehealth: Payer: Self-pay | Admitting: Gastroenterology

## 2014-05-09 MED ORDER — NA SULFATE-K SULFATE-MG SULF 17.5-3.13-1.6 GM/177ML PO SOLN: 1.0000 | Freq: Once | ORAL | Status: DC

## 2014-05-09 NOTE — Telephone Encounter (Signed)
L/m that suprep was sent to the pharmacy

## 2014-05-25 ENCOUNTER — Other Ambulatory Visit: Payer: Self-pay | Admitting: Internal Medicine

## 2014-05-31 ENCOUNTER — Encounter: Payer: Self-pay | Admitting: Gastroenterology

## 2014-05-31 ENCOUNTER — Ambulatory Visit (AMBULATORY_SURGERY_CENTER): Payer: Medicare Other | Admitting: Gastroenterology

## 2014-05-31 VITALS — BP 124/72 | HR 49 | Temp 96.6°F | Resp 14 | Ht 71.0 in | Wt 229.0 lb

## 2014-05-31 DIAGNOSIS — D125 Benign neoplasm of sigmoid colon: Secondary | ICD-10-CM

## 2014-05-31 DIAGNOSIS — D126 Benign neoplasm of colon, unspecified: Secondary | ICD-10-CM

## 2014-05-31 DIAGNOSIS — D128 Benign neoplasm of rectum: Secondary | ICD-10-CM

## 2014-05-31 DIAGNOSIS — D129 Benign neoplasm of anus and anal canal: Secondary | ICD-10-CM

## 2014-05-31 DIAGNOSIS — Z86010 Personal history of colonic polyps: Secondary | ICD-10-CM

## 2014-05-31 DIAGNOSIS — D120 Benign neoplasm of cecum: Secondary | ICD-10-CM

## 2014-05-31 MED ORDER — SODIUM CHLORIDE 0.9 % IV SOLN: 500.0000 mL | INTRAVENOUS | Status: DC

## 2014-05-31 NOTE — Progress Notes (Signed)
Called to room to assist during endoscopic procedure.  Patient ID and intended procedure confirmed with present staff. Received instructions for my participation in the procedure from the performing physician.  

## 2014-05-31 NOTE — Op Note (Signed)
[facility]  [address] [place], [address]   COLONOSCOPY PROCEDURE REPORT  PATIENT: Rodney Sawyer, Rodney Sawyer  MR#: [id number] BIRTHDATE: Jan 27, 1946 , 32  yrs. old GENDER: male ENDOSCOPIST: Inda Castle, MD REFERRED GM:[id number] Linna Darner, M.D. PROCEDURE DATE:  05/31/2014 PROCEDURE:   Colonoscopy with cold biopsy polypectomy and Colonoscopy with snare polypectomy First Screening Colonoscopy - Avg.  risk and is 50 yrs.  old or older - No.  Prior Negative Screening - Now for repeat screening. N/A  History of Adenoma - Now for follow-up colonoscopy & has been > or = to 3 yrs.  Yes hx of adenoma.  Has been 3 or more years since last colonoscopy.  Polyps Removed Today? Yes. ASA CLASS:   Class II INDICATIONS:high risk personal history of colonic polyps 2003; 2008 colonoscopy negative for polyps MEDICATIONS: Monitored anesthesia care and Propofol 200 mg IV  DESCRIPTION OF PROCEDURE:   After the risks benefits and alternatives of the procedure were thoroughly explained, informed consent was obtained.  The digital rectal exam revealed no abnormalities of the rectum.   The [id number]  endoscope was introduced through the anus and advanced to the cecum, which was identified by both the appendix and ileocecal valve. No adverse events experienced.   The quality of the prep was excellent using Suprep  The instrument was then slowly withdrawn as the colon was fully examined.      COLON FINDINGS: A sessile polyp measuring 10 mm in size was found at the cecum.  A polypectomy was performed with a cold snare.  The resection was complete, the polyp tissue was completely retrieved and sent to histology.   A flat polyp measuring 2 mm in size was found in the sigmoid colon.  A polypectomy was performed with cold forceps.   A sessile polyp measuring 5 mm in size was found in the rectum.  A polypectomy was performed with a cold snare.  The resection was complete, the polyp tissue  was completely retrieved and sent to histology.  Retroflexed views revealed no abnormalities. The time to cecum=4 minutes 30 seconds.  Withdrawal time=8 minutes 59 seconds.  The scope was withdrawn and the procedure completed. COMPLICATIONS: There were no immediate complications.  ENDOSCOPIC IMPRESSION: 1.   Sessile polyp measuring 10 mm in size was found at the cecum; polypectomy was performed with a cold snare 2.   Flat polyp was found in the sigmoid colon; polypectomy was performed with cold forceps 3.   Sessile polyp was found in the rectum; polypectomy was performed with a cold snare  RECOMMENDATIONS: If the polyp(s) removed today are proven to be adenomatous (pre-cancerous) polyps, you will need a colonoscopy in 3 years. Otherwise you should continue to follow colorectal cancer screening guidelines for "routine risk" patients with a colonoscopy in 10 years.  You will receive a letter within 1-2 weeks with the results of your biopsy as well as final recommendations.  Please call my office if you have not received a letter after 3 weeks.  eSigned:  Inda Castle, MD 05/31/2014 4:10 PM   cc:   PATIENT NAME:  Eluzer, [place] MR#: [id number]

## 2014-05-31 NOTE — Progress Notes (Signed)
Patient awakening,vss,report to rn 

## 2014-05-31 NOTE — Patient Instructions (Signed)
YOU HAD AN ENDOSCOPIC PROCEDURE TODAY AT THE [facility]: Refer to the procedure report that was given to you for any specific questions about what was found during the examination.  If the procedure report does not answer your questions, please call your gastroenterologist to clarify.  If you requested that your care partner not be given the details of your procedure findings, then the procedure report has been included in a sealed envelope for you to review at your convenience later.  YOU SHOULD EXPECT: Some feelings of bloating in the abdomen. Passage of more gas than usual.  Walking can help get rid of the air that was put into your GI tract during the procedure and reduce the bloating. If you had a lower endoscopy (such as a colonoscopy or flexible sigmoidoscopy) you may notice spotting of blood in your stool or on the toilet paper. If you underwent a bowel prep for your procedure, then you may not have a normal bowel movement for a few days.  DIET: Your first meal following the procedure should be a light meal and then it is ok to progress to your normal diet.  A half-sandwich or bowl of soup is an example of a good first meal.  Heavy or fried foods are harder to digest and may make you feel nauseous or bloated.  Likewise meals heavy in dairy and vegetables can cause extra gas to form and this can also increase the bloating.  Drink plenty of fluids but you should avoid alcoholic beverages for 24 hours.  ACTIVITY: Your care partner should take you home directly after the procedure.  You should plan to take it easy, moving slowly for the rest of the day.  You can resume normal activity the day after the procedure however you should NOT DRIVE or use heavy machinery for 24 hours (because of the sedation medicines used during the test).    SYMPTOMS TO REPORT IMMEDIATELY: A gastroenterologist can be reached at any hour.  During normal business hours, 8:30 AM to 5:00 PM Monday through Friday,  call [phone].  After hours and on weekends, please call the GI answering service at [phone] who will take a message and have the physician on call contact you.   Following lower endoscopy (colonoscopy or flexible sigmoidoscopy):  Excessive amounts of blood in the stool  Significant tenderness or worsening of abdominal pains  Swelling of the abdomen that is new, acute  Fever of 100F or higher  FOLLOW UP: If any biopsies were taken you will be contacted by phone or by letter within the next 1-3 weeks.  Call your gastroenterologist if you have not heard about the biopsies in 3 weeks.  Our staff will call the home number listed on your records the next business day following your procedure to check on you and address any questions or concerns that you may have at that time regarding the information given to you following your procedure. This is a courtesy call and so if there is no answer at the home number and we have not heard from you through the emergency physician on call, we will assume that you have returned to your regular daily activities without incident.  SIGNATURES/CONFIDENTIALITY: You and/or your care partner have signed paperwork which will be entered into your electronic medical record.  These signatures attest to the fact that that the information above on your After Visit Summary has been reviewed and is understood.  Full responsibility of the confidentiality of this   information lies with you and/or your care-partner.    Handout was given to your care partner on polyps. You may resume your current medications today. Await biopsy results. Please call if any questions or concerns.   

## 2014-06-01 ENCOUNTER — Telehealth: Payer: Self-pay | Admitting: *Deleted

## 2014-06-01 NOTE — Telephone Encounter (Signed)
  Follow up Call-  Call back number 05/31/2014  Post procedure Call Back phone  # [phone]  Permission to leave phone message Yes     Patient questions:  Do you have a fever, pain , or abdominal swelling? No. Pain Score  0 *  Have you tolerated food without any problems? Yes.    Have you been able to return to your normal activities? Yes.    Do you have any questions about your discharge instructions: Diet   No. Medications  No. Follow up visit  No.  Do you have questions or concerns about your Care? No.  Actions: * If pain score is 4 or above: No action needed, pain <4.

## 2014-06-07 ENCOUNTER — Encounter: Payer: Self-pay | Admitting: Gastroenterology

## 2014-06-12 ENCOUNTER — Encounter (HOSPITAL_COMMUNITY): Payer: Self-pay | Admitting: *Deleted

## 2014-06-12 ENCOUNTER — Emergency Department (HOSPITAL_COMMUNITY)
Admission: EM | Admit: 2014-06-12 | Discharge: 2014-06-12 | Disposition: A | Discharge status: Home / Self Care | Payer: Medicare Other | Attending: Emergency Medicine | Admitting: Emergency Medicine

## 2014-06-12 DIAGNOSIS — I10 Essential (primary) hypertension: Secondary | ICD-10-CM | POA: Insufficient documentation

## 2014-06-12 DIAGNOSIS — I480 Paroxysmal atrial fibrillation: Secondary | ICD-10-CM

## 2014-06-12 DIAGNOSIS — Z79899 Other long term (current) drug therapy: Secondary | ICD-10-CM | POA: Diagnosis not present

## 2014-06-12 DIAGNOSIS — Z7982 Long term (current) use of aspirin: Secondary | ICD-10-CM | POA: Diagnosis not present

## 2014-06-12 DIAGNOSIS — Z8619 Personal history of other infectious and parasitic diseases: Secondary | ICD-10-CM | POA: Diagnosis not present

## 2014-06-12 DIAGNOSIS — F329 Major depressive disorder, single episode, unspecified: Secondary | ICD-10-CM | POA: Diagnosis not present

## 2014-06-12 DIAGNOSIS — Z85828 Personal history of other malignant neoplasm of skin: Secondary | ICD-10-CM | POA: Insufficient documentation

## 2014-06-12 DIAGNOSIS — I499 Cardiac arrhythmia, unspecified: Secondary | ICD-10-CM | POA: Diagnosis present

## 2014-06-12 DIAGNOSIS — E785 Hyperlipidemia, unspecified: Secondary | ICD-10-CM | POA: Insufficient documentation

## 2014-06-12 LAB — BASIC METABOLIC PANEL
Anion gap: 14 (ref 5–15)
BUN: 21 mg/dL (ref 6–23)
CO2: 23 mEq/L (ref 19–32)
Calcium: 9.1 mg/dL (ref 8.4–10.5)
Chloride: 102 mEq/L (ref 96–112)
Creatinine, Ser: 1.07 mg/dL (ref 0.50–1.35)
GFR calc Af Amer: 80 mL/min — ABNORMAL LOW (ref >90)
GFR calc non Af Amer: 69 mL/min — ABNORMAL LOW (ref >90)
Glucose, Bld: 133 mg/dL — ABNORMAL HIGH (ref 70–99)
Potassium: 4.1 mEq/L (ref 3.7–5.3)
Sodium: 139 mEq/L (ref 137–147)

## 2014-06-12 LAB — I-STAT TROPONIN, ED: Troponin i, poc: 0 ng/mL (ref 0.00–0.08)

## 2014-06-12 LAB — CBC
HCT: 45.4 % (ref 39.0–52.0)
Hemoglobin: 15.4 g/dL (ref 13.0–17.0)
MCH: 31 pg (ref 26.0–34.0)
MCHC: 33.9 g/dL (ref 30.0–36.0)
MCV: 91.3 fL (ref 78.0–100.0)
Platelets: 245 10*3/uL (ref 150–400)
RBC: 4.97 MIL/uL (ref 4.22–5.81)
RDW: 13.3 % (ref 11.5–15.5)
WBC: 7.5 10*3/uL (ref 4.0–10.5)

## 2014-06-12 LAB — MAGNESIUM: Magnesium: 2.1 mg/dL (ref 1.5–2.5)

## 2014-06-12 MED ORDER — DILTIAZEM HCL 25 MG/5ML IV SOLN
10.0000 mg | Freq: Once | INTRAVENOUS | Status: AC
  Administered 2014-06-12: 10 mg via INTRAVENOUS

## 2014-06-12 MED ORDER — DILTIAZEM HCL 25 MG/5ML IV SOLN
20.0000 mg | Freq: Once | INTRAVENOUS | Status: DC
  Filled 2014-06-12: qty 5

## 2014-06-12 MED ORDER — DILTIAZEM HCL 100 MG IV SOLR: 5.0000 mg/h | Freq: Once | INTRAVENOUS | Status: DC

## 2014-06-12 NOTE — ED Notes (Signed)
Pt has history of afib and states that he can feel that he has went into afib.  No sob or chest pain

## 2014-06-12 NOTE — ED Provider Notes (Signed)
CSN: [id number]     Arrival date & time 06/12/14  0036 History  This chart was scribed for Johnna Acosta, MD by Peyton Bottoms, ED Scribe. This patient was seen in room A05C/A05C and the patient's care was started at 1:38 AM.   Chief Complaint  Patient presents with  . Irregular Heart Beat   The history is provided by the patient. No language interpreter was used.    HPI Comments: Rodney Sawyer is a 68 y.o. male with a history of Paroxysmal Atrial Fibrilation who presents to the Emergency Department complaining of irregular heart beat and symptoms of Afib that began at 11:30PM today when patient was getting ready to go to bed. He states that this is his 3rd episode of afib. His last episode of afib was 5 years ago. He states that he has never been shocked for his afib symptoms in the past. He states he currently takes aspirin and a beta blocker. He states he drinks 1 glass of red wine per day. He currently denies associated chest pain, palpitations, fever, chills, vomiting, diarrhea, sinus congestion, rhinorrhea or cough. Pt is seen by Dr. Gilda Crease. His PCP is Dr. Kathlene November. He had a normal echogram and stress test in the past 6 years.  Past Medical History  Diagnosis Date  . Hyperlipidemia   . Hypertension   . AF (paroxysmal atrial fibrillation)     [place] [facility]  . Shingles 12-2013  . Skin cancer     Basil cell  . Depression   . Hepatitis A 1960    containmented water at school when he was a child   Past Surgical History  Procedure Laterality Date  . Vasectomy    . Tonsillectomy and adenoidectomy    . Colonoscopy w/ polypectomy  2001    negative 2006;[facility]  . Cystoscopy  2004    Negative; done for microscopic hematuria, asymptomatic  . Anterior cervical decomp/discectomy fusion N/A 03/13/2013    Procedure: ANTERIOR CERVICAL DECOMPRESSION/DISCECTOMY FUSION. Cervical four-five, Cervical five-six;  Surgeon: Otilio Connors, MD;  Location: [facility] NEURO ORS;  Service: Neurosurgery;   Laterality: N/A;   Family History  Problem Relation Age of Onset  . Heart attack Paternal Uncle 3    sudden death  . Alzheimer's disease Father   . Alzheimer's disease Mother   . Stroke Paternal Grandfather 73  . Cancer Neg Hx   . Diabetes Neg Hx   . Colon cancer Neg Hx    History  Substance Use Topics  . Smoking status: Never Smoker   . Smokeless tobacco: Never Used  . Alcohol Use: 4.2 oz/week    7 Glasses of wine per week     Comment: socially    Review of Systems  Cardiovascular:       Irregular heart beat  All other systems reviewed and are negative.  Allergies  Review of patient's allergies indicates no known allergies.  Home Medications   Prior to Admission medications   Medication Sig Start Date End Date Taking? Authorizing Provider  aspirin 325 MG tablet Take 325 mg by mouth daily.   Yes Historical Provider, MD  benazepril (LOTENSIN) 20 MG tablet Take 20 mg by mouth 2 (two) times daily. 01/04/14  Yes Hendricks Limes, MD  citalopram (CELEXA) 20 MG tablet TAKE 1 TABLET BY MOUTH DAILY 05/25/14  Yes Colon Branch, MD  diclofenac (VOLTAREN) 75 MG EC tablet Take 75 mg by mouth 2 (two) times daily as needed for mild pain.  Prescribed by Dr Erline Levine   Yes Historical Provider, MD  diltiazem (DILACOR XR) 180 MG 24 hr capsule Take 1 capsule (180 mg total) by mouth 2 (two) times daily. 01/04/14  Yes Hendricks Limes, MD  metoprolol (LOPRESSOR) 50 MG tablet Take 1 tablet (50 mg total) by mouth 2 (two) times daily. 01/04/14  Yes Hendricks Limes, MD  Omega-3 Fatty Acids (FISH OIL) 1200 MG CAPS Take 1 capsule by mouth daily.    Yes Historical Provider, MD  omeprazole (PRILOSEC) 20 MG capsule Take 1 capsule (20 mg total) by mouth daily. 01/28/14  Yes Colon Branch, MD  rosuvastatin (CRESTOR) 20 MG tablet Take 20 mg by mouth See admin instructions. Takes 20 mg on mondays, wednesdays and fridays   Yes Historical Provider, MD  gabapentin (NEURONTIN) 300 MG capsule Take 300 mg by mouth 2  (two) times daily as needed. 01/28/14   Colon Branch, MD   Triage Vitals: BP 115/82 mmHg  Pulse 135  Temp(Src) 97.4 F (36.3 C) (Oral)  Resp 20  SpO2 94%  Physical Exam  Constitutional: He appears well-developed and well-nourished. No distress.  HENT:  Head: Normocephalic and atraumatic.  Mouth/Throat: Oropharynx is clear and moist. No oropharyngeal exudate.  Eyes: Conjunctivae and EOM are normal. Pupils are equal, round, and reactive to light. Right eye exhibits no discharge. Left eye exhibits no discharge. No scleral icterus.  Neck: Normal range of motion. Neck supple. No JVD present. No thyromegaly present.  Cardiovascular: Normal heart sounds and intact distal pulses.  A regularly irregular rhythm present. Tachycardia present.  Exam reveals no gallop and no friction rub.   No murmur heard. Pulmonary/Chest: Effort normal and breath sounds normal. No respiratory distress. He has no wheezes. He has no rales.  Abdominal: Soft. Bowel sounds are normal. He exhibits no distension and no mass. There is no tenderness.  Musculoskeletal: Normal range of motion. He exhibits no edema or tenderness.  Lymphadenopathy:    He has no cervical adenopathy.  Neurological: He is alert. Coordination normal.  Skin: Skin is warm and dry. No rash noted. No erythema.  Psychiatric: He has a normal mood and affect. His behavior is normal.  Nursing note and vitals reviewed.  ED Course  Procedures (including critical care time)  DIAGNOSTIC STUDIES: Oxygen Saturation is 94% on RA, adequate by my interpretation.    COORDINATION OF CARE: 1:44 AM- Discussed plans to order diagnostic EKG, and lab work. Will give pt Cardizem. Pt advised of plan for treatment and pt agrees.  2:28 AM- Converted back to sinus rhythm without meds  Labs Review Labs Reviewed  BASIC METABOLIC PANEL - Abnormal; Notable for the following:    Glucose, Bld 133 (*)    GFR calc non Af Amer 69 (*)    GFR calc Af Amer 80 (*)    All other  components within normal limits  CBC  MAGNESIUM  I-STAT TROPOININ, ED   Imaging Review No results found.  Initial ECG showed afib at pulse of 113, LAD and Non specific T wave findings   EKG Interpretation   Date/Time:  Sunday June 12 2014 02:16:31 EST Ventricular Rate:  68 PR Interval:  160 QRS Duration: 102 QT Interval:  398 QTC Calculation: 423 R Axis:   -38 Text Interpretation:  Sinus rhythm Left axis deviation Abnormal R-wave  progression, early transition Abnormal ekg Since last tracing Atrial  fibrillation has resolved Confirmed by Terre Hanneman  MD, [place] ([id number]) on  06/12/2014 3:21:41 AM  MDM   Final diagnoses:  Paroxysmal atrial fibrillation    W/u unremarkable - converted wtihout meds, stable VS, amenable to d/c to f/u with cards  I personally performed the services described in this documentation, which was scribed in my presence. The recorded information has been reviewed and is accurate.   Johnna Acosta, MD 06/12/14 [phone]

## 2014-06-12 NOTE — ED Notes (Signed)
Discharge instructions reviewed, voiced understanding.

## 2014-06-14 ENCOUNTER — Other Ambulatory Visit: Payer: Self-pay

## 2014-06-22 ENCOUNTER — Ambulatory Visit: Payer: Medicare Other | Admitting: Internal Medicine

## 2014-06-22 ENCOUNTER — Telehealth: Payer: Self-pay | Admitting: *Deleted

## 2014-06-22 DIAGNOSIS — I480 Paroxysmal atrial fibrillation: Secondary | ICD-10-CM

## 2014-06-22 NOTE — Telephone Encounter (Signed)
Pt did not show for appointment 06/22/2014 at 8:30am for er follow up

## 2014-06-22 NOTE — Telephone Encounter (Signed)
LMOM for Pt to return call.  

## 2014-06-22 NOTE — Telephone Encounter (Signed)
Please call the patient, see how he's doing.  Arrange a cardiology referral-- dx paroxysmal atrial fibrillation. If he has any symptoms needs to either call or go to emergency room

## 2014-06-22 NOTE — Telephone Encounter (Signed)
See note below

## 2014-06-22 NOTE — Telephone Encounter (Signed)
Referral placed to cardiology 

## 2014-06-23 NOTE — Telephone Encounter (Signed)
Pt has appt with cardio, Dr. Johnsie Cancel, on 07/19/2014 1145.

## 2014-07-01 DIAGNOSIS — Z0271 Encounter for disability determination: Secondary | ICD-10-CM

## 2014-07-19 ENCOUNTER — Ambulatory Visit: Payer: Self-pay | Admitting: Cardiovascular Disease

## 2014-07-25 ENCOUNTER — Encounter: Payer: Self-pay | Admitting: *Deleted
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
## 2014-07-26 ENCOUNTER — Encounter: Payer: Self-pay | Admitting: Cardiovascular Disease

## 2014-07-26 ENCOUNTER — Ambulatory Visit (INDEPENDENT_AMBULATORY_CARE_PROVIDER_SITE_OTHER): Payer: Medicare Other | Admitting: Cardiovascular Disease

## 2014-07-26 VITALS — BP 130/86 | HR 53 | Ht 71.0 in | Wt 237.4 lb

## 2014-07-26 DIAGNOSIS — I480 Paroxysmal atrial fibrillation: Secondary | ICD-10-CM

## 2014-07-26 DIAGNOSIS — E785 Hyperlipidemia, unspecified: Secondary | ICD-10-CM

## 2014-07-26 DIAGNOSIS — I10 Essential (primary) hypertension: Secondary | ICD-10-CM

## 2014-07-26 MED ORDER — FLECAINIDE ACETATE 100 MG PO TABS: 300.0000 mg | ORAL_TABLET | ORAL | Status: DC | PRN

## 2014-07-26 NOTE — Assessment & Plan Note (Signed)
Well controlled.  Continue current medications and low sodium Dash type diet.    

## 2014-07-26 NOTE — Patient Instructions (Signed)

## 2014-07-26 NOTE — Assessment & Plan Note (Signed)
Cholesterol is at goal.  Continue current dose of statin and diet Rx.  No myalgias or side effects.  F/U  LFT's in 6 months. Lab Results  Component Value Date   LDLCALC 83 01/04/2014

## 2014-07-26 NOTE — Assessment & Plan Note (Signed)
Infrequent  Continue beta blocker and cardizem  Pill in pocket flecainide prescribed.  Update Echo make sure EF still normal and assess atrial sizes.  No anticoagulation at this time

## 2014-08-03 ENCOUNTER — Other Ambulatory Visit (HOSPITAL_COMMUNITY): Payer: Self-pay

## 2014-08-04 ENCOUNTER — Ambulatory Visit (HOSPITAL_COMMUNITY): Payer: Medicare Other | Attending: Cardiovascular Disease | Admitting: Radiology

## 2014-08-04 DIAGNOSIS — I480 Paroxysmal atrial fibrillation: Secondary | ICD-10-CM | POA: Diagnosis present

## 2014-08-04 DIAGNOSIS — I10 Essential (primary) hypertension: Secondary | ICD-10-CM | POA: Diagnosis not present

## 2014-08-04 DIAGNOSIS — E669 Obesity, unspecified: Secondary | ICD-10-CM | POA: Insufficient documentation

## 2014-08-04 DIAGNOSIS — E785 Hyperlipidemia, unspecified: Secondary | ICD-10-CM | POA: Insufficient documentation

## 2014-08-04 NOTE — Progress Notes (Signed)
Echocardiogram performed.  

## 2014-08-09 ENCOUNTER — Telehealth: Payer: Self-pay | Admitting: Cardiovascular Disease

## 2014-08-09 NOTE — Telephone Encounter (Signed)
PT AWARE OF ECHO RESULTS./CY 

## 2014-08-09 NOTE — Telephone Encounter (Signed)
New Msg          Pt returning call, please contact on cell [phone].

## 2014-08-25 ENCOUNTER — Other Ambulatory Visit: Payer: Self-pay

## 2014-08-25 MED ORDER — OMEPRAZOLE 20 MG PO CPDR: 20.0000 mg | DELAYED_RELEASE_CAPSULE | Freq: Every day | ORAL | Status: DC

## 2014-09-12 ENCOUNTER — Encounter: Payer: Self-pay | Admitting: Internal Medicine

## 2014-09-12 ENCOUNTER — Ambulatory Visit (INDEPENDENT_AMBULATORY_CARE_PROVIDER_SITE_OTHER): Payer: Medicare Other | Admitting: Internal Medicine

## 2014-09-12 VITALS — BP 118/74 | HR 57 | Temp 98.3°F | Ht 71.0 in | Wt 237.5 lb

## 2014-09-12 DIAGNOSIS — I480 Paroxysmal atrial fibrillation: Secondary | ICD-10-CM

## 2014-09-12 DIAGNOSIS — Z0000 Encounter for general adult medical examination without abnormal findings: Secondary | ICD-10-CM

## 2014-09-12 DIAGNOSIS — E785 Hyperlipidemia, unspecified: Secondary | ICD-10-CM

## 2014-09-12 DIAGNOSIS — I10 Essential (primary) hypertension: Secondary | ICD-10-CM

## 2014-09-12 DIAGNOSIS — Z125 Encounter for screening for malignant neoplasm of prostate: Secondary | ICD-10-CM

## 2014-09-12 DIAGNOSIS — Z23 Encounter for immunization: Secondary | ICD-10-CM

## 2014-09-12 NOTE — Assessment & Plan Note (Signed)
On Crestor, recent LFTs normal, due for a FLP
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
## 2014-09-12 NOTE — Assessment & Plan Note (Addendum)
Since the episode 05-2014 that brought him to the ER he has been asymptomatic. saw cardiology few months ago and felt to be stable

## 2014-09-12 NOTE — Assessment & Plan Note (Signed)
Seems well-controlled, recent BMP satisfactory

## 2014-09-12 NOTE — Patient Instructions (Signed)
Please come back fasting for labs Tomorrow    Please go to the front and schedule a visit  6 MONTHS  for a physical exam

## 2014-09-12 NOTE — Progress Notes (Signed)
Pre visit review using our clinic review tool, if applicable. No additional management support is needed unless otherwise documented below in the visit note. 

## 2014-09-12 NOTE — Assessment & Plan Note (Signed)
Recommend to come back for a physical in 6 months. Prevnar today Prostate cancer screening: DRE done 12-2012 by previous PCP negative, checking a PSA Had a colonoscopy 05-2014, 3 polyps, next in 3 years

## 2014-09-13 ENCOUNTER — Other Ambulatory Visit: Payer: Medicare Other

## 2014-09-13 LAB — LIPID PANEL
Cholesterol: 129 mg/dL (ref 0–200)
HDL: 33.8 mg/dL — ABNORMAL LOW (ref >39.00)
LDL Cholesterol: 71 mg/dL (ref 0–99)
NonHDL: 95.2
Total CHOL/HDL Ratio: 4
Triglycerides: 123 mg/dL (ref 0.0–149.0)
VLDL: 24.6 mg/dL (ref 0.0–40.0)

## 2014-09-13 LAB — PSA: PSA: 0.45 ng/mL (ref 0.10–4.00)

## 2014-10-27 ENCOUNTER — Other Ambulatory Visit: Payer: Self-pay | Admitting: Internal Medicine

## 2014-12-16 ENCOUNTER — Other Ambulatory Visit: Payer: Self-pay | Admitting: Internal Medicine

## 2014-12-16 NOTE — Telephone Encounter (Signed)
2 bp med rx have been sent to pharm, but patient needs office visit before any further refills

## 2015-02-02 ENCOUNTER — Other Ambulatory Visit: Payer: Self-pay | Admitting: Internal Medicine

## 2015-02-06 ENCOUNTER — Telehealth: Payer: Self-pay | Admitting: Internal Medicine

## 2015-02-06 MED ORDER — DILTIAZEM HCL ER 180 MG PO CP24: 180.0000 mg | ORAL_CAPSULE | Freq: Every day | ORAL | Status: DC

## 2015-02-06 NOTE — Telephone Encounter (Signed)
Refills sent to [facility]. 

## 2015-02-06 NOTE — Telephone Encounter (Signed)
Relation to pt: self  Call back number: [phone] Pharmacy: [id number] - [place], [place] [phone] (Phone) [phone] (Fax)         Reason for call:  Patient requesting a refill DILT-XR 180 MG 24 hr capsule

## 2015-02-08 ENCOUNTER — Telehealth: Payer: Self-pay | Admitting: Internal Medicine

## 2015-02-08 MED ORDER — DILTIAZEM HCL ER 180 MG PO CP24: 180.0000 mg | ORAL_CAPSULE | Freq: Two times a day (BID) | ORAL | Status: DC

## 2015-02-08 NOTE — Telephone Encounter (Signed)
Rx was sent #60 and 2 refills, enough for twice daily. Changed sig to 1 tablet twice daily and resent to [facility] as requested.

## 2015-02-08 NOTE — Telephone Encounter (Signed)
Reason for call: pt called bc rx for diltiazem was sent in wrong. Pt is taking 1 capsule by mouth 2 times per day. Please revise and sent to [facility] on [address].

## 2015-02-27 ENCOUNTER — Encounter: Payer: Self-pay | Admitting: *Deleted

## 2015-02-27 ENCOUNTER — Telehealth: Payer: Self-pay | Admitting: *Deleted

## 2015-02-27 NOTE — Telephone Encounter (Signed)
Pre-Visit Call completed with patient and chart updated.   Pre-Visit Info documented in Specialty Comments under [employer].    

## 2015-02-28 ENCOUNTER — Encounter: Payer: Self-pay | Admitting: Internal Medicine

## 2015-02-28 ENCOUNTER — Ambulatory Visit (INDEPENDENT_AMBULATORY_CARE_PROVIDER_SITE_OTHER): Payer: Medicare Other | Admitting: Internal Medicine

## 2015-02-28 VITALS — BP 122/72 | HR 54 | Temp 97.7°F | Ht 71.0 in | Wt 241.0 lb

## 2015-02-28 DIAGNOSIS — E785 Hyperlipidemia, unspecified: Secondary | ICD-10-CM | POA: Diagnosis not present

## 2015-02-28 DIAGNOSIS — R7309 Other abnormal glucose: Secondary | ICD-10-CM | POA: Diagnosis not present

## 2015-02-28 DIAGNOSIS — R7303 Prediabetes: Secondary | ICD-10-CM

## 2015-02-28 DIAGNOSIS — I10 Essential (primary) hypertension: Secondary | ICD-10-CM

## 2015-02-28 DIAGNOSIS — Z0000 Encounter for general adult medical examination without abnormal findings: Secondary | ICD-10-CM | POA: Diagnosis not present

## 2015-02-28 DIAGNOSIS — Z1159 Encounter for screening for other viral diseases: Secondary | ICD-10-CM

## 2015-02-28 LAB — BASIC METABOLIC PANEL
BUN: 23 mg/dL (ref 6–23)
CO2: 28 mEq/L (ref 19–32)
Calcium: 8.9 mg/dL (ref 8.4–10.5)
Chloride: 104 mEq/L (ref 96–112)
Creatinine, Ser: 1.03 mg/dL (ref 0.40–1.50)
GFR: 75.84 mL/min (ref >60.00)
Glucose, Bld: 111 mg/dL — ABNORMAL HIGH (ref 70–99)
Potassium: 3.9 mEq/L (ref 3.5–5.1)
Sodium: 139 mEq/L (ref 135–145)

## 2015-02-28 LAB — HEMOGLOBIN A1C: Hgb A1c MFr Bld: 5.7 % (ref 4.6–6.5)

## 2015-02-28 LAB — AST: AST: 14 U/L (ref 0–37)

## 2015-02-28 LAB — ALT: ALT: 12 U/L (ref 0–53)

## 2015-02-28 MED ORDER — METOPROLOL TARTRATE 50 MG PO TABS
50.0000 mg | ORAL_TABLET | Freq: Two times a day (BID) | ORAL | Status: DC
Start: 2015-02-28 — End: 2016-02-05

## 2015-02-28 NOTE — Assessment & Plan Note (Signed)
On Crestor, last FLP satisfactory, check LFTs

## 2015-02-28 NOTE — Patient Instructions (Addendum)
Get your blood work before you leave   Please get a flu shot this fall   Please consider visit these websites for more information:  www.begintheconversation.org  theconversationproject.org    Fall Prevention and Home Safety Falls cause injuries and can affect all age groups. It is possible to use preventive measures to significantly decrease the likelihood of falls. There are many simple measures which can make your home safer and prevent falls. OUTDOORS  Repair cracks and edges of walkways and driveways.  Remove high doorway thresholds.  Trim shrubbery on the main path into your home.  Have good outside lighting.  Clear walkways of tools, rocks, debris, and clutter.  Check that handrails are not broken and are securely fastened. Both sides of steps should have handrails.  Have leaves, snow, and ice cleared regularly.  Use sand or salt on walkways during winter months.  In the garage, clean up grease or oil spills. BATHROOM  Install night lights.  Install grab bars by the toilet and in the tub and shower.  Use non-skid mats or decals in the tub or shower.  Place a plastic non-slip stool in the shower to sit on, if needed.  Keep floors dry and clean up all water on the floor immediately.  Remove soap buildup in the tub or shower on a regular basis.  Secure bath mats with non-slip, double-sided rug tape.  Remove throw rugs and tripping hazards from the floors. BEDROOMS  Install night lights.  Make sure a bedside light is easy to reach.  Do not use oversized bedding.  Keep a telephone by your bedside.  Have a firm chair with side arms to use for getting dressed.  Remove throw rugs and tripping hazards from the floor. KITCHEN  Keep handles on pots and pans turned toward the center of the stove. Use back burners when possible.  Clean up spills quickly and allow time for drying.  Avoid walking on wet floors.  Avoid hot utensils and knives.  Position  shelves so they are not too high or low.  Place commonly used objects within easy reach.  If necessary, use a sturdy step stool with a grab bar when reaching.  Keep electrical cables out of the way.  Do not use floor polish or wax that makes floors slippery. If you must use wax, use non-skid floor wax.  Remove throw rugs and tripping hazards from the floor. STAIRWAYS  Never leave objects on stairs.  Place handrails on both sides of stairways and use them. Fix any loose handrails. Make sure handrails on both sides of the stairways are as long as the stairs.  Check carpeting to make sure it is firmly attached along stairs. Make repairs to worn or loose carpet promptly.  Avoid placing throw rugs at the top or bottom of stairways, or properly secure the rug with carpet tape to prevent slippage. Get rid of throw rugs, if possible.  Have an electrician put in a light switch at the top and bottom of the stairs. OTHER FALL PREVENTION TIPS  Wear low-heel or rubber-soled shoes that are supportive and fit well. Wear closed toe shoes.  When using a stepladder, make sure it is fully opened and both spreaders are firmly locked. Do not climb a closed stepladder.  Add color or contrast paint or tape to grab bars and handrails in your home. Place contrasting color strips on first and last steps.  Learn and use mobility aids as needed. Install an electrical emergency response system.  Turn on lights to avoid dark areas. Replace light bulbs that burn out immediately. Get light switches that glow.  Arrange furniture to create clear pathways. Keep furniture in the same place.  Firmly attach carpet with non-skid or double-sided tape.  Eliminate uneven floor surfaces.  Select a carpet pattern that does not visually hide the edge of steps.  Be aware of all pets. OTHER HOME SAFETY TIPS  Set the water temperature for 120 F (48.8 C).  Keep emergency numbers on or near the telephone.  Keep  smoke detectors on every level of the home and near sleeping areas. Document Released: 06/21/2002 Document Revised: 12/31/2011 Document Reviewed: 09/20/2011 [facility] Patient Information 2015 [place], [place]. This information is not intended to replace advice given to you by your health care provider. Make sure you discuss any questions you have with your health care provider.   Preventive Care for Adults Ages 57 and over  Blood pressure check.** / Every 1 to 2 years.  Lipid and cholesterol check.**/ Every 5 years beginning at age 47.  Lung cancer screening. / Every year if you are aged 55-80 years and have a 30-pack-year history of smoking and currently smoke or have quit within the past 15 years. Yearly screening is stopped once you have quit smoking for at least 15 years or develop a health problem that would prevent you from having lung cancer treatment.  Fecal occult blood test (FOBT) of stool. / Every year beginning at age 32 and continuing until age 87. You may not have to do this test if you get a colonoscopy every 10 years.  Flexible sigmoidoscopy** or colonoscopy.** / Every 5 years for a flexible sigmoidoscopy or every 10 years for a colonoscopy beginning at age 59 and continuing until age 23.  Hepatitis C blood test.** / For all people born from [birth year] through 1966 and any individual with known risks for hepatitis C.  Abdominal aortic aneurysm (AAA) screening.** / A one-time screening for ages 28 to 7 years who are current or former smokers.  Skin self-exam. / Monthly.  Influenza vaccine. / Every year.  Tetanus, diphtheria, and acellular pertussis (Tdap/Td) vaccine.** / 1 dose of Td every 10 years.  Varicella vaccine.** / Consult your health care provider.  Zoster vaccine.** / 1 dose for adults aged 58 years or older.  Pneumococcal 13-valent conjugate (PCV13) vaccine.** / Consult your health care provider.  Pneumococcal polysaccharide (PPSV23) vaccine.** / 1 dose for all  adults aged 80 years and older.  Meningococcal vaccine.** / Consult your health care provider.  Hepatitis A vaccine.** / Consult your health care provider.  Hepatitis B vaccine.** / Consult your health care provider.  Haemophilus influenzae type b (Hib) vaccine.** / Consult your health care provider. **Family history and personal history of risk and conditions may change your health care provider's recommendations. Document Released: 08/27/2001 Document Revised: 07/06/2013 Document Reviewed: 11/26/2010 [facility] Patient Information 2015 [place], [place]. This information is not intended to replace advice given to you by your health care provider. Make sure you discuss any questions you have with your health care provider.
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
## 2015-02-28 NOTE — Assessment & Plan Note (Signed)
Under excellent control, check a BMP

## 2015-02-28 NOTE — Progress Notes (Signed)
Pre visit review using our clinic review tool, if applicable. No additional management support is needed unless otherwise documented below in the visit note. 

## 2015-02-28 NOTE — Assessment & Plan Note (Signed)
A1c consistently under 6, he is now less active than before, check A1c. Encourage a healthy diet

## 2015-02-28 NOTE — Assessment & Plan Note (Addendum)
Immunizations: Td 2010, Continental 2013, Prevnar 08-2014, had a zostavax  Prostate cancer screening: DRE done 12-2012 by previous PCP negative, 09-2014  PSA normal  Had a colonoscopy 05-2014, 3 polyps, next in 3 years Counseled about diet and exercise.

## 2015-03-01 LAB — HEPATITIS C ANTIBODY: HCV Ab: NEGATIVE

## 2015-03-02 ENCOUNTER — Ambulatory Visit: Payer: Self-pay | Admitting: Cardiovascular Disease

## 2015-03-06 ENCOUNTER — Other Ambulatory Visit: Payer: Self-pay | Admitting: Internal Medicine

## 2015-03-06 MED ORDER — CITALOPRAM HYDROBROMIDE 20 MG PO TABS: 20.0000 mg | ORAL_TABLET | Freq: Every day | ORAL | Status: DC

## 2015-03-06 MED ORDER — OMEPRAZOLE 20 MG PO CPDR: 20.0000 mg | DELAYED_RELEASE_CAPSULE | Freq: Every day | ORAL | Status: DC

## 2015-03-06 NOTE — Telephone Encounter (Signed)
Caller name: Rickard Kennerly Relationship to patient: Self  Can be reached: [phone] (home)   [phone] (cell)  Pharmacy:  Reason for call: pt need a refill on 2 medications. 1. Citalopram 90 quantity if possible (per pt) 2. Omeprazole 90 also if possible (per pt)

## 2015-03-06 NOTE — Telephone Encounter (Signed)
Spoke with pt and sent refills to [facility].

## 2015-03-13 ENCOUNTER — Encounter: Payer: Self-pay | Admitting: Internal Medicine

## 2015-03-14 ENCOUNTER — Ambulatory Visit: Payer: Self-pay | Admitting: Cardiovascular Disease

## 2015-03-31 ENCOUNTER — Ambulatory Visit (INDEPENDENT_AMBULATORY_CARE_PROVIDER_SITE_OTHER): Payer: Medicare Other | Admitting: *Deleted

## 2015-03-31 DIAGNOSIS — Z23 Encounter for immunization: Secondary | ICD-10-CM | POA: Diagnosis not present

## 2015-04-10 ENCOUNTER — Encounter: Payer: Self-pay | Admitting: *Deleted
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
## 2015-04-11 ENCOUNTER — Encounter: Payer: Self-pay | Admitting: Cardiovascular Disease

## 2015-04-11 ENCOUNTER — Ambulatory Visit (INDEPENDENT_AMBULATORY_CARE_PROVIDER_SITE_OTHER): Payer: Medicare Other | Admitting: Cardiovascular Disease

## 2015-04-11 VITALS — BP 110/70 | HR 52 | Ht 71.0 in | Wt 241.0 lb

## 2015-04-11 DIAGNOSIS — I480 Paroxysmal atrial fibrillation: Secondary | ICD-10-CM | POA: Diagnosis not present

## 2015-04-11 DIAGNOSIS — I10 Essential (primary) hypertension: Secondary | ICD-10-CM | POA: Diagnosis not present

## 2015-04-11 NOTE — Patient Instructions (Signed)
Medication Instructions:  Your physician recommends that you continue on your current medications as directed. Please refer to the Current Medication list given to you today.   Labwork: NONE Testing/Procedures: NONE  Follow-Up: Your physician wants you to follow-up in: [place] will receive a reminder letter in the mail two months in advance. If you don't receive a letter, please call our office to schedule the follow-up appointment.    Any Other Special Instructions Will Be Listed Below (If Applicable).

## 2015-04-18 ENCOUNTER — Telehealth: Payer: Self-pay | Admitting: Internal Medicine

## 2015-04-18 MED ORDER — DILTIAZEM HCL ER 180 MG PO CP24: 180.0000 mg | ORAL_CAPSULE | Freq: Two times a day (BID) | ORAL | Status: DC

## 2015-04-18 NOTE — Telephone Encounter (Signed)
Relation to FX:OVAN Call back number:[phone] Pharmacy: [facility] ([place] [place], [place] [phone])         Reason for call:  Patient requesting 90 day supply diltiazem (DILT-XR) 180 MG 24 hr capsule

## 2015-04-18 NOTE — Telephone Encounter (Signed)
Rx sent to [facility].   

## 2015-05-19 ENCOUNTER — Telehealth: Payer: Self-pay | Admitting: Internal Medicine

## 2015-05-19 MED ORDER — BENAZEPRIL HCL 20 MG PO TABS: 20.0000 mg | ORAL_TABLET | Freq: Two times a day (BID) | ORAL | Status: DC

## 2015-05-19 NOTE — Telephone Encounter (Signed)
Rx sent 

## 2015-05-19 NOTE — Telephone Encounter (Addendum)
Relation to RX:YVOP Call back number:[phone] Pharmacy: [facility] ([place] [place], [place] [phone] (Phone) [phone])         Reason for call:  Patient requesting a refill benazepril (LOTENSIN) 20 MG tablet

## 2015-09-07 ENCOUNTER — Telehealth: Payer: Self-pay | Admitting: Internal Medicine

## 2015-09-07 MED ORDER — CITALOPRAM HYDROBROMIDE 20 MG PO TABS: 20.0000 mg | ORAL_TABLET | Freq: Every day | ORAL | Status: DC

## 2015-09-07 NOTE — Telephone Encounter (Signed)
Rx sent. Please inform Pt that he is due for his routine 6 month follow-up w/ Dr. Larose Kells.

## 2015-09-07 NOTE — Telephone Encounter (Signed)
Caller name: Self  Can be reached: [phone]  Pharmacy:  [id number] - [place], [place] [phone]) [phone] (Fax)         Reason for call: Request refill on citalopram (CELEXA) 20 MG tablet [id number]

## 2015-09-15 ENCOUNTER — Telehealth: Payer: Self-pay | Admitting: Internal Medicine

## 2015-09-15 MED ORDER — OMEPRAZOLE 20 MG PO CPDR: 20.0000 mg | DELAYED_RELEASE_CAPSULE | Freq: Every day | ORAL | Status: DC | PRN

## 2015-09-15 NOTE — Telephone Encounter (Signed)
Rx sent 

## 2015-09-15 NOTE — Telephone Encounter (Signed)
Relation to [id number] Call back number:[phone] Pharmacy: [id number] - [place], [place] [phone]) [phone] (Fax)         Reason for call:  Patient requesting a 90 day supply omeprazole (PRILOSEC) 20 MG capsule. Patient has a 6 month follow up scheduled for 10/02/2015

## 2015-09-15 NOTE — Telephone Encounter (Signed)
Left message for patient to call and schedule follow up

## 2015-10-02 ENCOUNTER — Encounter: Payer: Self-pay | Admitting: Internal Medicine

## 2015-10-02 ENCOUNTER — Ambulatory Visit (INDEPENDENT_AMBULATORY_CARE_PROVIDER_SITE_OTHER): Payer: Medicare Other | Admitting: Internal Medicine

## 2015-10-02 VITALS — BP 124/78 | HR 54 | Temp 98.2°F | Ht 71.0 in | Wt 241.1 lb

## 2015-10-02 DIAGNOSIS — F329 Major depressive disorder, single episode, unspecified: Secondary | ICD-10-CM

## 2015-10-02 DIAGNOSIS — I4891 Unspecified atrial fibrillation: Secondary | ICD-10-CM

## 2015-10-02 DIAGNOSIS — I10 Essential (primary) hypertension: Secondary | ICD-10-CM

## 2015-10-02 DIAGNOSIS — E785 Hyperlipidemia, unspecified: Secondary | ICD-10-CM

## 2015-10-02 DIAGNOSIS — F419 Anxiety disorder, unspecified: Secondary | ICD-10-CM

## 2015-10-02 DIAGNOSIS — Z09 Encounter for follow-up examination after completed treatment for conditions other than malignant neoplasm: Secondary | ICD-10-CM | POA: Insufficient documentation

## 2015-10-02 DIAGNOSIS — F418 Other specified anxiety disorders: Secondary | ICD-10-CM

## 2015-10-02 DIAGNOSIS — F32A Depression, unspecified: Secondary | ICD-10-CM

## 2015-10-02 LAB — LIPID PANEL
Cholesterol: 139 mg/dL (ref 0–200)
HDL: 32.1 mg/dL — ABNORMAL LOW (ref >39.00)
LDL Cholesterol: 80 mg/dL (ref 0–99)
NonHDL: 107.18
Total CHOL/HDL Ratio: 4
Triglycerides: 137 mg/dL (ref 0.0–149.0)
VLDL: 27.4 mg/dL (ref 0.0–40.0)

## 2015-10-02 LAB — BASIC METABOLIC PANEL
BUN: 25 mg/dL — ABNORMAL HIGH (ref 6–23)
CO2: 25 mEq/L (ref 19–32)
Calcium: 9.1 mg/dL (ref 8.4–10.5)
Chloride: 103 mEq/L (ref 96–112)
Creatinine, Ser: 1.17 mg/dL (ref 0.40–1.50)
GFR: 65.36 mL/min (ref >60.00)
Glucose, Bld: 116 mg/dL — ABNORMAL HIGH (ref 70–99)
Potassium: 4.1 mEq/L (ref 3.5–5.1)
Sodium: 137 mEq/L (ref 135–145)

## 2015-10-02 LAB — TSH: TSH: 1.21 u[IU]/mL (ref 0.35–4.50)
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
## 2015-10-02 NOTE — Assessment & Plan Note (Signed)
HTN: Seems well-controlled, continue Lotensin, Lopressor. Check a BMP Hyperlipidemia; Continue Crestor, check a FLP Depression:  he feels better,gradually decreased citalopram, see instructions. RTC 02-2016, CPX

## 2015-10-02 NOTE — Progress Notes (Signed)
Pre visit review using our clinic review tool, if applicable. No additional management support is needed unless otherwise documented below in the visit note. 

## 2015-10-02 NOTE — Patient Instructions (Signed)
GO TO THE LAB :      Get the blood work     GO TO THE FRONT DESK  Schedule your next appointment for a  Physical exam When?   By [id number]  Fasting?  Yes   ---------------------- Decrease citalopram to 1/2 tablet a day x 3 weeks then stop If needed, take 1/2 tablet every other day x 3 weeks then stop

## 2015-10-25 ENCOUNTER — Telehealth: Payer: Self-pay | Admitting: Internal Medicine

## 2015-10-25 MED ORDER — BENAZEPRIL HCL 20 MG PO TABS: 20.0000 mg | ORAL_TABLET | Freq: Two times a day (BID) | ORAL | Status: DC

## 2015-10-25 NOTE — Telephone Encounter (Signed)
Caller name: Self  Can be reached: [phone]   Pharmacy:  [id number] - [place], [place] [phone] (Phone) [phone] (Fax)        Reason for call: Request refill on benazepril (LOTENSIN) 20 MG tablet [id number]

## 2015-10-25 NOTE — Telephone Encounter (Signed)
Rx sent 

## 2015-10-31 ENCOUNTER — Other Ambulatory Visit: Payer: Self-pay | Admitting: Internal Medicine

## 2015-10-31 MED ORDER — CITALOPRAM HYDROBROMIDE 20 MG PO TABS: 20.0000 mg | ORAL_TABLET | Freq: Every day | ORAL | Status: DC

## 2015-10-31 NOTE — Telephone Encounter (Signed)
Rx sent, Pt informed via MyChart.  

## 2015-12-14 DIAGNOSIS — B360 Pityriasis versicolor: Secondary | ICD-10-CM | POA: Diagnosis not present

## 2015-12-14 DIAGNOSIS — Z85828 Personal history of other malignant neoplasm of skin: Secondary | ICD-10-CM | POA: Diagnosis not present

## 2015-12-14 DIAGNOSIS — L821 Other seborrheic keratosis: Secondary | ICD-10-CM

## 2015-12-14 DIAGNOSIS — Z08 Encounter for follow-up examination after completed treatment for malignant neoplasm: Secondary | ICD-10-CM | POA: Diagnosis not present

## 2015-12-14 DIAGNOSIS — D485 Neoplasm of uncertain behavior of skin: Secondary | ICD-10-CM | POA: Diagnosis not present

## 2015-12-14 HISTORY — DX: Other seborrheic keratosis: L82.1

## 2016-02-05 ENCOUNTER — Other Ambulatory Visit: Payer: Self-pay | Admitting: Internal Medicine

## 2016-02-26 ENCOUNTER — Other Ambulatory Visit: Payer: Self-pay

## 2016-02-26 MED ORDER — DILTIAZEM HCL ER 180 MG PO CP24: 180.0000 mg | ORAL_CAPSULE | Freq: Two times a day (BID) | ORAL | 0 refills | Status: DC

## 2016-03-06 ENCOUNTER — Encounter: Payer: Self-pay | Admitting: Internal Medicine

## 2016-03-06 ENCOUNTER — Ambulatory Visit (INDEPENDENT_AMBULATORY_CARE_PROVIDER_SITE_OTHER): Payer: Medicare Other | Admitting: Internal Medicine

## 2016-03-06 VITALS — BP 112/80 | HR 57 | Temp 97.9°F | Resp 17 | Ht 71.0 in | Wt 240.0 lb

## 2016-03-06 DIAGNOSIS — Z0000 Encounter for general adult medical examination without abnormal findings: Secondary | ICD-10-CM | POA: Diagnosis not present

## 2016-03-06 DIAGNOSIS — I10 Essential (primary) hypertension: Secondary | ICD-10-CM | POA: Diagnosis not present

## 2016-03-06 DIAGNOSIS — E785 Hyperlipidemia, unspecified: Secondary | ICD-10-CM

## 2016-03-06 LAB — CBC WITH DIFFERENTIAL/PLATELET
Basophils Absolute: 0 10*3/uL (ref 0.0–0.1)
Basophils Relative: 0.3 % (ref 0.0–3.0)
Eosinophils Absolute: 0.5 10*3/uL (ref 0.0–0.7)
Eosinophils Relative: 6.2 % — ABNORMAL HIGH (ref 0.0–5.0)
HCT: 43.9 % (ref 39.0–52.0)
Hemoglobin: 15.1 g/dL (ref 13.0–17.0)
Lymphocytes Relative: 25.2 % (ref 12.0–46.0)
Lymphs Abs: 2.1 10*3/uL (ref 0.7–4.0)
MCHC: 34.3 g/dL (ref 30.0–36.0)
MCV: 90.7 fl (ref 78.0–100.0)
Monocytes Absolute: 0.6 10*3/uL (ref 0.1–1.0)
Monocytes Relative: 7.5 % (ref 3.0–12.0)
Neutro Abs: 5 10*3/uL (ref 1.4–7.7)
Neutrophils Relative %: 60.8 % (ref 43.0–77.0)
Platelets: 249 10*3/uL (ref 150.0–400.0)
RBC: 4.84 Mil/uL (ref 4.22–5.81)
RDW: 13.7 % (ref 11.5–15.5)
WBC: 8.3 10*3/uL (ref 4.0–10.5)

## 2016-03-06 LAB — BASIC METABOLIC PANEL
BUN: 13 mg/dL (ref 6–23)
CO2: 28 mEq/L (ref 19–32)
Calcium: 8.5 mg/dL (ref 8.4–10.5)
Chloride: 104 mEq/L (ref 96–112)
Creatinine, Ser: 1.04 mg/dL (ref 0.40–1.50)
GFR: 74.78 mL/min (ref >60.00)
Glucose, Bld: 103 mg/dL — ABNORMAL HIGH (ref 70–99)
Potassium: 4.2 mEq/L (ref 3.5–5.1)
Sodium: 140 mEq/L (ref 135–145)

## 2016-03-06 LAB — ALT: ALT: 12 U/L (ref 0–53)

## 2016-03-06 LAB — AST: AST: 17 U/L (ref 0–37)

## 2016-03-06 NOTE — Assessment & Plan Note (Addendum)
Immunizations: Td 2010, [place] 2013, Prevnar 08-2014, had a zostavax  Prostate cancer screening: DRE done 12-2012 by previous PCP negative, 09-2014  PSA normal ; reassess screening 2018 Had a colonoscopy 05-2014, 3 polyps, next in 3 years Counseled about diet and exercise. Also fall prevention, benefits of a healthcare power of attorney.

## 2016-03-06 NOTE — Assessment & Plan Note (Signed)
Prediabetes: Encouraged healthy diet, A1c has been stable for years. HTN: Continue Lotensin, diltiazem, Lopressor. Check a BMP and CBC High cholesterol: On Crestor, last FLP satisfactory, check LFTs Depression:   On Celexa, feeling well, he tried to wean off and felt okay but his wife asked him to go back on a full dose. Patient is not sure why (did he got irritable w/o SSRIs?) Okay to try wean off  again. See instructions Cerumen problems: Recommend to continue H2 O2 RTC 6-8 months

## 2016-03-06 NOTE — Patient Instructions (Signed)
Get your blood work before you leave     Next visit in 6-8 months  Citalopram: Take half tablet daily for a month, then half tablet every other day for a month,  then stop. Okay to go back on a full dose if needed. Call if questions    Fall Prevention and Home Safety Falls cause injuries and can affect all age groups. It is possible to use preventive measures to significantly decrease the likelihood of falls. There are many simple measures which can make your home safer and prevent falls. OUTDOORS  Repair cracks and edges of walkways and driveways.  Remove high doorway thresholds.  Trim shrubbery on the main path into your home.  Have good outside lighting.  Clear walkways of tools, rocks, debris, and clutter.  Check that handrails are not broken and are securely fastened. Both sides of steps should have handrails.  Have leaves, snow, and ice cleared regularly.  Use sand or salt on walkways during winter months.  In the garage, clean up grease or oil spills. BATHROOM  Install night lights.  Install grab bars by the toilet and in the tub and shower.  Use non-skid mats or decals in the tub or shower.  Place a plastic non-slip stool in the shower to sit on, if needed.  Keep floors dry and clean up all water on the floor immediately.  Remove soap buildup in the tub or shower on a regular basis.  Secure bath mats with non-slip, double-sided rug tape.  Remove throw rugs and tripping hazards from the floors. BEDROOMS  Install night lights.  Make sure a bedside light is easy to reach.  Do not use oversized bedding.  Keep a telephone by your bedside.  Have a firm chair with side arms to use for getting dressed.  Remove throw rugs and tripping hazards from the floor. KITCHEN  Keep handles on pots and pans turned toward the center of the stove. Use back burners when possible.  Clean up spills quickly and allow time for drying.  Avoid walking on wet  floors.  Avoid hot utensils and knives.  Position shelves so they are not too high or low.  Place commonly used objects within easy reach.  If necessary, use a sturdy step stool with a grab bar when reaching.  Keep electrical cables out of the way.  Do not use floor polish or wax that makes floors slippery. If you must use wax, use non-skid floor wax.  Remove throw rugs and tripping hazards from the floor. STAIRWAYS  Never leave objects on stairs.  Place handrails on both sides of stairways and use them. Fix any loose handrails. Make sure handrails on both sides of the stairways are as long as the stairs.  Check carpeting to make sure it is firmly attached along stairs. Make repairs to worn or loose carpet promptly.  Avoid placing throw rugs at the top or bottom of stairways, or properly secure the rug with carpet tape to prevent slippage. Get rid of throw rugs, if possible.  Have an electrician put in a light switch at the top and bottom of the stairs. OTHER FALL PREVENTION TIPS  Wear low-heel or rubber-soled shoes that are supportive and fit well. Wear closed toe shoes.  When using a stepladder, make sure it is fully opened and both spreaders are firmly locked. Do not climb a closed stepladder.  Add color or contrast paint or tape to grab bars and handrails in your home. Place contrasting color  strips on first and last steps.  Learn and use mobility aids as needed. Install an electrical emergency response system.  Turn on lights to avoid dark areas. Replace light bulbs that burn out immediately. Get light switches that glow.  Arrange furniture to create clear pathways. Keep furniture in the same place.  Firmly attach carpet with non-skid or double-sided tape.  Eliminate uneven floor surfaces.  Select a carpet pattern that does not visually hide the edge of steps.  Be aware of all pets. OTHER HOME SAFETY TIPS  Set the water temperature for 120 F (48.8 C).  Keep  emergency numbers on or near the telephone.  Keep smoke detectors on every level of the home and near sleeping areas. Document Released: 06/21/2002 Document Revised: 12/31/2011 Document Reviewed: 09/20/2011 [facility] Patient Information 2015 [place], [place]. This information is not intended to replace advice given to you by your health care provider. Make sure you discuss any questions you have with your health care provider.   Preventive Care for Adults Ages 26 and over  Blood pressure check.** / Every 1 to 2 years.  Lipid and cholesterol check.**/ Every 5 years beginning at age 1.  Lung cancer screening. / Every year if you are aged 62-80 years and have a 30-pack-year history of smoking and currently smoke or have quit within the past 15 years. Yearly screening is stopped once you have quit smoking for at least 15 years or develop a health problem that would prevent you from having lung cancer treatment.  Fecal occult blood test (FOBT) of stool. / Every year beginning at age 64 and continuing until age 110. You may not have to do this test if you get a colonoscopy every 10 years.  Flexible sigmoidoscopy** or colonoscopy.** / Every 5 years for a flexible sigmoidoscopy or every 10 years for a colonoscopy beginning at age 6 and continuing until age 59.  Hepatitis C blood test.** / For all people born from [birth year] through 1966 and any individual with known risks for hepatitis C.  Abdominal aortic aneurysm (AAA) screening.** / A one-time screening for ages 67 to 31 years who are current or former smokers.  Skin self-exam. / Monthly.  Influenza vaccine. / Every year.  Tetanus, diphtheria, and acellular pertussis (Tdap/Td) vaccine.** / 1 dose of Td every 10 years.  Varicella vaccine.** / Consult your health care provider.  Zoster vaccine.** / 1 dose for adults aged 40 years or older.  Pneumococcal 13-valent conjugate (PCV13) vaccine.** / Consult your health care provider.  Pneumococcal  polysaccharide (PPSV23) vaccine.** / 1 dose for all adults aged 81 years and older.  Meningococcal vaccine.** / Consult your health care provider.  Hepatitis A vaccine.** / Consult your health care provider.  Hepatitis B vaccine.** / Consult your health care provider.  Haemophilus influenzae type b (Hib) vaccine.** / Consult your health care provider. **Family history and personal history of risk and conditions may change your health care provider's recommendations. Document Released: 08/27/2001 Document Revised: 07/06/2013 Document Reviewed: 11/26/2010 [facility] Patient Information 2015 [place], [place]. This information is not intended to replace advice given to you by your health care provider. Make sure you discuss any questions you have with your health care provider.

## 2016-03-06 NOTE — Progress Notes (Signed)
Pre visit review using our clinic review tool, if applicable. No additional management support is needed unless otherwise documented below in the visit note. 
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
## 2016-03-11 ENCOUNTER — Other Ambulatory Visit: Payer: Self-pay | Admitting: Internal Medicine

## 2016-03-28 DIAGNOSIS — L820 Inflamed seborrheic keratosis: Secondary | ICD-10-CM | POA: Diagnosis not present

## 2016-03-28 DIAGNOSIS — D235 Other benign neoplasm of skin of trunk: Secondary | ICD-10-CM | POA: Diagnosis not present

## 2016-04-03 ENCOUNTER — Encounter: Payer: Self-pay | Admitting: Cardiovascular Disease

## 2016-04-15 ENCOUNTER — Ambulatory Visit: Payer: Self-pay

## 2016-04-15 ENCOUNTER — Ambulatory Visit (INDEPENDENT_AMBULATORY_CARE_PROVIDER_SITE_OTHER): Payer: Medicare Other

## 2016-04-15 DIAGNOSIS — Z23 Encounter for immunization: Secondary | ICD-10-CM | POA: Diagnosis not present

## 2016-04-17 ENCOUNTER — Encounter: Payer: Self-pay | Admitting: Cardiovascular Disease

## 2016-04-17 ENCOUNTER — Ambulatory Visit (INDEPENDENT_AMBULATORY_CARE_PROVIDER_SITE_OTHER): Payer: Medicare Other | Admitting: Cardiovascular Disease

## 2016-04-17 VITALS — BP 120/80 | HR 50 | Ht 71.0 in | Wt 244.2 lb

## 2016-04-17 DIAGNOSIS — I480 Paroxysmal atrial fibrillation: Secondary | ICD-10-CM

## 2016-04-17 MED ORDER — FLECAINIDE ACETATE 100 MG PO TABS: 300.0000 mg | ORAL_TABLET | ORAL | 0 refills | Status: DC | PRN

## 2016-04-17 NOTE — Patient Instructions (Signed)
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
## 2016-04-18 ENCOUNTER — Other Ambulatory Visit: Payer: Self-pay | Admitting: Internal Medicine

## 2016-04-28 ENCOUNTER — Other Ambulatory Visit: Payer: Self-pay | Admitting: Internal Medicine

## 2016-05-06 ENCOUNTER — Other Ambulatory Visit: Payer: Self-pay | Admitting: Internal Medicine

## 2016-07-10 ENCOUNTER — Telehealth: Payer: Self-pay

## 2016-07-10 DIAGNOSIS — Z0279 Encounter for issue of other medical certificate: Secondary | ICD-10-CM

## 2016-07-10 NOTE — Telephone Encounter (Signed)
Pt's wife, Pamala Hurry, dropped off handicap form for herself. She was informed that per PCP not medically necessary for her to have one. She is now requesting form for Pt. Pt does have limited gait and walks w/ cane. Printed handicap placard form from [facility] [employer]. Form forwarded to PCP for completion.

## 2016-07-10 NOTE — Telephone Encounter (Signed)
Please inform Pt and/or his wife Rodney Sawyer that handicap placard form has been completed and placed at front desk for pick up at their convenience. Copy of form sent for scanning.

## 2016-07-10 NOTE — Telephone Encounter (Signed)
done

## 2016-07-11 ENCOUNTER — Telehealth: Payer: Self-pay | Admitting: Internal Medicine

## 2016-07-11 MED ORDER — DILTIAZEM HCL ER 180 MG PO CP24: 180.0000 mg | ORAL_CAPSULE | Freq: Two times a day (BID) | ORAL | 2 refills | Status: DC

## 2016-07-11 NOTE — Telephone Encounter (Signed)
Wife Pamala Hurry) informed, will pick up on Tuesday 07/16/2016

## 2016-07-11 NOTE — Telephone Encounter (Signed)
Medication hx reviewed. Pt is supposed to be taking Diltiazem twice daily. Will send 90 day supply.

## 2016-07-11 NOTE — Telephone Encounter (Signed)
Patient called with questions regarding diltiazem (DILT-XR) 180 MG 24 hr capsule medication. He states that he has been taking it by mouth x2 daily instead of once daily. Because he has been taking it more than prescribed he needs a refill and would like it to be a 90 day supply. Please advise   Phone: [phone]  Pharmacy: [place], [place]

## 2016-09-06 ENCOUNTER — Ambulatory Visit: Payer: Self-pay | Admitting: Internal Medicine

## 2016-09-11 ENCOUNTER — Ambulatory Visit (INDEPENDENT_AMBULATORY_CARE_PROVIDER_SITE_OTHER): Payer: Medicare Other | Admitting: Internal Medicine

## 2016-09-11 ENCOUNTER — Encounter: Payer: Self-pay | Admitting: Internal Medicine

## 2016-09-11 VITALS — BP 132/78 | HR 54 | Temp 97.7°F | Resp 14 | Ht 71.0 in | Wt 246.0 lb

## 2016-09-11 DIAGNOSIS — I10 Essential (primary) hypertension: Secondary | ICD-10-CM

## 2016-09-11 DIAGNOSIS — E785 Hyperlipidemia, unspecified: Secondary | ICD-10-CM

## 2016-09-11 DIAGNOSIS — R7303 Prediabetes: Secondary | ICD-10-CM | POA: Diagnosis not present
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
## 2016-09-11 NOTE — Progress Notes (Signed)
Pre visit review using our clinic review tool, if applicable. No additional management support is needed unless otherwise documented below in the visit note. 

## 2016-09-11 NOTE — Patient Instructions (Addendum)
Come in the morning for labs  Next visit for a physical in 6 months

## 2016-09-12 ENCOUNTER — Other Ambulatory Visit (INDEPENDENT_AMBULATORY_CARE_PROVIDER_SITE_OTHER): Payer: Medicare Other

## 2016-09-12 DIAGNOSIS — R7303 Prediabetes: Secondary | ICD-10-CM | POA: Diagnosis not present

## 2016-09-12 DIAGNOSIS — I10 Essential (primary) hypertension: Secondary | ICD-10-CM

## 2016-09-12 DIAGNOSIS — E785 Hyperlipidemia, unspecified: Secondary | ICD-10-CM

## 2016-09-12 LAB — BASIC METABOLIC PANEL
BUN: 14 mg/dL (ref 6–23)
CO2: 30 mEq/L (ref 19–32)
Calcium: 9 mg/dL (ref 8.4–10.5)
Chloride: 102 mEq/L (ref 96–112)
Creatinine, Ser: 1.08 mg/dL (ref 0.40–1.50)
GFR: 71.49 mL/min (ref >60.00)
Glucose, Bld: 104 mg/dL — ABNORMAL HIGH (ref 70–99)
Potassium: 4.3 mEq/L (ref 3.5–5.1)
Sodium: 138 mEq/L (ref 135–145)

## 2016-09-12 LAB — LIPID PANEL
Cholesterol: 139 mg/dL (ref 0–200)
HDL: 37.4 mg/dL — ABNORMAL LOW (ref >39.00)
LDL Cholesterol: 77 mg/dL (ref 0–99)
NonHDL: 101.1
Total CHOL/HDL Ratio: 4
Triglycerides: 121 mg/dL (ref 0.0–149.0)
VLDL: 24.2 mg/dL (ref 0.0–40.0)

## 2016-09-12 LAB — HEMOGLOBIN A1C: Hgb A1c MFr Bld: 5.9 % (ref 4.6–6.5)

## 2016-09-12 NOTE — Assessment & Plan Note (Signed)
Prediabetes: Diet control, check A1c HTN: Continue Lopressor, diltiazem, Lotensin. Check a BMP Hyperlipidemia: On Crestor, check a FLP. Paroxysmal atrial flutter: Continue same meds, heart rate in the 50s, no sxs, no change. Occasional constipation x 6  months, not a major problem for the patient, relief by OTCs. Unlikely to be related to CCBs, has been on diltiazem for long time. rx observation RTC 6 months, CPX

## 2016-10-07 ENCOUNTER — Other Ambulatory Visit: Payer: Self-pay | Admitting: Internal Medicine

## 2016-10-07 NOTE — Telephone Encounter (Signed)
UTD reviewed: aceptable to keep citalopram 20 mg while he takes omeprazole. He takes flecainide as needed only. Okay to refill meds.

## 2016-10-07 NOTE — Telephone Encounter (Signed)
Tried refilling Pt's meds; BEER's list throwing warning on Omeprazole/Citalopram and Citalopram/Flecainide. Please advise.

## 2016-10-07 NOTE — Telephone Encounter (Signed)
Rx sent 

## 2016-11-21 DIAGNOSIS — H52223 Regular astigmatism, bilateral: Secondary | ICD-10-CM | POA: Diagnosis not present

## 2016-12-24 ENCOUNTER — Other Ambulatory Visit: Payer: Self-pay | Admitting: Internal Medicine

## 2017-02-04 ENCOUNTER — Other Ambulatory Visit: Payer: Self-pay | Admitting: Internal Medicine

## 2017-02-07 ENCOUNTER — Other Ambulatory Visit: Payer: Self-pay

## 2017-02-07 MED ORDER — BENAZEPRIL HCL 20 MG PO TABS: 20.0000 mg | ORAL_TABLET | Freq: Two times a day (BID) | ORAL | 2 refills | Status: DC

## 2017-02-13 DIAGNOSIS — L820 Inflamed seborrheic keratosis: Secondary | ICD-10-CM | POA: Diagnosis not present

## 2017-02-13 DIAGNOSIS — Z85828 Personal history of other malignant neoplasm of skin: Secondary | ICD-10-CM | POA: Diagnosis not present

## 2017-02-13 DIAGNOSIS — Z08 Encounter for follow-up examination after completed treatment for malignant neoplasm: Secondary | ICD-10-CM | POA: Diagnosis not present

## 2017-03-24 ENCOUNTER — Other Ambulatory Visit: Payer: Self-pay

## 2017-03-24 MED ORDER — DILTIAZEM HCL ER 180 MG PO CP24: 180.0000 mg | ORAL_CAPSULE | Freq: Two times a day (BID) | ORAL | 0 refills | Status: DC

## 2017-04-15 ENCOUNTER — Ambulatory Visit (INDEPENDENT_AMBULATORY_CARE_PROVIDER_SITE_OTHER): Payer: Medicare Other

## 2017-04-15 DIAGNOSIS — Z23 Encounter for immunization: Secondary | ICD-10-CM

## 2017-04-28 ENCOUNTER — Encounter: Payer: Self-pay | Admitting: Internal Medicine

## 2017-04-28 ENCOUNTER — Ambulatory Visit (INDEPENDENT_AMBULATORY_CARE_PROVIDER_SITE_OTHER): Payer: Medicare Other | Admitting: Internal Medicine

## 2017-04-28 VITALS — BP 124/68 | HR 59 | Temp 97.8°F | Resp 14 | Ht 71.0 in | Wt 251.5 lb

## 2017-04-28 DIAGNOSIS — Z0000 Encounter for general adult medical examination without abnormal findings: Secondary | ICD-10-CM

## 2017-04-28 DIAGNOSIS — Z1211 Encounter for screening for malignant neoplasm of colon: Secondary | ICD-10-CM

## 2017-04-28 NOTE — Progress Notes (Signed)
Pre visit review using our clinic review tool, if applicable. No additional management support is needed unless otherwise documented below in the visit note. 

## 2017-04-28 NOTE — Assessment & Plan Note (Signed)
Chronic medical problems: All seem well controlled, last cholesterol satisfactory. Continue with Lotensin, diltiazem, Lopressor, Crestor and pill on pocket flecainide. Will get a CMP, CBC, A1c  Depression: on chronic citalopram, he feels no difference in his mood, no s/e  but seems that his wife likes his behavior much better when he is on SSRIs , see HPI, currently not taking it but he has no problem going back . Does not need a refill now. See instructions. RTC 6 months
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
## 2017-04-28 NOTE — Patient Instructions (Signed)
GO TO THE LAB : Get the blood work     GO TO THE FRONT DESK Schedule your next appointment for a  routine checkup in 6 months, fasting  Go back on citalopram: Half tablet the first 10 days, then 1 tablet daily. Call for a refill when needed  Consider a Medicare wellness at your convenience with one of our nurses
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
## 2017-04-29 ENCOUNTER — Encounter: Payer: Self-pay | Admitting: Gastroenterology

## 2017-04-29 LAB — COMPREHENSIVE METABOLIC PANEL
ALT: 13 U/L (ref 0–53)
AST: 14 U/L (ref 0–37)
Albumin: 4 g/dL (ref 3.5–5.2)
Alkaline Phosphatase: 47 U/L (ref 39–117)
BUN: 17 mg/dL (ref 6–23)
CO2: 25 mEq/L (ref 19–32)
Calcium: 9.3 mg/dL (ref 8.4–10.5)
Chloride: 105 mEq/L (ref 96–112)
Creatinine, Ser: 1.18 mg/dL (ref 0.40–1.50)
GFR: 64.43 mL/min (ref >60.00)
Glucose, Bld: 141 mg/dL — ABNORMAL HIGH (ref 70–99)
Potassium: 3.9 mEq/L (ref 3.5–5.1)
Sodium: 140 mEq/L (ref 135–145)
Total Bilirubin: 0.6 mg/dL (ref 0.2–1.2)
Total Protein: 6.6 g/dL (ref 6.0–8.3)

## 2017-04-29 LAB — CBC WITH DIFFERENTIAL/PLATELET
Basophils Absolute: 0.1 10*3/uL (ref 0.0–0.1)
Basophils Relative: 0.6 % (ref 0.0–3.0)
Eosinophils Absolute: 0.2 10*3/uL (ref 0.0–0.7)
Eosinophils Relative: 2.8 % (ref 0.0–5.0)
HCT: 43.4 % (ref 39.0–52.0)
Hemoglobin: 14.8 g/dL (ref 13.0–17.0)
Lymphocytes Relative: 25.6 % (ref 12.0–46.0)
Lymphs Abs: 2.1 10*3/uL (ref 0.7–4.0)
MCHC: 34.2 g/dL (ref 30.0–36.0)
MCV: 93 fl (ref 78.0–100.0)
Monocytes Absolute: 0.7 10*3/uL (ref 0.1–1.0)
Monocytes Relative: 8.9 % (ref 3.0–12.0)
Neutro Abs: 5 10*3/uL (ref 1.4–7.7)
Neutrophils Relative %: 62.1 % (ref 43.0–77.0)
Platelets: 240 10*3/uL (ref 150.0–400.0)
RBC: 4.67 Mil/uL (ref 4.22–5.81)
RDW: 13.2 % (ref 11.5–15.5)
WBC: 8.1 10*3/uL (ref 4.0–10.5)

## 2017-04-29 LAB — PSA: PSA: 0.91 ng/mL (ref 0.10–4.00)

## 2017-04-29 LAB — HEMOGLOBIN A1C: Hgb A1c MFr Bld: 5.8 % (ref 4.6–6.5)
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
## 2017-05-07 ENCOUNTER — Encounter: Payer: Self-pay | Admitting: Cardiovascular Disease

## 2017-05-07 ENCOUNTER — Ambulatory Visit: Payer: Medicare Other | Admitting: Cardiovascular Disease

## 2017-05-07 VITALS — BP 132/78 | HR 56 | Ht 71.0 in | Wt 250.0 lb

## 2017-05-07 DIAGNOSIS — I10 Essential (primary) hypertension: Secondary | ICD-10-CM | POA: Diagnosis not present

## 2017-05-07 MED ORDER — ASPIRIN EC 81 MG PO TBEC: 81.0000 mg | DELAYED_RELEASE_TABLET | Freq: Every day | ORAL | 3 refills | Status: DC

## 2017-05-07 NOTE — Patient Instructions (Signed)

## 2017-05-19 ENCOUNTER — Ambulatory Visit (AMBULATORY_SURGERY_CENTER): Payer: Self-pay | Admitting: *Deleted

## 2017-05-19 VITALS — Ht 71.0 in | Wt 249.6 lb

## 2017-05-19 DIAGNOSIS — Z86010 Personal history of colonic polyps: Secondary | ICD-10-CM

## 2017-05-19 MED ORDER — NA SULFATE-K SULFATE-MG SULF 17.5-3.13-1.6 GM/177ML PO SOLN: 1.0000 [IU] | Freq: Once | ORAL | 0 refills | Status: AC

## 2017-05-19 NOTE — Progress Notes (Signed)
No egg or soy allergy known to patient  No issues with past sedation with any surgeries  or procedures, no intubation problems  No diet pills per patient No home 02 use per patient  No blood thinners per patient  Pt denies issues with constipation taking fiber daily and probiotic No A fib or A flutter  In the past EMMI video sent to pt's e mail  Pt. declined

## 2017-05-20 ENCOUNTER — Encounter: Payer: Self-pay | Admitting: Gastroenterology

## 2017-05-20 ENCOUNTER — Telehealth: Payer: Self-pay | Admitting: Gastroenterology

## 2017-05-20 NOTE — Telephone Encounter (Signed)
Pt states his prep is 65$ and he cannot afford that and wishes to be changed to something different. Instructed we can give him a sample suprep and he needs to pick this up 4th floor desk before 5 pm any day this week. Pt verbalized understanding   Medication Samples have been provided to the patient.  Drug name: suprep            Qty: 1  LOT: [id number]  Exp.Date: 9-20  Dosing instructions: as directed   The patient has been instructed regarding the correct time, dose, and frequency of taking this medication, including desired effects and most common side effects.   Hetty Ely Widener 3:54 PM 05/20/2017

## 2017-05-22 ENCOUNTER — Encounter: Payer: Self-pay | Admitting: Gastroenterology

## 2017-06-02 ENCOUNTER — Other Ambulatory Visit: Payer: Self-pay

## 2017-06-02 ENCOUNTER — Ambulatory Visit (AMBULATORY_SURGERY_CENTER): Payer: Medicare Other | Admitting: Gastroenterology

## 2017-06-02 ENCOUNTER — Encounter: Payer: Self-pay | Admitting: Gastroenterology

## 2017-06-02 VITALS — BP 104/61 | HR 50 | Temp 96.6°F | Resp 10 | Ht 71.0 in | Wt 249.0 lb

## 2017-06-02 DIAGNOSIS — D123 Benign neoplasm of transverse colon: Secondary | ICD-10-CM

## 2017-06-02 DIAGNOSIS — Z1211 Encounter for screening for malignant neoplasm of colon: Secondary | ICD-10-CM | POA: Diagnosis not present

## 2017-06-02 DIAGNOSIS — Z86010 Personal history of colonic polyps: Secondary | ICD-10-CM

## 2017-06-02 DIAGNOSIS — K635 Polyp of colon: Secondary | ICD-10-CM

## 2017-06-02 MED ORDER — SODIUM CHLORIDE 0.9 % IV SOLN: 500.0000 mL | INTRAVENOUS | Status: DC

## 2017-06-02 NOTE — Progress Notes (Signed)
Pt. Reports No change in his medical or surgical history since his pre-visit 05/19/2017

## 2017-06-02 NOTE — Progress Notes (Signed)
Called to room to assist during endoscopic procedure.  Patient ID and intended procedure confirmed with present staff. Received instructions for my participation in the procedure from the performing physician.  

## 2017-06-02 NOTE — Patient Instructions (Signed)
Handout given on polyps  YOU HAD AN ENDOSCOPIC PROCEDURE TODAY AT THE  ENDOSCOPY CENTER:   Refer to the procedure report that was given to you for any specific questions about what was found during the examination.  If the procedure report does not answer your questions, please call your gastroenterologist to clarify.  If you requested that your care partner not be given the details of your procedure findings, then the procedure report has been included in a sealed envelope for you to review at your convenience later.  YOU SHOULD EXPECT: Some feelings of bloating in the abdomen. Passage of more gas than usual.  Walking can help get rid of the air that was put into your GI tract during the procedure and reduce the bloating. If you had a lower endoscopy (such as a colonoscopy or flexible sigmoidoscopy) you may notice spotting of blood in your stool or on the toilet paper. If you underwent a bowel prep for your procedure, you may not have a normal bowel movement for a few days.  Please Note:  You might notice some irritation and congestion in your nose or some drainage.  This is from the oxygen used during your procedure.  There is no need for concern and it should clear up in a day or so.  SYMPTOMS TO REPORT IMMEDIATELY:   Following lower endoscopy (colonoscopy or flexible sigmoidoscopy):  Excessive amounts of blood in the stool  Significant tenderness or worsening of abdominal pains  Swelling of the abdomen that is new, acute  Fever of 100F or higher    For urgent or emergent issues, a gastroenterologist can be reached at any hour by calling [phone].   DIET:  We do recommend a small meal at first, but then you may proceed to your regular diet.  Drink plenty of fluids but you should avoid alcoholic beverages for 24 hours.  ACTIVITY:  You should plan to take it easy for the rest of today and you should NOT DRIVE or use heavy machinery until tomorrow (because of the sedation  medicines used during the test).    FOLLOW UP: Our staff will call the number listed on your records the next business day following your procedure to check on you and address any questions or concerns that you may have regarding the information given to you following your procedure. If we do not reach you, we will leave a message.  However, if you are feeling well and you are not experiencing any problems, there is no need to return our call.  We will assume that you have returned to your regular daily activities without incident.  If any biopsies were taken you will be contacted by phone or by letter within the next 1-3 weeks.  Please call us at [phone] if you have not heard about the biopsies in 3 weeks.    SIGNATURES/CONFIDENTIALITY: You and/or your care partner have signed paperwork which will be entered into your electronic medical record.  These signatures attest to the fact that that the information above on your After Visit Summary has been reviewed and is understood.  Full responsibility of the confidentiality of this discharge information lies with you and/or your care-partner. 

## 2017-06-02 NOTE — Progress Notes (Signed)
To PACU, VSS. Report to [email or web address] 

## 2017-06-02 NOTE — Op Note (Signed)
[place] Patient Name: Rodney Sawyer Procedure Date: 06/02/2017 1:21 PM MRN: [id number] Endoscopist: Mallie Mussel L. Loletha Carrow , MD Age: 71 Referring MD:  Date of Birth: July 24, 1945 Gender: Male Account #: [id number] Procedure:                Colonoscopy Indications:              Surveillance: Personal history of adenomatous                            polyps on last colonoscopy 3 years ago (3 TA's,                            each < or = 78mm) Medicines:                Monitored Anesthesia Care Procedure:                Pre-Anesthesia Assessment:                           - Prior to the procedure, a History and Physical                            was performed, and patient medications and                            allergies were reviewed. The patient's tolerance of                            previous anesthesia was also reviewed. The risks                            and benefits of the procedure and the sedation                            options and risks were discussed with the patient.                            All questions were answered, and informed consent                            was obtained. Anticoagulants: The patient has taken                            aspirin. It was decided not to withhold this                            medication prior to the procedure. ASA Grade                            Assessment: III - A patient with severe systemic                            disease. After reviewing the risks and benefits,  the patient was deemed in satisfactory condition to                            undergo the procedure.                           After obtaining informed consent, the colonoscope                            was passed under direct vision. Throughout the                            procedure, the patient's blood pressure, pulse, and                            oxygen saturations were monitored continuously. The      Model CF-[id number]) scope was introduced                            through the anus and advanced to the the cecum,                            identified by appendiceal orifice and ileocecal                            valve. The colonoscopy was performed without                            difficulty. The patient tolerated the procedure                            well. The quality of the bowel preparation was                            good. The ileocecal valve, appendiceal orifice, and                            rectum were photographed. The quality of the bowel                            preparation was evaluated using the BBPS [facility]                            Bowel Preparation Scale) with scores of: Right                            Colon = 2, Transverse Colon = 2 and Left Colon = 2.                            The total BBPS score equals 6. The bowel                            preparation used was SUPREP. Scope In: 1:25:03 PM Scope Out:  1:44:36 PM Scope Withdrawal Time: 0 hours 13 minutes 49 seconds  Total Procedure Duration: 0 hours 19 minutes 33 seconds  Findings:                 The perianal and digital rectal examinations were                            normal.                           Three sessile polyps were found in the distal                            transverse colon and hepatic flexure. The polyps                            were 4 to 8 mm in size. These polyps were removed                            with a cold snare. Resection and retrieval were                            complete.                           The exam was otherwise without abnormality on                            direct and retroflexion views. Complications:            No immediate complications. Estimated Blood Loss:     Estimated blood loss was minimal. Impression:               - Three 4 to 8 mm polyps in the distal transverse                            colon and at the hepatic flexure,  removed with a                            cold snare. Resected and retrieved.                           - The examination was otherwise normal on direct                            and retroflexion views. Recommendation:           - Patient has a contact number available for                            emergencies. The signs and symptoms of potential                            delayed complications were discussed with the  patient. Return to normal activities tomorrow.                            Written discharge instructions were provided to the                            patient.                           - Resume previous diet.                           - Continue present medications.                           - Await pathology results.                           - Repeat colonoscopy is recommended for                            surveillance. The colonoscopy date will be                            determined after pathology results from today's                            exam become available for review. Orlondo Holycross L. Loletha Carrow, MD 06/02/2017 1:52:16 PM This report has been signed electronically.

## 2017-06-03 ENCOUNTER — Telehealth: Payer: Self-pay

## 2017-06-03 NOTE — Telephone Encounter (Signed)
  Follow up Call-  Call back number 06/02/2017  Post procedure Call Back phone  # [phone]  Permission to leave phone message Yes  Some recent data might be hidden     Patient questions:  Do you have a fever, pain , or abdominal swelling? No. Pain Score  0 *  Have you tolerated food without any problems? Yes.    Have you been able to return to your normal activities? Yes.    Do you have any questions about your discharge instructions: Diet   No. Medications  No. Follow up visit  No.  Do you have questions or concerns about your Care? No.  Actions: * If pain score is 4 or above: No action needed, pain <4.

## 2017-06-09 ENCOUNTER — Encounter: Payer: Self-pay | Admitting: Gastroenterology

## 2017-06-19 ENCOUNTER — Other Ambulatory Visit: Payer: Self-pay | Admitting: Internal Medicine

## 2017-09-04 ENCOUNTER — Encounter (HOSPITAL_COMMUNITY): Payer: Self-pay | Admitting: Emergency Medicine

## 2017-09-04 ENCOUNTER — Emergency Department (HOSPITAL_COMMUNITY)
Admission: EM | Admit: 2017-09-04 | Discharge: 2017-09-05 | Disposition: A | Discharge status: Home / Self Care | Payer: Medicare Other | Attending: Emergency Medicine | Admitting: Emergency Medicine

## 2017-09-04 DIAGNOSIS — Z79899 Other long term (current) drug therapy: Secondary | ICD-10-CM | POA: Diagnosis not present

## 2017-09-04 DIAGNOSIS — I4891 Unspecified atrial fibrillation: Secondary | ICD-10-CM | POA: Diagnosis not present

## 2017-09-04 DIAGNOSIS — I4892 Unspecified atrial flutter: Secondary | ICD-10-CM | POA: Diagnosis not present

## 2017-09-04 DIAGNOSIS — I10 Essential (primary) hypertension: Secondary | ICD-10-CM | POA: Diagnosis not present

## 2017-09-04 DIAGNOSIS — Z85828 Personal history of other malignant neoplasm of skin: Secondary | ICD-10-CM | POA: Insufficient documentation

## 2017-09-04 DIAGNOSIS — Z7982 Long term (current) use of aspirin: Secondary | ICD-10-CM | POA: Diagnosis not present

## 2017-09-04 LAB — BASIC METABOLIC PANEL
Anion gap: 11 (ref 5–15)
BUN: 13 mg/dL (ref 6–20)
CO2: 24 mmol/L (ref 22–32)
Calcium: 8.9 mg/dL (ref 8.9–10.3)
Chloride: 104 mmol/L (ref 101–111)
Creatinine, Ser: 1.2 mg/dL (ref 0.61–1.24)
GFR calc Af Amer: >60 mL/min (ref >60)
GFR calc non Af Amer: 59 mL/min — ABNORMAL LOW (ref >60)
Glucose, Bld: 115 mg/dL — ABNORMAL HIGH (ref 65–99)
Potassium: 4 mmol/L (ref 3.5–5.1)
Sodium: 139 mmol/L (ref 135–145)

## 2017-09-04 LAB — CBC
HCT: 46.1 % (ref 39.0–52.0)
Hemoglobin: 15.7 g/dL (ref 13.0–17.0)
MCH: 31.7 pg (ref 26.0–34.0)
MCHC: 34.1 g/dL (ref 30.0–36.0)
MCV: 93.1 fL (ref 78.0–100.0)
Platelets: 242 10*3/uL (ref 150–400)
RBC: 4.95 MIL/uL (ref 4.22–5.81)
RDW: 13.5 % (ref 11.5–15.5)
WBC: 8.5 10*3/uL (ref 4.0–10.5)

## 2017-09-04 LAB — I-STAT TROPONIN, ED: Troponin i, poc: 0 ng/mL (ref 0.00–0.08)

## 2017-09-04 MED ORDER — METOPROLOL TARTRATE 5 MG/5ML IV SOLN
5.0000 mg | Freq: Once | INTRAVENOUS | Status: AC
  Administered 2017-09-05: 5 mg via INTRAVENOUS
  Filled 2017-09-04: qty 5

## 2017-09-04 NOTE — ED Notes (Signed)
ED Provider at bedside. 

## 2017-09-04 NOTE — ED Triage Notes (Signed)
Pt reports onset of "fluttering" feeling in chest 2000. Pt states hx of afib. Denies CP, SOB. HR 120's.

## 2017-09-05 ENCOUNTER — Telehealth: Payer: Self-pay | Admitting: Cardiovascular Disease

## 2017-09-05 MED ORDER — APIXABAN 5 MG PO TABS
5.0000 mg | ORAL_TABLET | Freq: Once | ORAL | Status: DC
  Filled 2017-09-05: qty 1

## 2017-09-05 MED ORDER — DILTIAZEM HCL-DEXTROSE 100-5 MG/100ML-% IV SOLN (PREMIX)
5.0000 mg/h | INTRAVENOUS | Status: DC
  Administered 2017-09-05: 5 mg/h via INTRAVENOUS
  Filled 2017-09-05: qty 100

## 2017-09-05 MED ORDER — APIXABAN 5 MG PO TABS: 5.0000 mg | ORAL_TABLET | Freq: Two times a day (BID) | ORAL | 0 refills | Status: DC

## 2017-09-05 MED ORDER — DILTIAZEM LOAD VIA INFUSION
20.0000 mg | Freq: Once | INTRAVENOUS | Status: AC
  Administered 2017-09-05: 20 mg via INTRAVENOUS
  Filled 2017-09-05: qty 20

## 2017-09-05 NOTE — ED Provider Notes (Signed)
[facility] EMERGENCY DEPARTMENT Provider Note   CSN: [id number] Arrival date & time: 09/04/17  2235     History   Chief Complaint Chief Complaint  Patient presents with  . Atrial Fibrillation    HPI Rodney Sawyer is a 72 y.o. male.  HPI Patient is a 72 year old male with a known history of paroxysmal atrial fibrillation not on anticoagulation.  He states he developed palpitations this evening while at dinner.  He was eating [place] food.  No other recent changes in his medications.  Denies increased caffeine intake.  He presented to the emergency department in atrial fibrillation with rapid ventricular response.  Patient has a long-standing history of paroxysmal A. fib.  Is been decided between the patient and the patient's cardiologist given the rarity of these events that he does not need to be on chronic anticoagulation.  He is under the care of cardiology and has instructions for pill in the pocket flecainide.  He tried this at approximately 8:30 PM without resolution of his symptoms thus he came to the ER for further evaluation.  At this time he continues in A. fib but now rate controlled.  He states he feels better and has no more palpitations or fluttering sensation.   Past Medical History:  Diagnosis Date  . AF (paroxysmal atrial fibrillation) ([place])    dx ~ 2006  . Depression   . GERD (gastroesophageal reflux disease)    occasionally  . Hepatitis A 1960   containmented water at school when he was a child  . Hyperlipidemia   . Hypertension   . Seborrheic keratosis 12/2015   R midline upper back  . Shingles 12-2013  . Skin cancer    Basal cell    Patient Active Problem List   Diagnosis Date Noted  . PCP NOTES >>>>>>>>>>>>>>>>>>>>>>>>>>>>>> 10/02/2015  . Annual physical exam 09/12/2014  . Anxiety and depression 01/29/2014  . Obesity (BMI 30-39.9) 01/04/2014  . Neck pain 03/12/2013  . Nonspecific abnormal electrocardiogram (ECG) (EKG) 12/15/2012    . COLONIC POLYPS, HX OF 09/12/2010  . Prediabetes 05/26/2009  . Elevated lipids 03/11/2008  . AF (paroxysmal atrial fibrillation) ([place]) 03/11/2008  . SKIN CANCER, HX OF 03/11/2008  . Essential hypertension 06/01/2007    Past Surgical History:  Procedure Laterality Date  . ANTERIOR CERVICAL DECOMP/DISCECTOMY FUSION N/A 03/13/2013   Procedure: ANTERIOR CERVICAL DECOMPRESSION/DISCECTOMY FUSION. Cervical four-five, Cervical five-six;  Surgeon: Otilio Connors, MD;  Location: [facility];  Service: Neurosurgery;  Laterality: N/A;  . COLONOSCOPY    . COLONOSCOPY W/ POLYPECTOMY  2001   negative 2006;[facility]  . CYSTOSCOPY  2004   Negative; done for microscopic hematuria, asymptomatic  . POLYPECTOMY    . TONSILLECTOMY AND ADENOIDECTOMY    . VASECTOMY         Home Medications    Prior to Admission medications   Medication Sig Start Date End Date Taking? Authorizing Provider  aspirin EC 81 MG tablet Take 1 tablet (81 mg total) by mouth daily. 05/07/17  Yes Josue Hector, MD  benazepril (LOTENSIN) 20 MG tablet Take 1 tablet (20 mg total) by mouth 2 (two) times daily. 02/07/17  Yes Paz, Alda Berthold, MD  citalopram (CELEXA) 20 MG tablet Take 1 tablet (20 mg total) by mouth daily. 06/19/17  Yes Paz, Alda Berthold, MD  diltiazem (DILT-XR) 180 MG 24 hr capsule Take 1 capsule (180 mg total) by mouth 2 (two) times daily. 06/19/17  Yes Colon Branch, MD  flecainide (TAMBOCOR) 100 MG tablet Take 3 tablets (300 mg total) by mouth as needed (Atrial Fibrillation). FOR EPISODE  OF  AFIB 04/17/16  Yes Josue Hector, MD  metoprolol tartrate (LOPRESSOR) 50 MG tablet Take 1 tablet (50 mg total) by mouth 2 (two) times daily. 02/04/17  Yes Paz, Alda Berthold, MD  Omega-3 Fatty Acids (FISH OIL) 1200 MG CAPS Take 1 capsule by mouth daily.    Yes [provider]  omeprazole (PRILOSEC) 20 MG capsule Take 1 capsule (20 mg total) by mouth daily as needed. Patient taking differently: Take 20 mg by mouth daily.  12/24/16  Yes  Paz, Alda Berthold, MD  rosuvastatin (CRESTOR) 20 MG tablet Take 20 mg by mouth 3 (three) times a week. Mondays, Wednesdays and Fridays   Yes [provider]  selenium sulfide (SELSUN) 2.5 % shampoo APPLY TOPICALLY TO AFFECTED SKIN 20 MINUTES PRIOR TO SHOWER 05/30/17  Yes [provider]  apixaban (ELIQUIS) 5 MG TABS tablet Take 1 tablet (5 mg total) by mouth 2 (two) times daily. 09/05/17   Jola Schmidt, MD    Family History Family History  Problem Relation Age of Onset  . Alzheimer's disease Father   . Alzheimer's disease Mother   . Heart attack Paternal Uncle 68       sudden death  . Stroke Paternal Grandfather 47  . Cancer Neg Hx   . Diabetes Neg Hx   . Colon cancer Neg Hx   . Colon polyps Neg Hx   . Esophageal cancer Neg Hx   . Rectal cancer Neg Hx   . Stomach cancer Neg Hx     Social History Social History   Tobacco Use  . Smoking status: Never Smoker  . Smokeless tobacco: Never Used  Substance Use Topics  . Alcohol use: Yes    Alcohol/week: 4.2 oz    Types: 7 Glasses of wine per week    Comment: socially   . Drug use: No     Allergies   Patient has no known allergies.   Review of Systems Review of Systems  All other systems reviewed and are negative.    Physical Exam Updated Vital Signs BP 123/82   Pulse 74   Temp 98.6 F (37 C) (Oral)   Resp 16   Ht 5\' 11"  (1.803 m)   Wt 113.4 kg (250 lb)   SpO2 92%   BMI 34.87 kg/m   Physical Exam  Constitutional: He is oriented to person, place, and time. He appears well-developed and well-nourished.  HENT:  Head: Normocephalic and atraumatic.  Eyes: EOM are normal.  Neck: Normal range of motion.  Cardiovascular:  Irregularly irregular.  Pulmonary/Chest: Effort normal and breath sounds normal. No respiratory distress.  Abdominal: Soft. He exhibits no distension. There is no tenderness.  Musculoskeletal: Normal range of motion.  Neurological: He is alert and oriented to person, place, and  time.  Skin: Skin is warm and dry.  Psychiatric: He has a normal mood and affect. Judgment normal.  Nursing note and vitals reviewed.    ED Treatments / Results  Labs (all labs ordered are listed, but only abnormal results are displayed) Labs Reviewed  BASIC METABOLIC PANEL - Abnormal; Notable for the following components:      Result Value   Glucose, Bld 115 (*)    GFR calc non Af Amer 59 (*)    All other components within normal limits  CBC  I-STAT TROPONIN, ED    EKG  EKG  Interpretation  Date/Time:  Thursday September 04 2017 23:35:14 EST Ventricular Rate:  93 PR Interval:    QRS Duration: 104 QT Interval:  364 QTC Calculation: 443 R Axis:   -61 Text Interpretation:  Atrial flutter Incomplete RBBB and LAFB Low voltage, precordial leads Abnormal R-wave progression, early transition Baseline wander in lead(s) II changed from prior Confirmed by Jola Schmidt [phone]) on 09/05/2017 3:20:51 AM       Radiology No results found.  Procedures Procedures (including critical care time)  Medications Ordered in ED Medications  diltiazem (CARDIZEM) 1 mg/mL load via infusion 20 mg (20 mg Intravenous Bolus from Bag 09/05/17 0222)    And  diltiazem (CARDIZEM) 100 mg in dextrose 5% 1103mL (1 mg/mL) infusion (5 mg/hr Intravenous New Bag/Given 09/05/17 0218)  apixaban (ELIQUIS) tablet 5 mg (not administered)  metoprolol tartrate (LOPRESSOR) injection 5 mg (5 mg Intravenous Given 09/05/17 0001)     Initial Impression / Assessment and Plan / ED Course  I have reviewed the triage vital signs and the nursing notes.  Pertinent labs & imaging results that were available during my care of the patient were reviewed by me and considered in my medical decision making (see chart for details).    At time my evaluation the patient is now rate controlled.  Initial dose of IV Lopressor without improvement in his flutter.  Started on Cardizem.  Remains in atrial flutter.  He is overall  well-appearing.  He is asymptomatic at this time.  My concern is if he is asymptomatic at this time and remains in atrial flutter.  It is unclear exactly when he developed atrial flutter.  He obviously was symptomatic when he was in atrial flutter with rapid ventricular response, however is unclear at this time and therefore ED cardioversion is likely unsafe as he is not on chronic anticoagulation.  I do not think the patient needs acute hospitalization.  He has a cardiology team that follows him closely.  I will place the patient on Eliquis and asked that he follow-up with his cardiology office.  He is good at checking his pulse and can tell if his pulse rate is irregular.  I have asked him to recheck it in the morning and if he remains irregular to attempt another dose of flecainide.  Final Clinical Impressions(s) / ED Diagnoses   Final diagnoses:  Atrial flutter with rapid ventricular response [facility])    ED Discharge Orders        Ordered    apixaban (ELIQUIS) 5 MG TABS tablet  2 times daily     09/05/17 0316    Amb referral to AFIB Clinic     09/05/17 0318       Jola Schmidt, MD 09/05/17 [phone]

## 2017-09-05 NOTE — Discharge Instructions (Signed)
If you continue to feel irregular in the morning please take a repeat dose of your flecainide.    Please call the cardiology office in the morning for follow-up.    Return to the emergency department for new or worsening symptoms.    Please take the blood thinners as prescribed.

## 2017-09-05 NOTE — Telephone Encounter (Signed)
New message     Patient calling the office for samples of medication:   1.  What medication and dosage are you requesting samples for?apixaban (ELIQUIS) 5 MG TABS tablet  2.  Are you currently out of this medication? Yes    Pt c/o medication issue:  1. Name of Medication: apixaban (ELIQUIS) 5 MG TABS tablet  2. How are you currently taking this medication (dosage and times per day)? As prescribed  3. Are you having a reaction (difficulty breathing--STAT)? NO  4. What is your medication issue? Too costly,Patient has Medicare part D- not a covered medication

## 2017-09-05 NOTE — Telephone Encounter (Signed)
Patient given samples to pick up at front desk. Also gave patient paper work for assistance with medication. Patient will fill out and return paper work to Geographical information systems officer.

## 2017-09-12 ENCOUNTER — Other Ambulatory Visit: Payer: Self-pay | Admitting: Cardiovascular Disease

## 2017-09-12 ENCOUNTER — Encounter: Payer: Self-pay | Admitting: Cardiovascular Disease

## 2017-09-12 ENCOUNTER — Ambulatory Visit: Payer: Medicare Other | Admitting: Cardiovascular Disease

## 2017-09-12 VITALS — BP 122/76 | HR 55 | Ht 71.0 in | Wt 249.2 lb

## 2017-09-12 DIAGNOSIS — I10 Essential (primary) hypertension: Secondary | ICD-10-CM | POA: Diagnosis not present

## 2017-09-12 DIAGNOSIS — I480 Paroxysmal atrial fibrillation: Secondary | ICD-10-CM | POA: Diagnosis not present

## 2017-09-12 MED ORDER — FLECAINIDE ACETATE 100 MG PO TABS: 300.0000 mg | ORAL_TABLET | ORAL | 3 refills | Status: DC | PRN

## 2017-09-12 NOTE — Patient Instructions (Addendum)
Medication Instructions:  Your physician has recommended you make the following change in your medication:  1-STOP Aspirin  Labwork: NONE  Testing/Procedures: NONE  Follow-Up: Your physician wants you to follow-up in: 6 months with Dr. Johnsie Cancel. You will receive a reminder letter in the mail two months in advance. If you don't receive a letter, please call our office to schedule the follow-up appointment.   If you need a refill on your cardiac medications before your next appointment, please call your pharmacy.
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
## 2017-10-08 ENCOUNTER — Other Ambulatory Visit: Payer: Self-pay | Admitting: *Deleted

## 2017-10-08 MED ORDER — APIXABAN 5 MG PO TABS: 5.0000 mg | ORAL_TABLET | Freq: Two times a day (BID) | ORAL | 11 refills | Status: DC

## 2017-10-08 NOTE — Telephone Encounter (Signed)
Pt saw Dr Johnsie Cancel on 09/12/17, Wt 113.1Kg. Age 72 yrs old. SCr 1.2. Rx will be sent for Eliquis 5mg  BID.

## 2017-10-28 ENCOUNTER — Encounter: Payer: Self-pay | Admitting: Internal Medicine

## 2017-10-28 ENCOUNTER — Ambulatory Visit: Payer: Medicare Other | Admitting: Internal Medicine

## 2017-10-28 VITALS — BP 135/81 | HR 50 | Temp 97.7°F | Resp 16 | Ht 70.5 in | Wt 250.0 lb

## 2017-10-28 DIAGNOSIS — E785 Hyperlipidemia, unspecified: Secondary | ICD-10-CM | POA: Diagnosis not present

## 2017-10-28 DIAGNOSIS — F329 Major depressive disorder, single episode, unspecified: Secondary | ICD-10-CM

## 2017-10-28 DIAGNOSIS — F32A Depression, unspecified: Secondary | ICD-10-CM

## 2017-10-28 DIAGNOSIS — I480 Paroxysmal atrial fibrillation: Secondary | ICD-10-CM | POA: Diagnosis not present

## 2017-10-28 DIAGNOSIS — F419 Anxiety disorder, unspecified: Secondary | ICD-10-CM

## 2017-10-28 LAB — LIPID PANEL
Cholesterol: 135 mg/dL (ref 0–200)
HDL: 37.5 mg/dL — ABNORMAL LOW (ref >39.00)
LDL Cholesterol: 76 mg/dL (ref 0–99)
NonHDL: 97.72
Total CHOL/HDL Ratio: 4
Triglycerides: 110 mg/dL (ref 0.0–149.0)
VLDL: 22 mg/dL (ref 0.0–40.0)

## 2017-10-28 LAB — TSH: TSH: 1.46 u[IU]/mL (ref 0.35–4.50)

## 2017-10-28 NOTE — Assessment & Plan Note (Signed)
Hyperlipidemia: On Crestor, last LFTs normal, check a FLP. Depression: Well controlled on citalopram  P- atrial fibrillation: went to the ER 08/2017 w/ a A Fib episode , started eliquis; saw cardiology on f/u, felt to be stable, on pill on pocket flecainide, beta blockers, Eliquis and Cardizem. ASA was d/c.  Check a TSH Preventive: had a Cscope 05-2018, was told next  5 years RTC 6 months, CPX
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
## 2017-10-28 NOTE — Patient Instructions (Signed)
GO TO THE LAB : Get the blood work     GO TO THE FRONT DESK Schedule your next appointment for a  Physical exam in 6 months   

## 2017-11-06 ENCOUNTER — Other Ambulatory Visit: Payer: Self-pay | Admitting: Internal Medicine

## 2017-12-19 ENCOUNTER — Other Ambulatory Visit: Payer: Self-pay | Admitting: Internal Medicine

## 2017-12-29 ENCOUNTER — Other Ambulatory Visit: Payer: Self-pay | Admitting: Internal Medicine

## 2018-01-29 DIAGNOSIS — H52223 Regular astigmatism, bilateral: Secondary | ICD-10-CM | POA: Diagnosis not present

## 2018-04-28 ENCOUNTER — Ambulatory Visit (INDEPENDENT_AMBULATORY_CARE_PROVIDER_SITE_OTHER): Payer: Medicare Other

## 2018-04-28 DIAGNOSIS — Z23 Encounter for immunization: Secondary | ICD-10-CM | POA: Diagnosis not present

## 2018-04-30 ENCOUNTER — Other Ambulatory Visit: Payer: Self-pay | Admitting: Internal Medicine
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
## 2018-05-11 ENCOUNTER — Encounter: Payer: Self-pay | Admitting: Internal Medicine

## 2018-05-11 ENCOUNTER — Ambulatory Visit (INDEPENDENT_AMBULATORY_CARE_PROVIDER_SITE_OTHER): Payer: Medicare Other | Admitting: Internal Medicine

## 2018-05-11 VITALS — BP 126/68 | HR 68 | Temp 97.8°F | Resp 16 | Ht 71.0 in | Wt 247.4 lb

## 2018-05-11 DIAGNOSIS — Z0000 Encounter for general adult medical examination without abnormal findings: Secondary | ICD-10-CM

## 2018-05-11 DIAGNOSIS — E785 Hyperlipidemia, unspecified: Secondary | ICD-10-CM | POA: Diagnosis not present

## 2018-05-11 LAB — CBC WITH DIFFERENTIAL/PLATELET
Basophils Absolute: 0 10*3/uL (ref 0.0–0.1)
Basophils Relative: 0.7 % (ref 0.0–3.0)
Eosinophils Absolute: 0.3 10*3/uL (ref 0.0–0.7)
Eosinophils Relative: 4.7 % (ref 0.0–5.0)
HCT: 43.1 % (ref 39.0–52.0)
Hemoglobin: 14.9 g/dL (ref 13.0–17.0)
Lymphocytes Relative: 28.9 % (ref 12.0–46.0)
Lymphs Abs: 1.9 10*3/uL (ref 0.7–4.0)
MCHC: 34.6 g/dL (ref 30.0–36.0)
MCV: 91.9 fl (ref 78.0–100.0)
Monocytes Absolute: 0.6 10*3/uL (ref 0.1–1.0)
Monocytes Relative: 9.2 % (ref 3.0–12.0)
Neutro Abs: 3.7 10*3/uL (ref 1.4–7.7)
Neutrophils Relative %: 56.5 % (ref 43.0–77.0)
Platelets: 228 10*3/uL (ref 150.0–400.0)
RBC: 4.7 Mil/uL (ref 4.22–5.81)
RDW: 12.9 % (ref 11.5–15.5)
WBC: 6.6 10*3/uL (ref 4.0–10.5)

## 2018-05-11 LAB — LIPID PANEL
Cholesterol: 123 mg/dL (ref 0–200)
HDL: 34.7 mg/dL — ABNORMAL LOW (ref >39.00)
LDL Cholesterol: 59 mg/dL (ref 0–99)
NonHDL: 87.93
Total CHOL/HDL Ratio: 4
Triglycerides: 147 mg/dL (ref 0.0–149.0)
VLDL: 29.4 mg/dL (ref 0.0–40.0)

## 2018-05-11 LAB — BASIC METABOLIC PANEL
BUN: 12 mg/dL (ref 6–23)
CO2: 28 mEq/L (ref 19–32)
Calcium: 9.1 mg/dL (ref 8.4–10.5)
Chloride: 103 mEq/L (ref 96–112)
Creatinine, Ser: 1.14 mg/dL (ref 0.40–1.50)
GFR: 66.85 mL/min (ref >60.00)
Glucose, Bld: 94 mg/dL (ref 70–99)
Potassium: 4.1 mEq/L (ref 3.5–5.1)
Sodium: 138 mEq/L (ref 135–145)

## 2018-05-11 LAB — HEMOGLOBIN A1C: Hgb A1c MFr Bld: 5.8 % (ref 4.6–6.5)

## 2018-05-11 LAB — ALT: ALT: 15 U/L (ref 0–53)

## 2018-05-11 LAB — AST: AST: 15 U/L (ref 0–37)

## 2018-05-11 NOTE — Assessment & Plan Note (Signed)
Prediabetes: Check A1c HTN: Lotensin, diltiazem, metoprolol.  Checking labs High cholesterol: On Crestor, checking labs Paroxysmal A. fib: Occasionally has symptomatic episodes, good response  to flecainide. RTC 9 months

## 2018-05-11 NOTE — Patient Instructions (Signed)
GO TO THE LAB : Get the blood work     GO TO THE FRONT DESK Schedule your next appointment for a  Check up in 8 -9 months   Check the  blood pressure   weekly  Be sure your blood pressure is between 110/65 and  135/85. If it is consistently higher or lower, let me know

## 2018-05-11 NOTE — Progress Notes (Signed)
Pre visit review using our clinic review tool, if applicable. No additional management support is needed unless otherwise documented below in the visit note. 
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
## 2018-05-11 NOTE — Assessment & Plan Note (Addendum)
-   Td 2010, [place] 2013, [place] 08-2014, had a zostavax; had a flu shot ; has not find shingrix@ his pharmacy yet -Prostate cancer screening: DRE again show a very small R  extraprostatic nodule, 1 mm, decreased in size compared to last year.  Had a colonoscopy 11/18, normal, normal PSA.  Doubt the finding is a pathologic process. Rec observation -CCS: colonoscopy 05-2014, 3 polyps, cscope again 05-2017, next per GI -Counseled about diet, exercise  -Labs: bmp, ast,alt,cbc,a1c, flp

## 2018-05-13 ENCOUNTER — Other Ambulatory Visit: Payer: Self-pay | Admitting: Cardiovascular Disease

## 2018-05-13 ENCOUNTER — Encounter: Payer: Self-pay | Admitting: Cardiovascular Disease

## 2018-05-13 ENCOUNTER — Ambulatory Visit: Payer: Medicare Other | Admitting: Cardiovascular Disease

## 2018-05-13 VITALS — BP 108/68 | HR 55 | Ht 71.0 in | Wt 247.5 lb

## 2018-05-13 DIAGNOSIS — I10 Essential (primary) hypertension: Secondary | ICD-10-CM | POA: Diagnosis not present

## 2018-05-13 DIAGNOSIS — I480 Paroxysmal atrial fibrillation: Secondary | ICD-10-CM | POA: Diagnosis not present

## 2018-05-13 MED ORDER — FLECAINIDE ACETATE 100 MG PO TABS: ORAL_TABLET | ORAL | 1 refills | Status: DC

## 2018-05-13 NOTE — Patient Instructions (Signed)

## 2018-06-23 ENCOUNTER — Other Ambulatory Visit: Payer: Self-pay | Admitting: Internal Medicine

## 2018-07-05 ENCOUNTER — Other Ambulatory Visit: Payer: Self-pay | Admitting: Internal Medicine

## 2018-08-03 ENCOUNTER — Other Ambulatory Visit: Payer: Self-pay | Admitting: Internal Medicine

## 2018-10-04 ENCOUNTER — Other Ambulatory Visit: Payer: Self-pay | Admitting: Cardiovascular Disease

## 2018-10-05 NOTE — Telephone Encounter (Signed)
Age 73, wt 112.3kg, Scr 1.14 on 05/11/18 Last OV 05/13/18

## 2018-10-25 ENCOUNTER — Other Ambulatory Visit: Payer: Self-pay | Admitting: Internal Medicine

## 2019-01-06 ENCOUNTER — Other Ambulatory Visit: Payer: Self-pay | Admitting: Internal Medicine

## 2019-01-12 ENCOUNTER — Other Ambulatory Visit: Payer: Self-pay

## 2019-01-13 ENCOUNTER — Ambulatory Visit (INDEPENDENT_AMBULATORY_CARE_PROVIDER_SITE_OTHER): Payer: Medicare Other | Admitting: Internal Medicine

## 2019-01-13 DIAGNOSIS — E785 Hyperlipidemia, unspecified: Secondary | ICD-10-CM

## 2019-01-13 DIAGNOSIS — I480 Paroxysmal atrial fibrillation: Secondary | ICD-10-CM | POA: Diagnosis not present

## 2019-01-13 DIAGNOSIS — R7303 Prediabetes: Secondary | ICD-10-CM | POA: Diagnosis not present
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
## 2019-01-15 NOTE — Assessment & Plan Note (Signed)
Prediabetes: Last A1c 5.8. HTN: Continue with Lotensin, diltiazem, metoprolol.  Last BMP satisfactory.  Ambulatory BPs in the 120/80. Hyperlipidemia: Continue Crestor Atrial fibrillation: Last visit with cardiology October 2019, on beta-blocker, Eliquis, Cardizem and pill-in pocket flecainide.  Had a single episode few months ago, no further symptoms. COVID-19: Precautions discussed Preventive care: Recommend early flu shot this season We will call for RTC CPX by October.

## 2019-03-16 DIAGNOSIS — L570 Actinic keratosis: Secondary | ICD-10-CM | POA: Diagnosis not present

## 2019-03-16 DIAGNOSIS — L820 Inflamed seborrheic keratosis: Secondary | ICD-10-CM | POA: Diagnosis not present

## 2019-03-16 DIAGNOSIS — Z08 Encounter for follow-up examination after completed treatment for malignant neoplasm: Secondary | ICD-10-CM | POA: Diagnosis not present

## 2019-03-16 DIAGNOSIS — D485 Neoplasm of uncertain behavior of skin: Secondary | ICD-10-CM | POA: Diagnosis not present

## 2019-03-16 DIAGNOSIS — L821 Other seborrheic keratosis: Secondary | ICD-10-CM | POA: Diagnosis not present

## 2019-03-17 ENCOUNTER — Other Ambulatory Visit: Payer: Self-pay | Admitting: Internal Medicine

## 2019-03-21 ENCOUNTER — Other Ambulatory Visit: Payer: Self-pay | Admitting: Cardiovascular Disease

## 2019-03-23 NOTE — Telephone Encounter (Signed)
Age 73, weight 112.3kg, SCr 1.14 on 05/11/18, last OV 05/13/18, afib indication. Has f/u scheduled in Nov, added note to recheck annual BMET and CBC

## 2019-04-14 ENCOUNTER — Ambulatory Visit (INDEPENDENT_AMBULATORY_CARE_PROVIDER_SITE_OTHER): Payer: Medicare Other

## 2019-04-14 ENCOUNTER — Other Ambulatory Visit: Payer: Self-pay

## 2019-04-14 DIAGNOSIS — Z23 Encounter for immunization: Secondary | ICD-10-CM | POA: Diagnosis not present

## 2019-04-14 NOTE — Progress Notes (Signed)
Flu shot

## 2019-04-22 ENCOUNTER — Other Ambulatory Visit: Payer: Self-pay | Admitting: Internal Medicine

## 2019-04-26 ENCOUNTER — Other Ambulatory Visit: Payer: Self-pay | Admitting: Internal Medicine

## 2019-04-26 DIAGNOSIS — H2513 Age-related nuclear cataract, bilateral: Secondary | ICD-10-CM | POA: Diagnosis not present

## 2019-04-26 DIAGNOSIS — H35033 Hypertensive retinopathy, bilateral: Secondary | ICD-10-CM | POA: Diagnosis not present
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
## 2019-05-31 ENCOUNTER — Encounter: Payer: Self-pay | Admitting: Internal Medicine

## 2019-05-31 ENCOUNTER — Ambulatory Visit (INDEPENDENT_AMBULATORY_CARE_PROVIDER_SITE_OTHER): Payer: Medicare Other | Admitting: Internal Medicine

## 2019-05-31 ENCOUNTER — Other Ambulatory Visit: Payer: Self-pay

## 2019-05-31 VITALS — BP 134/93 | HR 61 | Temp 96.7°F | Resp 18 | Ht 71.0 in | Wt 255.5 lb

## 2019-05-31 DIAGNOSIS — R739 Hyperglycemia, unspecified: Secondary | ICD-10-CM | POA: Diagnosis not present

## 2019-05-31 DIAGNOSIS — Z0000 Encounter for general adult medical examination without abnormal findings: Secondary | ICD-10-CM

## 2019-05-31 LAB — CBC WITH DIFFERENTIAL/PLATELET
Basophils Absolute: 0.1 10*3/uL (ref 0.0–0.1)
Basophils Relative: 0.7 % (ref 0.0–3.0)
Eosinophils Absolute: 0.3 10*3/uL (ref 0.0–0.7)
Eosinophils Relative: 4.2 % (ref 0.0–5.0)
HCT: 43 % (ref 39.0–52.0)
Hemoglobin: 14.4 g/dL (ref 13.0–17.0)
Lymphocytes Relative: 21 % (ref 12.0–46.0)
Lymphs Abs: 1.6 10*3/uL (ref 0.7–4.0)
MCHC: 33.6 g/dL (ref 30.0–36.0)
MCV: 93.3 fl (ref 78.0–100.0)
Monocytes Absolute: 0.7 10*3/uL (ref 0.1–1.0)
Monocytes Relative: 8.9 % (ref 3.0–12.0)
Neutro Abs: 4.8 10*3/uL (ref 1.4–7.7)
Neutrophils Relative %: 65.2 % (ref 43.0–77.0)
Platelets: 260 10*3/uL (ref 150.0–400.0)
RBC: 4.61 Mil/uL (ref 4.22–5.81)
RDW: 13.1 % (ref 11.5–15.5)
WBC: 7.4 10*3/uL (ref 4.0–10.5)

## 2019-05-31 LAB — LIPID PANEL
Cholesterol: 136 mg/dL (ref 0–200)
HDL: 34.2 mg/dL — ABNORMAL LOW (ref >39.00)
LDL Cholesterol: 79 mg/dL (ref 0–99)
NonHDL: 102.18
Total CHOL/HDL Ratio: 4
Triglycerides: 117 mg/dL (ref 0.0–149.0)
VLDL: 23.4 mg/dL (ref 0.0–40.0)

## 2019-05-31 LAB — HEMOGLOBIN A1C: Hgb A1c MFr Bld: 5.8 % (ref 4.6–6.5)

## 2019-05-31 LAB — COMPREHENSIVE METABOLIC PANEL
ALT: 13 U/L (ref 0–53)
AST: 12 U/L (ref 0–37)
Albumin: 4 g/dL (ref 3.5–5.2)
Alkaline Phosphatase: 44 U/L (ref 39–117)
BUN: 19 mg/dL (ref 6–23)
CO2: 27 mEq/L (ref 19–32)
Calcium: 8.8 mg/dL (ref 8.4–10.5)
Chloride: 103 mEq/L (ref 96–112)
Creatinine, Ser: 1.15 mg/dL (ref 0.40–1.50)
GFR: 62.09 mL/min (ref >60.00)
Glucose, Bld: 113 mg/dL — ABNORMAL HIGH (ref 70–99)
Potassium: 4.3 mEq/L (ref 3.5–5.1)
Sodium: 138 mEq/L (ref 135–145)
Total Bilirubin: 0.7 mg/dL (ref 0.2–1.2)
Total Protein: 6.5 g/dL (ref 6.0–8.3)

## 2019-05-31 LAB — PSA: PSA: 1.34 ng/mL (ref 0.10–4.00)

## 2019-05-31 MED ORDER — TETANUS-DIPHTH-ACELL PERTUSSIS 5-2.5-18.5 LF-MCG/0.5 IM SUSP: 0.5000 mL | Freq: Once | INTRAMUSCULAR | 0 refills | Status: AC

## 2019-05-31 NOTE — Assessment & Plan Note (Signed)
Here for CPX Prediabetes: Check A1c HTN: Ambulatory BPs 120s/80s.  Continue Lotensin, diltiazem, metoprolol. Hyperlipidemia: On Crestor, labs Depression: Citalopram, controlled A. fib: Anticoagulated, flecainide on pocket, has used it once 3 months ago otherwise asymptomatic. RTC 1 year
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
## 2019-05-31 NOTE — Assessment & Plan Note (Signed)
-   Td 2010; print RX for Tdap - PNM 2013 - Prevnar 08-2014 -  had a zostavax - shingrix: still pending, will get when available @ his pharmacy -Prostate cancer screening: DRE showing a minute extraprostatic nodule on the right, no change, check a PSA -CCS: colonoscopy 05-2014, 3 polyps, cscope again 05-2017, next per GI - Diet: Reports he is doing well.  Exercise: Limited due to MSK issue nevertheless encouraged to stay as active as he can in a safe way. -Labs: CMP, FLP, CBC, A1c, PSA

## 2019-05-31 NOTE — Patient Instructions (Addendum)
Please schedule Medicare Wellness with Glenard Haring.   GO TO THE LAB : Get the blood work     GO TO THE FRONT DESK Schedule your next appointment for a physical exam in 1 year  Continue checking your blood pressures BP GOAL is between 110/65 and  135/85. If it is consistently higher or lower, let me know

## 2019-05-31 NOTE — Progress Notes (Signed)
Pre visit review using our clinic review tool, if applicable. No additional management support is needed unless otherwise documented below in the visit note. 

## 2019-06-01 ENCOUNTER — Ambulatory Visit: Payer: Medicare Other | Admitting: Cardiovascular Disease

## 2019-06-01 ENCOUNTER — Encounter: Payer: Self-pay | Admitting: Cardiovascular Disease

## 2019-06-01 VITALS — BP 100/66 | HR 56 | Ht 71.0 in | Wt 261.0 lb

## 2019-06-01 DIAGNOSIS — I480 Paroxysmal atrial fibrillation: Secondary | ICD-10-CM | POA: Diagnosis not present

## 2019-06-01 NOTE — Patient Instructions (Signed)

## 2019-07-27 ENCOUNTER — Other Ambulatory Visit: Payer: Self-pay | Admitting: Internal Medicine

## 2019-09-03 ENCOUNTER — Encounter: Payer: Self-pay | Admitting: Family

## 2019-09-03 ENCOUNTER — Other Ambulatory Visit: Payer: Self-pay

## 2019-09-03 ENCOUNTER — Ambulatory Visit: Payer: Medicare Other | Admitting: Family

## 2019-09-03 VITALS — BP 164/95 | HR 56 | Temp 97.0°F | Resp 16 | Ht 71.0 in | Wt 256.0 lb

## 2019-09-03 DIAGNOSIS — I10 Essential (primary) hypertension: Secondary | ICD-10-CM | POA: Diagnosis not present

## 2019-09-03 MED ORDER — HYDROCHLOROTHIAZIDE 25 MG PO TABS: 25.0000 mg | ORAL_TABLET | Freq: Every day | ORAL | 3 refills | Status: DC
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
## 2019-09-03 NOTE — Patient Instructions (Signed)
Please add hctz (water pill) once daily.

## 2019-09-10 ENCOUNTER — Encounter: Payer: Self-pay | Admitting: Internal Medicine

## 2019-09-10 ENCOUNTER — Other Ambulatory Visit: Payer: Self-pay | Admitting: Internal Medicine

## 2019-09-10 ENCOUNTER — Other Ambulatory Visit: Payer: Self-pay

## 2019-09-10 ENCOUNTER — Ambulatory Visit: Payer: Medicare Other | Admitting: Internal Medicine

## 2019-09-10 VITALS — BP 112/62 | HR 56 | Temp 95.8°F | Resp 18 | Ht 71.0 in | Wt 257.5 lb

## 2019-09-10 DIAGNOSIS — I10 Essential (primary) hypertension: Secondary | ICD-10-CM

## 2019-09-10 DIAGNOSIS — H538 Other visual disturbances: Secondary | ICD-10-CM

## 2019-09-10 NOTE — Progress Notes (Signed)
Pre visit review using our clinic review tool, if applicable. No additional management support is needed unless otherwise documented below in the visit note. 
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
## 2019-09-10 NOTE — Patient Instructions (Addendum)
Please schedule Medicare Wellness with Glenard Haring.   GO TO THE LAB : Get the blood work    Continue the same medications  Continue checking your blood pressures at least once a week BP GOAL is between 110/65 and  135/85. If it is consistently higher or lower, let me know    See you in November 2021

## 2019-09-11 LAB — BASIC METABOLIC PANEL
BUN/Creatinine Ratio: 14 (calc) (ref 6–22)
BUN: 17 mg/dL (ref 7–25)
CO2: 29 mmol/L (ref 20–32)
Calcium: 9.5 mg/dL (ref 8.6–10.3)
Chloride: 99 mmol/L (ref 98–110)
Creat: 1.25 mg/dL — ABNORMAL HIGH (ref 0.70–1.18)
Glucose, Bld: 107 mg/dL — ABNORMAL HIGH (ref 65–99)
Potassium: 4 mmol/L (ref 3.5–5.3)
Sodium: 138 mmol/L (ref 135–146)

## 2019-09-12 NOTE — Assessment & Plan Note (Signed)
HTN: BP is checked once a month is always normal, noted to be elevated for the first time 09/01/2019.  No obvious triggers: Denies any increase in stress, not taking decongestants, no change in diet. Patient was assessed at this office, HCTZ added, now BPs are around 120/70 at home and he feels well. Plan: Continue Lotensin, diltiazem, Lopressor, and HCTZ.  Check a BMP.  RF with results.  His next visit is in November consider another BMP in 3 months. Blurred vision: As described above, doubt related to high blood pressure.  To see the optometrist in a couple of days.

## 2019-09-13 DIAGNOSIS — H35033 Hypertensive retinopathy, bilateral: Secondary | ICD-10-CM | POA: Diagnosis not present

## 2019-09-13 MED ORDER — HYDROCHLOROTHIAZIDE 25 MG PO TABS: 25.0000 mg | ORAL_TABLET | Freq: Every day | ORAL | 6 refills | Status: DC

## 2019-09-13 NOTE — Addendum Note (Signed)
Addended byDamita Dunnings D on: 09/13/2019 07:47 AM   Modules accepted: Orders

## 2019-09-20 ENCOUNTER — Other Ambulatory Visit: Payer: Self-pay | Admitting: Cardiovascular Disease

## 2019-09-20 NOTE — Telephone Encounter (Signed)
Eliquis 5mg  refill request received, pt is 73yrs old, weight-116.8kg, Crea-1.25 on 09/09/2019, Diagnosis-Afib, and last seen by  Dr. Johnsie Cancel on 06/01/2019. Dose is appropriate based on dosing criteria. Will send in refill to requested pharmacy.

## 2019-10-19 ENCOUNTER — Other Ambulatory Visit: Payer: Self-pay | Admitting: Internal Medicine

## 2019-12-08 ENCOUNTER — Encounter: Payer: Self-pay | Admitting: Internal Medicine

## 2019-12-08 ENCOUNTER — Ambulatory Visit: Payer: Medicare Other | Admitting: Internal Medicine

## 2019-12-08 ENCOUNTER — Other Ambulatory Visit: Payer: Self-pay

## 2019-12-08 VITALS — BP 109/73 | HR 55 | Temp 96.2°F | Resp 18 | Ht 71.0 in | Wt 242.4 lb

## 2019-12-08 DIAGNOSIS — E669 Obesity, unspecified: Secondary | ICD-10-CM

## 2019-12-08 DIAGNOSIS — I10 Essential (primary) hypertension: Secondary | ICD-10-CM

## 2019-12-08 LAB — BASIC METABOLIC PANEL
BUN: 20 mg/dL (ref 6–23)
CO2: 30 mEq/L (ref 19–32)
Calcium: 8.9 mg/dL (ref 8.4–10.5)
Chloride: 96 mEq/L (ref 96–112)
Creatinine, Ser: 1.22 mg/dL (ref 0.40–1.50)
GFR: 57.91 mL/min — ABNORMAL LOW (ref >60.00)
Glucose, Bld: 99 mg/dL (ref 70–99)
Potassium: 4.1 mEq/L (ref 3.5–5.1)
Sodium: 134 mEq/L — ABNORMAL LOW (ref 135–145)

## 2019-12-08 NOTE — Patient Instructions (Addendum)
Please schedule Medicare Wellness with Glenard Haring.   Use peroxide in both ears daily for few days. If the clogged feeling continue or you  still have some decreased hearing please call for a ENT referral   Continue checking your blood pressures BP GOAL is between 110/65 and  135/85. If it is consistently higher or lower, let me know    GO TO THE LAB : Get the blood work     [place], [place] back for a physical exam in 6 months, fasting.

## 2019-12-08 NOTE — Progress Notes (Signed)
Pre visit review using our clinic review tool, if applicable. No additional management support is needed unless otherwise documented below in the visit note. 
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
## 2019-12-09 NOTE — Assessment & Plan Note (Signed)
HTN: Since the last office visit, HCTZ was added, ambulatory BPs are great between 110, 123456 with diastolic BP in the Q000111Q. Recheck a BMP, continue Lotensin, diltiazem, hydrochlorothiazide, metoprolol. Obesity: Doing great, has cut down on "wine and chips" and he has lost 15 pounds. Praised! Cerumen impaction: He has plenty of cerumen on the left side, on the right side the canal is very narrow, I could not see much.  We agreed to use peroxide and if the clogged feeling or HOH continue he will call for ENT referral. Preventive care: Had 2 Covid shot RTC 6 months CPX

## 2019-12-23 ENCOUNTER — Telehealth: Payer: Self-pay | Admitting: Internal Medicine

## 2019-12-23 MED ORDER — HYDROCHLOROTHIAZIDE 25 MG PO TABS: 25.0000 mg | ORAL_TABLET | Freq: Every day | ORAL | 6 refills | Status: DC

## 2019-12-23 NOTE — Telephone Encounter (Signed)
Medication: hydrochlorothiazide (HYDRODIURIL) 25 MG tablet [      Has the patient contacted their pharmacy?  (If no, request that the patient contact the pharmacy for the refill.) (If yes, when and what did the pharmacy advise?)     Preferred Pharmacy (with phone number or street name): [facility] [place], [address]  [place], [place] [address]  Phone:  [phone] Fax:  [phone]      Agent: Please be advised that RX refills may take up to 3 business days. We ask that you follow-up with your pharmacy.

## 2019-12-28 ENCOUNTER — Telehealth: Payer: Self-pay | Admitting: Cardiovascular Disease

## 2019-12-28 ENCOUNTER — Other Ambulatory Visit: Payer: Self-pay

## 2019-12-28 MED ORDER — FLECAINIDE ACETATE 100 MG PO TABS: ORAL_TABLET | ORAL | 1 refills | Status: DC

## 2019-12-28 NOTE — Telephone Encounter (Signed)
*  STAT* If patient is at the pharmacy, call can be transferred to refill team.   1. Which medications need to be refilled? (please list name of each medication and dose if known) flecainide (TAMBOCOR) 100 MG tablet  2. Which pharmacy/location (including street and city if local pharmacy) is medication to be sent to? [id number] - [place], [place] - [address] AT [address]  3. Do they need a 30 day or 90 day supply? [place]

## 2020-01-05 ENCOUNTER — Other Ambulatory Visit: Payer: Self-pay

## 2020-01-05 MED ORDER — DILTIAZEM HCL ER 180 MG PO CP24: ORAL_CAPSULE | ORAL | 1 refills | Status: DC

## 2020-02-14 ENCOUNTER — Ambulatory Visit: Payer: Medicare Other | Admitting: Internal Medicine

## 2020-02-14 ENCOUNTER — Encounter: Payer: Self-pay | Admitting: Internal Medicine

## 2020-02-14 ENCOUNTER — Other Ambulatory Visit: Payer: Self-pay

## 2020-02-14 VITALS — BP 110/74 | HR 59 | Temp 97.9°F | Resp 18 | Ht 71.0 in | Wt 246.5 lb

## 2020-02-14 DIAGNOSIS — J069 Acute upper respiratory infection, unspecified: Secondary | ICD-10-CM
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
## 2020-02-14 NOTE — Progress Notes (Signed)
Pre visit review using our clinic review tool, if applicable. No additional management support is needed unless otherwise documented below in the visit note. 

## 2020-02-14 NOTE — Patient Instructions (Addendum)
Please schedule Medicare Wellness with Glenard Haring.   For a cough, take Robitussin-DM or Mucinex DM over-the-counter  If you are not back to normal in few days let me know  If you have severe symptoms call anytime

## 2020-02-15 NOTE — Assessment & Plan Note (Signed)
URI: Suspect he had a URI that is getting better.  He has been vaccinated for Covid already, nevertheless recommend to continue taking precautions. For the remaining symptoms recommend fluids, Mucinex DM and call if not better.  See AVS.

## 2020-03-01 ENCOUNTER — Telehealth: Payer: Self-pay | Admitting: Cardiovascular Disease

## 2020-03-01 MED ORDER — APIXABAN 5 MG PO TABS: ORAL_TABLET | ORAL | 1 refills | Status: DC

## 2020-03-01 NOTE — Telephone Encounter (Signed)
Refill sent in to mail order pharmacy.

## 2020-03-01 NOTE — Telephone Encounter (Signed)
Patient called requesting a new prescription be written for his ELIQUIS 5 MG TABS tablet. He states that he can get it a lot cheaper with [facility] [place], [place] but they are requesting it be written for 180 days. He states the new prescription can be faxed to [phone] to [facility]. Please advise.

## 2020-03-09 ENCOUNTER — Other Ambulatory Visit: Payer: Self-pay | Admitting: Internal Medicine

## 2020-04-13 ENCOUNTER — Other Ambulatory Visit: Payer: Self-pay

## 2020-04-13 ENCOUNTER — Ambulatory Visit (INDEPENDENT_AMBULATORY_CARE_PROVIDER_SITE_OTHER): Payer: Medicare Other

## 2020-04-13 DIAGNOSIS — Z23 Encounter for immunization: Secondary | ICD-10-CM

## 2020-05-31 ENCOUNTER — Encounter: Payer: Self-pay | Admitting: Internal Medicine

## 2020-05-31 ENCOUNTER — Ambulatory Visit (INDEPENDENT_AMBULATORY_CARE_PROVIDER_SITE_OTHER): Payer: Medicare Other | Admitting: Internal Medicine

## 2020-05-31 ENCOUNTER — Other Ambulatory Visit: Payer: Self-pay

## 2020-05-31 VITALS — BP 116/64 | HR 50 | Temp 98.0°F | Resp 18 | Ht 71.0 in | Wt 235.0 lb

## 2020-05-31 DIAGNOSIS — R296 Repeated falls: Secondary | ICD-10-CM | POA: Diagnosis not present

## 2020-05-31 DIAGNOSIS — I10 Essential (primary) hypertension: Secondary | ICD-10-CM | POA: Diagnosis not present

## 2020-05-31 DIAGNOSIS — R739 Hyperglycemia, unspecified: Secondary | ICD-10-CM

## 2020-05-31 DIAGNOSIS — Z0000 Encounter for general adult medical examination without abnormal findings: Secondary | ICD-10-CM | POA: Diagnosis not present

## 2020-05-31 DIAGNOSIS — K439 Ventral hernia without obstruction or gangrene: Secondary | ICD-10-CM | POA: Diagnosis not present

## 2020-05-31 DIAGNOSIS — E785 Hyperlipidemia, unspecified: Secondary | ICD-10-CM

## 2020-05-31 LAB — CBC WITH DIFFERENTIAL/PLATELET
Basophils Absolute: 0 10*3/uL (ref 0.0–0.1)
Basophils Relative: 0.7 % (ref 0.0–3.0)
Eosinophils Absolute: 0.3 10*3/uL (ref 0.0–0.7)
Eosinophils Relative: 4.3 % (ref 0.0–5.0)
HCT: 41.9 % (ref 39.0–52.0)
Hemoglobin: 14.3 g/dL (ref 13.0–17.0)
Lymphocytes Relative: 27 % (ref 12.0–46.0)
Lymphs Abs: 1.9 10*3/uL (ref 0.7–4.0)
MCHC: 34.1 g/dL (ref 30.0–36.0)
MCV: 90.5 fl (ref 78.0–100.0)
Monocytes Absolute: 0.7 10*3/uL (ref 0.1–1.0)
Monocytes Relative: 9.7 % (ref 3.0–12.0)
Neutro Abs: 4.2 10*3/uL (ref 1.4–7.7)
Neutrophils Relative %: 58.3 % (ref 43.0–77.0)
Platelets: 247 10*3/uL (ref 150.0–400.0)
RBC: 4.63 Mil/uL (ref 4.22–5.81)
RDW: 13.4 % (ref 11.5–15.5)
WBC: 7.2 10*3/uL (ref 4.0–10.5)

## 2020-05-31 LAB — COMPREHENSIVE METABOLIC PANEL
ALT: 10 U/L (ref 0–53)
AST: 12 U/L (ref 0–37)
Albumin: 4 g/dL (ref 3.5–5.2)
Alkaline Phosphatase: 44 U/L (ref 39–117)
BUN: 23 mg/dL (ref 6–23)
CO2: 28 mEq/L (ref 19–32)
Calcium: 9 mg/dL (ref 8.4–10.5)
Chloride: 100 mEq/L (ref 96–112)
Creatinine, Ser: 1.14 mg/dL (ref 0.40–1.50)
GFR: 62.94 mL/min (ref >60.00)
Glucose, Bld: 91 mg/dL (ref 70–99)
Potassium: 3.8 mEq/L (ref 3.5–5.1)
Sodium: 136 mEq/L (ref 135–145)
Total Bilirubin: 0.7 mg/dL (ref 0.2–1.2)
Total Protein: 6.6 g/dL (ref 6.0–8.3)

## 2020-05-31 LAB — LIPID PANEL
Cholesterol: 130 mg/dL (ref 0–200)
HDL: 37.1 mg/dL — ABNORMAL LOW (ref >39.00)
LDL Cholesterol: 71 mg/dL (ref 0–99)
NonHDL: 92.79
Total CHOL/HDL Ratio: 4
Triglycerides: 107 mg/dL (ref 0.0–149.0)
VLDL: 21.4 mg/dL (ref 0.0–40.0)

## 2020-05-31 LAB — TSH: TSH: 0.98 u[IU]/mL (ref 0.35–4.50)

## 2020-05-31 LAB — HEMOGLOBIN A1C: Hgb A1c MFr Bld: 5.7 % (ref 4.6–6.5)

## 2020-05-31 NOTE — Progress Notes (Signed)
Pre visit review using our clinic review tool, if applicable. No additional management support is needed unless otherwise documented below in the visit note. 

## 2020-05-31 NOTE — Patient Instructions (Addendum)
If you are unable to push back in the hernia and you have nausea, severe pain: Go to the ER  GO TO THE LAB : Get the blood work     [place], [place] back for a checkup in 6 months    Fall Prevention in the Home, Adult Falls can cause injuries and can affect people from all age groups. There are many simple things that you can do to make your home safe and to help prevent falls. Ask for help when making these changes, if needed. What actions can I take to prevent falls? General instructions  Use good lighting in all rooms. Replace any light bulbs that burn out.  Turn on lights if it is dark. Use night-lights.  Place frequently used items in easy-to-reach places. Lower the shelves around your home if necessary.  Set up furniture so that there are clear paths around it. Avoid moving your furniture around.  Remove throw rugs and other tripping hazards from the floor.  Avoid walking on wet floors.  Fix any uneven floor surfaces.  Add color or contrast paint or tape to grab bars and handrails in your home. Place contrasting color strips on the first and last steps of stairways.  When you use a stepladder, make sure that it is completely opened and that the sides are firmly locked. Have someone hold the ladder while you are using it. Do not climb a closed stepladder.  Be aware of any and all pets. What can I do in the bathroom?      Keep the floor dry. Immediately clean up any water that spills onto the floor.  Remove soap buildup in the tub or shower on a regular basis.  Use non-skid mats or decals on the floor of the tub or shower.  Attach bath mats securely with double-sided, non-slip rug tape.  If you need to sit down while you are in the shower, use a plastic, non-slip stool.  Install grab bars by the toilet and in the tub and shower. Do not use towel bars as grab bars. What can I do in the bedroom?  Make sure that a  bedside light is easy to reach.  Do not use oversized bedding that drapes onto the floor.  Have a firm chair that has side arms to use for getting dressed. What can I do in the kitchen?  Clean up any spills right away.  If you need to reach for something above you, use a sturdy step stool that has a grab bar.  Keep electrical cables out of the way.  Do not use floor polish or wax that makes floors slippery. If you must use wax, make sure that it is non-skid floor wax. What can I do in the stairways?  Do not leave any items on the stairs.  Make sure that you have a light switch at the top of the stairs and the bottom of the stairs. Have them installed if you do not have them.  Make sure that there are handrails on both sides of the stairs. Fix handrails that are broken or loose. Make sure that handrails are as long as the stairways.  Install non-slip stair treads on all stairs in your home.  Avoid having throw rugs at the top or bottom of stairways, or secure the rugs with carpet tape to prevent them from moving.  Choose a carpet design that does not hide the edge of steps on  the stairway.  Check any carpeting to make sure that it is firmly attached to the stairs. Fix any carpet that is loose or worn. What can I do on the outside of my home?  Use bright outdoor lighting.  Regularly repair the edges of walkways and driveways and fix any cracks.  Remove high doorway thresholds.  Trim any shrubbery on the main path into your home.  Regularly check that handrails are securely fastened and in good repair. Both sides of any steps should have handrails.  Install guardrails along the edges of any raised decks or porches.  Clear walkways of debris and clutter, including tools and rocks.  Have leaves, snow, and ice cleared regularly.  Use sand or salt on walkways during winter months.  In the garage, clean up any spills right away, including grease or oil spills. What other  actions can I take?  Wear closed-toe shoes that fit well and support your feet. Wear shoes that have rubber soles or low heels.  Use mobility aids as needed, such as canes, walkers, scooters, and crutches.  Review your medicines with your health care provider. Some medicines can cause dizziness or changes in blood pressure, which increase your risk of falling. Talk with your health care provider about other ways that you can decrease your risk of falls. This may include working with a physical therapist or trainer to improve your strength, balance, and endurance. Where to find more information  Centers for Disease Control and Prevention, STEADI: WebmailGuide.co.za  [employer] on Aging: BrainJudge.co.uk Contact a health care provider if:  You are afraid of falling at home.  You feel weak, drowsy, or dizzy at home.  You fall at home. Summary  There are many simple things that you can do to make your home safe and to help prevent falls.  Ways to make your home safe include removing tripping hazards and installing grab bars in the bathroom.  Ask for help when making these changes in your home. This information is not intended to replace advice given to you by your health care provider. Make sure you discuss any questions you have with your health care provider. Document Revised: 06/13/2017 Document Reviewed: 02/13/2017 Elsevier Patient Education  2020 [employer].
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
## 2020-06-01 ENCOUNTER — Encounter: Payer: Self-pay | Admitting: Internal Medicine

## 2020-06-01 NOTE — Assessment & Plan Note (Signed)
-   Td 2020  - PNM 2013; - Prevnar 08-2014 -  had a zostavax - shingrix: pending for now - had covid vax x 3 - s/p flu shot -Prostate cancer screening: last DRE 2020, showed a minute extraprostatic nodule on the right,  PSA was  wnl -CCS: colonoscopy 05-2014, 3 polyps, cscope again 05-2017, next 5 years per GI. - Diet: Discussed - labs : CMP, FLP, CBC, A1c, TSH

## 2020-06-01 NOTE — Assessment & Plan Note (Signed)
Here for CPX Diabetes: Diet controlled, labs.    HTN: BP today is very good, reports normal ambulatory BPs, continue Lotensin, diltiazem, HCTZ, metoprolol.  Labs.  High cholesterol: On Crestor, labs. Depression: Controlled on citalopram Paroxysmal A. fib, no episodes that he can tell since he stopped a daily glass of wine few months ago. To see cardiology soon. 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  Offered PT referral: Declined.  Rec to use walker rather than a cane: Declined.  I asked him to let me know if he changes his mind. Spigelian hernia: Reports that hernia at the abdominal wall, location suggest a spigelian hernia, seems to be large, refer to surgery for evaluation. RTC 6 months
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
## 2020-06-19 ENCOUNTER — Encounter: Payer: Self-pay | Admitting: Cardiovascular Disease

## 2020-06-19 ENCOUNTER — Ambulatory Visit: Payer: Medicare Other | Admitting: Cardiovascular Disease

## 2020-06-19 ENCOUNTER — Other Ambulatory Visit: Payer: Self-pay

## 2020-06-19 VITALS — BP 122/72 | HR 50 | Ht 71.0 in | Wt 238.2 lb

## 2020-06-19 DIAGNOSIS — R001 Bradycardia, unspecified: Secondary | ICD-10-CM

## 2020-06-19 DIAGNOSIS — I480 Paroxysmal atrial fibrillation: Secondary | ICD-10-CM | POA: Diagnosis not present

## 2020-06-19 MED ORDER — METOPROLOL TARTRATE 25 MG PO TABS
50.0000 mg | ORAL_TABLET | Freq: Two times a day (BID) | ORAL | 3 refills | Status: DC
Start: 2020-06-19 — End: 2020-10-19

## 2020-06-19 NOTE — Patient Instructions (Signed)
Medication Instructions:  Your physician has recommended you make the following change in your medication:   1-Decrease metoprolol 25 mg by mouth twice daily.  *If you need a refill on your cardiac medications before your next appointment, please call your pharmacy*   Lab Work: If you have labs (blood work) drawn today and your tests are completely normal, you will receive your results only by: Marland Kitchen MyChart Message (if you have MyChart) OR . A paper copy in the mail If you have any lab test that is abnormal or we need to change your treatment, we will call you to review the results.   Testing/Procedures: None ordered today.   Follow-Up: At [facility], you and your health needs are our priority.  As part of our continuing mission to provide you with exceptional heart care, we have created designated Provider Care Teams.  These Care Teams include your primary Cardiologist (physician) and Advanced Practice Providers (APPs -  Physician Assistants and Nurse Practitioners) who all work together to provide you with the care you need, when you need it.  We recommend signing up for the patient portal called "MyChart".  Sign up information is provided on this After Visit Summary.  MyChart is used to connect with patients for Virtual Visits (Telemedicine).  Patients are able to view lab/test results, encounter notes, upcoming appointments, etc.  Non-urgent messages can be sent to your provider as well.   To learn more about what you can do with MyChart, go to [email or web address].    Your next appointment:   6 month(s)  The format for your next appointment:   In Person  Provider:   You may see Jenkins Rouge, MD or one of the following Advanced Practice Providers on your designated Care Team:    Truitt Merle, NP  Cecilie Kicks, NP  Kathyrn Drown, NP

## 2020-06-20 ENCOUNTER — Other Ambulatory Visit: Payer: Self-pay | Admitting: Internal Medicine

## 2020-06-21 ENCOUNTER — Other Ambulatory Visit: Payer: Self-pay

## 2020-06-21 MED ORDER — DILTIAZEM HCL ER 180 MG PO CP24
ORAL_CAPSULE | ORAL | 1 refills | Status: DC
Start: 2020-06-21 — End: 2021-01-04

## 2020-07-18 ENCOUNTER — Telehealth: Payer: Self-pay | Admitting: *Deleted

## 2020-07-18 DIAGNOSIS — K439 Ventral hernia without obstruction or gangrene: Secondary | ICD-10-CM | POA: Diagnosis not present

## 2020-07-18 NOTE — Telephone Encounter (Signed)
   Primary Cardiologist: Charlton Haws, MD  Chart reviewed as part of pre-operative protocol coverage. Given past medical history and time since last visit, based on ACC/AHA guidelines, Rodney Sawyer would be at acceptable risk for the planned procedure without further cardiovascular testing. He was recently seen 06/19/20 by Dr. Eden Emms with no CV complaints ans was doing well at that time.   Below are anticoagulation holding recommendations per pharmacy:  Patient with diagnosis of afib on Eliquis for anticoagulation.    Procedure: ROBOTIC VENTRAL HERNIA REPAIR w/MESH Date of procedure: TBD  CHA2DS2-VASc Score = 3  This indicates a 3.2% annual risk of stroke. The patient's score is based upon: CHF History: No HTN History: Yes Diabetes History: No Stroke History: No Vascular Disease History: No Age Score: 2 Gender Score: 0   CrCl 70 ml/min  Per office protocol, patient can hold Eliquis for 2 days prior to procedure.    The patient was advised that if he develops new symptoms prior to surgery to contact our office to arrange for a follow-up visit, and he verbalized understanding.  I will route this recommendation to the requesting party via Epic fax function and remove from pre-op pool.  Please call with questions.  Georgie Chard, NP 07/18/2020, 4:11 PM

## 2020-07-18 NOTE — Telephone Encounter (Signed)
Patient with diagnosis of afib on Eliquis for anticoagulation.    Procedure: ROBOTIC VENTRAL HERNIA REPAIR w/MESH  Date of procedure: TBD    CHA2DS2-VASc Score = 3  This indicates a 3.2% annual risk of stroke. The patient's score is based upon: CHF History: No HTN History: Yes Diabetes History: No Stroke History: No Vascular Disease History: No Age Score: 2 Gender Score: 0     CrCl 70 ml/min  Per office protocol, patient can hold Eliquis for 2 days prior to procedure.

## 2020-07-18 NOTE — Telephone Encounter (Signed)
   [facility] Pre-operative Risk Assessment    HEARTCARE STAFF: - Please ensure there is not already an duplicate clearance open for this procedure. - Under Visit Info/Reason for Call, type in Other and utilize the format Clearance MM/DD/YY or Clearance TBD. Do not use dashes or single digits. - If request is for dental extraction, please clarify the # of teeth to be extracted.  Request for surgical clearance:  1. What type of surgery is being performed? ROBOTIC VENTRAL HERNIA REPAIR w/MESH   2. When is this surgery scheduled? TBD   3. What type of clearance is required (medical clearance vs. Pharmacy clearance to hold med vs. Both)? BOTH  4. Are there any medications that need to be held prior to surgery and how long? ELIQUIS   5. Practice name and name of physician performing surgery? [facility]; DR. Anne Hahn RAMIREZ   6. What is the office phone number? [phone]   7.   What is the office fax number? [phone]  8.   Anesthesia type (None, local, MAC, general) ? GENERAL   Rodney Sawyer 07/18/2020, 2:19 PM  _________________________________________________________________   (provider comments below)

## 2020-07-21 ENCOUNTER — Other Ambulatory Visit: Payer: Self-pay | Admitting: General Surgery

## 2020-07-21 DIAGNOSIS — K439 Ventral hernia without obstruction or gangrene: Secondary | ICD-10-CM

## 2020-07-23 ENCOUNTER — Other Ambulatory Visit: Payer: Self-pay | Admitting: Internal Medicine

## 2020-08-07 ENCOUNTER — Ambulatory Visit
Admission: RE | Admit: 2020-08-07 | Discharge: 2020-08-07 | Disposition: A | Discharge status: Home / Self Care | Payer: Medicare Other | Source: Ambulatory Visit | Attending: General Surgery | Admitting: General Surgery

## 2020-08-07 ENCOUNTER — Other Ambulatory Visit: Payer: Self-pay

## 2020-08-07 DIAGNOSIS — K439 Ventral hernia without obstruction or gangrene: Secondary | ICD-10-CM | POA: Diagnosis not present

## 2020-08-07 DIAGNOSIS — R1903 Right lower quadrant abdominal swelling, mass and lump: Secondary | ICD-10-CM | POA: Diagnosis not present

## 2020-08-07 DIAGNOSIS — K469 Unspecified abdominal hernia without obstruction or gangrene: Secondary | ICD-10-CM | POA: Diagnosis not present

## 2020-08-23 ENCOUNTER — Ambulatory Visit: Payer: Self-pay | Admitting: General Surgery

## 2020-08-23 DIAGNOSIS — K439 Ventral hernia without obstruction or gangrene: Secondary | ICD-10-CM | POA: Diagnosis not present

## 2020-08-23 NOTE — H&P (Signed)
History of Present Illness Ralene Ok MD; 08/23/2020 3:59 PM) The patient is a 75 year old male who presents with an abdominal wall hernia. Patient is a 75 year old male with a right-sided spigelian reveals a right-sided hernia.  Patient has been cleared from cardiology to hold it until it was.  CT scan There appears to be bowel within the hernia contents.  -----------------------------  Referred by: Dr. Larose Kells Chief Complaint: ventral hernia  Patient is a 75 year old male, with a history of A. fib-on eliquiq, HLD, he comes in secondary to ventral hernia. Patient states she's had this there for several years. He states he does feel the area bulge out, does cause him some pain at times. He states he is able to reduce on its own. He does not really see a bulge to the abdominal area. Patient denies any signs or symptoms of incarceration or strangulation.  He's had no previous abdominal wall imaging.  Patient sees Dr. Johnsie Cancel his cardiologist for his cardiac issues.   Allergies Lindwood Coke, RN; 08/23/2020 3:48 PM) No Known Drug Allergies  [07/18/2020]: Allergies Reconciled   Medication History (Diane Herrin, RN; 08/23/2020 3:49 PM) Benazepril HCl (20MG  Tablet, Oral) Active. Citalopram Hydrobromide (20MG  Tablet, Oral) Active. Dilt-XR (180MG  Capsule ER 24HR, Oral) Active. Eliquis (5MG  Tablet, Oral) Active. Flecainide Acetate (100MG  Tablet, Oral) Active. hydroCHLOROthiazide (25MG  Tablet, Oral) Active. Metoprolol Tartrate (50MG  Tablet, Oral) Active. Selenium Sulfide (2.5% Lotion, External) Active. Omega-3 (1000MG  Capsule, Oral) Active. Medications Reconciled    Review of Systems Ralene Ok, MD; 08/23/2020 4:0 PM) General Not Present- Appetite Loss, Chills, Fatigue, Fever, Night Sweats, Weight Gain and Weight Loss. Skin Not Present- Change in Wart/Mole, Dryness, Hives, Jaundice, New Lesions, Non-Healing Wounds, Rash and Ulcer. HEENT Present- Wears glasses/contact  lenses. Not Present- Earache, Hearing Loss, Hoarseness, Nose Bleed, Oral Ulcers, Ringing in the Ears, Seasonal Allergies, Sinus Pain, Sore Throat, Visual Disturbances and Yellow Eyes. Respiratory Present- Snoring. Not Present- Bloody sputum, Chronic Cough, Difficulty Breathing and Wheezing. Breast Not Present- Breast Mass, Breast Pain, Nipple Discharge and Skin Changes. Cardiovascular Not Present- Chest Pain, Difficulty Breathing Lying Down, Leg Cramps, Palpitations, Rapid Heart Rate, Shortness of Breath and Swelling of Extremities. Gastrointestinal Present- Excessive gas. Not Present- Abdominal Pain, Bloating, Bloody Stool, Change in Bowel Habits, Chronic diarrhea, Constipation, Difficulty Swallowing, Gets full quickly at meals, Hemorrhoids, Indigestion, Nausea, Rectal Pain and Vomiting. Male Genitourinary Not Present- Blood in Urine, Change in Urinary Stream, Frequency, Impotence, Nocturia, Painful Urination, Urgency and Urine Leakage. Neurological Present- Trouble walking and Weakness. Not Present- Decreased Memory, Fainting, Headaches, Numbness, Seizures, Tingling and Tremor. Psychiatric Not Present- Anxiety, Bipolar, Change in Sleep Pattern, Depression, Fearful and Frequent crying. Hematology Present- Blood Thinners and Easy Bruising. Not Present- Excessive bleeding, Gland problems, HIV and Persistent Infections. All other systems negative   Physical Exam Ralene Ok, MD; 08/23/2020 4:0 PM) The physical exam findings are as follows: Note: Constitutional: No acute distress, conversant, appears stated age  Eyes: Anicteric sclerae, moist conjunctiva, no lid lag  Neck: No thyromegaly, trachea midline, no cervical lymphadenopathy  Lungs: Clear to auscultation biilaterally, normal respiratory effot  Cardiovascular: regular rate & rhythm, no murmurs, no peripheal edema, pedal pulses 2+  GI: Soft, no masses or hepatosplenomegaly, non-tender to palpation  MSK: Normal gait, no clubbing  cyanosis, edema  Skin: No rashes, palpation reveals normal skin turgor  Psychiatric: Appropriate judgment and insight, oriented to person, place, and time  Abdomen Inspection Hernias - Ventral - Reducible. Note: in the spegelianTriangle, reducible.    Assessment &  Plan Ralene Ok MD; 08/23/2020 4:00 PM) VENTRAL HERNIA WITHOUT OBSTRUCTION OR GANGRENE (K43.9) Impression: Patient is a 75 year old male with a right-sided spigelian hernia  1. Will proceed to the operating room for a robotic right-sided spigelian hernia repair with mesh.  2. All risks and benefits were discussed with the patient to generally include, but not limited to: infection, bleeding, damage to surrounding structures, acute and chronic nerve pain, and recurrence. Alternatives were offered and described. All questions were answered and the patient voiced understanding of the procedure and wishes to proceed at this point with hernia repair.

## 2020-08-29 ENCOUNTER — Other Ambulatory Visit: Payer: Self-pay | Admitting: Cardiovascular Disease

## 2020-08-29 NOTE — Telephone Encounter (Signed)
Pt last saw Dr Johnsie Cancel 06/19/20, last labs 05/31/20 Creat 1.14, age 75, weight 108kg, based on specified criteria pt is on appropriate dosage of Eliquis 5mg  BID for afib.  Will refill rx.

## 2020-10-07 ENCOUNTER — Other Ambulatory Visit (HOSPITAL_COMMUNITY)
Admission: RE | Admit: 2020-10-07 | Discharge: 2020-10-07 | Disposition: A | Discharge status: Home / Self Care | Payer: Medicare Other | Source: Ambulatory Visit | Attending: General Surgery | Admitting: General Surgery

## 2020-10-07 DIAGNOSIS — Z01812 Encounter for preprocedural laboratory examination: Secondary | ICD-10-CM | POA: Insufficient documentation

## 2020-10-07 DIAGNOSIS — Z20822 Contact with and (suspected) exposure to covid-19: Secondary | ICD-10-CM | POA: Diagnosis not present

## 2020-10-07 LAB — SARS CORONAVIRUS 2 (TAT 6-24 HRS): SARS Coronavirus 2: NEGATIVE

## 2020-10-09 ENCOUNTER — Encounter (HOSPITAL_COMMUNITY): Payer: Self-pay | Admitting: General Surgery

## 2020-10-09 NOTE — Progress Notes (Signed)
Spoke with pt for pre-op call. Pt has hx of A-fib and pt's cardiologist is Dr. Johnsie Cancel. Cardiac clearance noted on 07/18/20 in Epic. Pt's last dose of Eliquis was 10/07/20 PM dose (instructed to hold 2 days prior).   Covid test done 10/07/20 and it's negative. Pt states he's been in quarantine since the test was done and understands that he stays in quarantine until he comes to the hospital tomorrow.

## 2020-10-09 NOTE — Anesthesia Preprocedure Evaluation (Addendum)
Anesthesia Evaluation  Patient identified by MRN, date of birth, ID band Patient awake    Reviewed: Allergy & Precautions, NPO status , Patient's Chart, lab work & pertinent test results, reviewed documented beta blocker date and time   History of Anesthesia Complications Negative for: history of anesthetic complications  Airway Mallampati: II  TM Distance: >3 FB Neck ROM: Full    Dental  (+) Missing, Caps, Dental Advisory Given   Pulmonary neg pulmonary ROS,  10/07/2020 SARS coronavirus NEG   breath sounds clear to auscultation       Cardiovascular hypertension, Pt. on medications and Pt. on home beta blockers (-) angina+ dysrhythmias Atrial Fibrillation  Rhythm:Irregular Rate:Normal  '16 ECHO: EF 50-55%, grade 1 DD, no significant valvular disease   Neuro/Psych Anxiety Depression negative neurological ROS     GI/Hepatic Neg liver ROS, GERD  Controlled,  Endo/Other  Morbid obesity  Renal/GU negative Renal ROS     Musculoskeletal   Abdominal (+) + obese,   Peds  Hematology eliquis   Anesthesia Other Findings   Reproductive/Obstetrics                            Anesthesia Physical Anesthesia Plan  ASA: III  Anesthesia Plan: General   Post-op Pain Management:    Induction: Intravenous  PONV Risk Score and Plan: 2 and Ondansetron and Dexamethasone  Airway Management Planned: Oral ETT  Additional Equipment: None  Intra-op Plan:   Post-operative Plan: Extubation in OR  Informed Consent: I have reviewed the patients History and Physical, chart, labs and discussed the procedure including the risks, benefits and alternatives for the proposed anesthesia with the patient or authorized representative who has indicated his/her understanding and acceptance.     Dental advisory given  Plan Discussed with: CRNA and Surgeon  Anesthesia Plan Comments:        Anesthesia Quick  Evaluation

## 2020-10-10 ENCOUNTER — Other Ambulatory Visit: Payer: Self-pay

## 2020-10-10 ENCOUNTER — Ambulatory Visit (HOSPITAL_COMMUNITY): Payer: Medicare Other | Admitting: Anesthesiology

## 2020-10-10 ENCOUNTER — Encounter (HOSPITAL_COMMUNITY)
Admission: RE | Disposition: A | Discharge status: Home / Self Care | Payer: Self-pay | Source: Home / Self Care | Attending: General Surgery

## 2020-10-10 ENCOUNTER — Ambulatory Visit (HOSPITAL_COMMUNITY)
Admission: RE | Admit: 2020-10-10 | Discharge: 2020-10-10 | Disposition: A | Discharge status: Home / Self Care | Payer: Medicare Other | Attending: General Surgery | Admitting: General Surgery

## 2020-10-10 ENCOUNTER — Encounter (HOSPITAL_COMMUNITY): Payer: Self-pay | Admitting: General Surgery

## 2020-10-10 DIAGNOSIS — E785 Hyperlipidemia, unspecified: Secondary | ICD-10-CM | POA: Diagnosis not present

## 2020-10-10 DIAGNOSIS — I480 Paroxysmal atrial fibrillation: Secondary | ICD-10-CM | POA: Diagnosis not present

## 2020-10-10 DIAGNOSIS — Z79899 Other long term (current) drug therapy: Secondary | ICD-10-CM | POA: Insufficient documentation

## 2020-10-10 DIAGNOSIS — K436 Other and unspecified ventral hernia with obstruction, without gangrene: Secondary | ICD-10-CM | POA: Diagnosis not present

## 2020-10-10 DIAGNOSIS — I4891 Unspecified atrial fibrillation: Secondary | ICD-10-CM | POA: Insufficient documentation

## 2020-10-10 DIAGNOSIS — I10 Essential (primary) hypertension: Secondary | ICD-10-CM | POA: Diagnosis not present

## 2020-10-10 DIAGNOSIS — K439 Ventral hernia without obstruction or gangrene: Secondary | ICD-10-CM | POA: Diagnosis not present

## 2020-10-10 DIAGNOSIS — Z7901 Long term (current) use of anticoagulants: Secondary | ICD-10-CM | POA: Diagnosis not present

## 2020-10-10 DIAGNOSIS — F418 Other specified anxiety disorders: Secondary | ICD-10-CM | POA: Diagnosis not present

## 2020-10-10 HISTORY — PX: XI ROBOTIC ASSISTED VENTRAL HERNIA: SHX6789

## 2020-10-10 HISTORY — PX: INSERTION OF MESH: SHX5868

## 2020-10-10 LAB — CBC
HCT: 41.7 % (ref 39.0–52.0)
Hemoglobin: 14.5 g/dL (ref 13.0–17.0)
MCH: 31.5 pg (ref 26.0–34.0)
MCHC: 34.8 g/dL (ref 30.0–36.0)
MCV: 90.7 fL (ref 80.0–100.0)
Platelets: 267 10*3/uL (ref 150–400)
RBC: 4.6 MIL/uL (ref 4.22–5.81)
RDW: 13.1 % (ref 11.5–15.5)
WBC: 8 10*3/uL (ref 4.0–10.5)
nRBC: 0 % (ref 0.0–0.2)

## 2020-10-10 LAB — BASIC METABOLIC PANEL
Anion gap: 8 (ref 5–15)
BUN: 17 mg/dL (ref 8–23)
CO2: 26 mmol/L (ref 22–32)
Calcium: 8.6 mg/dL — ABNORMAL LOW (ref 8.9–10.3)
Chloride: 98 mmol/L (ref 98–111)
Creatinine, Ser: 1.08 mg/dL (ref 0.61–1.24)
GFR, Estimated: >60 mL/min (ref >60)
Glucose, Bld: 111 mg/dL — ABNORMAL HIGH (ref 70–99)
Potassium: 3.5 mmol/L (ref 3.5–5.1)
Sodium: 132 mmol/L — ABNORMAL LOW (ref 135–145)

## 2020-10-10 SURGERY — REPAIR, HERNIA, VENTRAL, ROBOT-ASSISTED
Anesthesia: General | Site: Abdomen

## 2020-10-10 MED ORDER — ONDANSETRON HCL 4 MG/2ML IJ SOLN
INTRAMUSCULAR | Status: DC | PRN
  Administered 2020-10-10: 4 mg via INTRAVENOUS

## 2020-10-10 MED ORDER — PROPOFOL 10 MG/ML IV BOLUS
INTRAVENOUS | Status: DC | PRN
  Administered 2020-10-10: 150 mg via INTRAVENOUS

## 2020-10-10 MED ORDER — CEFAZOLIN SODIUM-DEXTROSE 2-4 GM/100ML-% IV SOLN
2.0000 g | INTRAVENOUS | Status: AC
  Administered 2020-10-10: 2 g via INTRAVENOUS
  Filled 2020-10-10: qty 100

## 2020-10-10 MED ORDER — TRAMADOL HCL 50 MG PO TABS: 50.0000 mg | ORAL_TABLET | Freq: Four times a day (QID) | ORAL | 0 refills | Status: DC | PRN

## 2020-10-10 MED ORDER — OXYCODONE HCL 5 MG/5ML PO SOLN: 5.0000 mg | Freq: Once | ORAL | Status: DC | PRN

## 2020-10-10 MED ORDER — SODIUM CHLORIDE 0.9 % IV SOLN
INTRAVENOUS | Status: DC | PRN
  Administered 2020-10-10: 20 mL

## 2020-10-10 MED ORDER — ONDANSETRON HCL 4 MG/2ML IJ SOLN
INTRAMUSCULAR | Status: AC
  Filled 2020-10-10: qty 2

## 2020-10-10 MED ORDER — CHLORHEXIDINE GLUCONATE CLOTH 2 % EX PADS: 6.0000 | MEDICATED_PAD | Freq: Once | CUTANEOUS | Status: DC

## 2020-10-10 MED ORDER — BUPIVACAINE HCL (PF) 0.25 % IJ SOLN
INTRAMUSCULAR | Status: AC
  Filled 2020-10-10: qty 30

## 2020-10-10 MED ORDER — LIDOCAINE 2% (20 MG/ML) 5 ML SYRINGE
INTRAMUSCULAR | Status: DC | PRN
  Administered 2020-10-10: 40 mg via INTRAVENOUS

## 2020-10-10 MED ORDER — STERILE WATER FOR IRRIGATION IR SOLN
Status: DC | PRN
  Administered 2020-10-10: 1000 mL

## 2020-10-10 MED ORDER — BUPIVACAINE HCL 0.25 % IJ SOLN
INTRAMUSCULAR | Status: DC | PRN
  Administered 2020-10-10: 7 mL

## 2020-10-10 MED ORDER — PROMETHAZINE HCL 25 MG/ML IJ SOLN: 6.2500 mg | INTRAMUSCULAR | Status: DC | PRN

## 2020-10-10 MED ORDER — MIDAZOLAM HCL 2 MG/2ML IJ SOLN: 0.5000 mg | Freq: Once | INTRAMUSCULAR | Status: DC | PRN

## 2020-10-10 MED ORDER — PROPOFOL 10 MG/ML IV BOLUS
INTRAVENOUS | Status: AC
  Filled 2020-10-10: qty 20

## 2020-10-10 MED ORDER — PHENYLEPHRINE HCL-NACL 10-0.9 MG/250ML-% IV SOLN
INTRAVENOUS | Status: DC | PRN
  Administered 2020-10-10: 40 ug/min via INTRAVENOUS

## 2020-10-10 MED ORDER — HYDROMORPHONE HCL 1 MG/ML IJ SOLN: 0.2500 mg | INTRAMUSCULAR | Status: DC | PRN

## 2020-10-10 MED ORDER — MEPERIDINE HCL 25 MG/ML IJ SOLN
6.2500 mg | INTRAMUSCULAR | Status: DC | PRN
Start: 2020-10-10 — End: 2020-10-10

## 2020-10-10 MED ORDER — EPHEDRINE 5 MG/ML INJ
INTRAVENOUS | Status: AC
  Filled 2020-10-10: qty 10

## 2020-10-10 MED ORDER — CHLORHEXIDINE GLUCONATE 0.12 % MT SOLN
15.0000 mL | Freq: Once | OROMUCOSAL | Status: AC
  Administered 2020-10-10: 15 mL via OROMUCOSAL
  Filled 2020-10-10: qty 15

## 2020-10-10 MED ORDER — ACETAMINOPHEN 500 MG PO TABS
1000.0000 mg | ORAL_TABLET | Freq: Once | ORAL | Status: AC
  Administered 2020-10-10: 1000 mg via ORAL
  Filled 2020-10-10: qty 2

## 2020-10-10 MED ORDER — ROCURONIUM BROMIDE 10 MG/ML (PF) SYRINGE
PREFILLED_SYRINGE | INTRAVENOUS | Status: DC | PRN
  Administered 2020-10-10: 70 mg via INTRAVENOUS

## 2020-10-10 MED ORDER — OXYCODONE HCL 5 MG PO TABS
5.0000 mg | ORAL_TABLET | Freq: Once | ORAL | Status: DC | PRN
Start: 2020-10-10 — End: 2020-10-10

## 2020-10-10 MED ORDER — DEXAMETHASONE SODIUM PHOSPHATE 10 MG/ML IJ SOLN
INTRAMUSCULAR | Status: DC | PRN
  Administered 2020-10-10: 10 mg via INTRAVENOUS

## 2020-10-10 MED ORDER — ORAL CARE MOUTH RINSE: 15.0000 mL | Freq: Once | OROMUCOSAL | Status: AC

## 2020-10-10 MED ORDER — DEXAMETHASONE SODIUM PHOSPHATE 10 MG/ML IJ SOLN
INTRAMUSCULAR | Status: AC
  Filled 2020-10-10: qty 1

## 2020-10-10 MED ORDER — BUPIVACAINE LIPOSOME 1.3 % IJ SUSP
INTRAMUSCULAR | Status: AC
  Filled 2020-10-10: qty 20

## 2020-10-10 MED ORDER — EPHEDRINE SULFATE-NACL 50-0.9 MG/10ML-% IV SOSY
PREFILLED_SYRINGE | INTRAVENOUS | Status: DC | PRN
  Administered 2020-10-10: 10 mg via INTRAVENOUS

## 2020-10-10 MED ORDER — FENTANYL CITRATE (PF) 250 MCG/5ML IJ SOLN
INTRAMUSCULAR | Status: DC | PRN
  Administered 2020-10-10: 150 ug via INTRAVENOUS
  Administered 2020-10-10: 50 ug via INTRAVENOUS

## 2020-10-10 MED ORDER — 0.9 % SODIUM CHLORIDE (POUR BTL) OPTIME
TOPICAL | Status: DC | PRN
  Administered 2020-10-10: 1000 mL

## 2020-10-10 MED ORDER — SUGAMMADEX SODIUM 200 MG/2ML IV SOLN
INTRAVENOUS | Status: DC | PRN
  Administered 2020-10-10: 200 mg via INTRAVENOUS

## 2020-10-10 MED ORDER — FENTANYL CITRATE (PF) 250 MCG/5ML IJ SOLN
INTRAMUSCULAR | Status: AC
  Filled 2020-10-10: qty 5

## 2020-10-10 MED ORDER — LACTATED RINGERS IV SOLN: INTRAVENOUS | Status: DC

## 2020-10-10 SURGICAL SUPPLY — 39 items
CHLORAPREP W/TINT 26 (MISCELLANEOUS) ×2 IMPLANT
COVER MAYO STAND STRL (DRAPES) ×2 IMPLANT
COVER SURGICAL LIGHT HANDLE (MISCELLANEOUS) ×2 IMPLANT
COVER TIP SHEARS 8 DVNC (MISCELLANEOUS) ×1 IMPLANT
COVER TIP SHEARS 8MM DA VINCI (MISCELLANEOUS) ×2
DEFOGGER SCOPE WARMER CLEARIFY (MISCELLANEOUS) ×2 IMPLANT
DERMABOND ADVANCED (GAUZE/BANDAGES/DRESSINGS) ×1
DERMABOND ADVANCED .7 DNX12 (GAUZE/BANDAGES/DRESSINGS) ×1 IMPLANT
DEVICE TROCAR PUNCTURE CLOSURE (ENDOMECHANICALS) ×2 IMPLANT
DRAPE ARM DVNC X/XI (DISPOSABLE) ×4 IMPLANT
DRAPE COLUMN DVNC XI (DISPOSABLE) ×1 IMPLANT
DRAPE CV SPLIT W-CLR ANES SCRN (DRAPES) ×2 IMPLANT
DRAPE DA VINCI XI ARM (DISPOSABLE) ×8
DRAPE DA VINCI XI COLUMN (DISPOSABLE) ×2
DRAPE ORTHO SPLIT 77X108 STRL (DRAPES) ×2
DRAPE SURG ORHT 6 SPLT 77X108 (DRAPES) ×1 IMPLANT
GLOVE BIO SURGEON STRL SZ7.5 (GLOVE) ×4 IMPLANT
GOWN STRL REUS W/ TWL LRG LVL3 (GOWN DISPOSABLE) ×2 IMPLANT
GOWN STRL REUS W/ TWL XL LVL3 (GOWN DISPOSABLE) ×2 IMPLANT
GOWN STRL REUS W/TWL 2XL LVL3 (GOWN DISPOSABLE) ×2 IMPLANT
GOWN STRL REUS W/TWL LRG LVL3 (GOWN DISPOSABLE) ×4
GOWN STRL REUS W/TWL XL LVL3 (GOWN DISPOSABLE) ×4
KIT BASIN OR (CUSTOM PROCEDURE TRAY) ×2 IMPLANT
KIT TURNOVER KIT B (KITS) ×2 IMPLANT
MARKER SKIN DUAL TIP RULER LAB (MISCELLANEOUS) ×2 IMPLANT
MESH PROGRIP LAP SELF FIXATING (Mesh General) ×2 IMPLANT
MESH PROGRIP LAP SLF FIX 16X12 (Mesh General) ×1 IMPLANT
NEEDLE HYPO 22GX1.5 SAFETY (NEEDLE) ×2 IMPLANT
PAD ARMBOARD 7.5X6 YLW CONV (MISCELLANEOUS) ×4 IMPLANT
SEAL CANN UNIV 5-8 DVNC XI (MISCELLANEOUS) ×3 IMPLANT
SEAL XI 5MM-8MM UNIVERSAL (MISCELLANEOUS) ×6
SET TUBE SMOKE EVAC HIGH FLOW (TUBING) ×2 IMPLANT
STOPCOCK 4 WAY LG BORE MALE ST (IV SETS) ×2 IMPLANT
SUT MNCRL AB 4-0 PS2 18 (SUTURE) ×2 IMPLANT
SUT VIC AB 2-0 SH 27 (SUTURE) ×4
SUT VIC AB 2-0 SH 27X BRD (SUTURE) ×2 IMPLANT
SUT VLOC 180 2-0 6IN GS21 (SUTURE) ×4 IMPLANT
TOWEL GREEN STERILE FF (TOWEL DISPOSABLE) ×2 IMPLANT
TRAY LAPAROSCOPIC MC (CUSTOM PROCEDURE TRAY) ×2 IMPLANT

## 2020-10-10 NOTE — H&P (Signed)
History of Present IllnessThe patient is a 75 year old male who presents with an abdominal wall hernia. Patient is a 75 year old male with a right-sided spigelian reveals a right-sided hernia.  Patient has been cleared from cardiology to hold it until it was.  CT scan There appears to be bowel within the hernia contents.  -----------------------------  Referred by: Dr. Larose Kells Chief Complaint: ventral hernia  Patient is a 75 year old male, with a history of A. fib-on eliquiq, HLD, he comes in secondary to ventral hernia. Patient states she's had this there for several years. He states he does feel the area bulge out, does cause him some pain at times. He states he is able to reduce on its own. He does not really see a bulge to the abdominal area. Patient denies any signs or symptoms of incarceration or strangulation.  He's had no previous abdominal wall imaging.  Patient sees Dr. Johnsie Cancel his cardiologist for his cardiac issues.   Allergie No Known Drug Allergies  [07/18/2020]: Allergies Reconciled   Medication History Benazepril HCl (20MG  Tablet, Oral) Active. Citalopram Hydrobromide (20MG  Tablet, Oral) Active. Dilt-XR (180MG  Capsule ER 24HR, Oral) Active. Eliquis (5MG  Tablet, Oral) Active. Flecainide Acetate (100MG  Tablet, Oral) Active. hydroCHLOROthiazide (25MG  Tablet, Oral) Active. Metoprolol Tartrate (50MG  Tablet, Oral) Active. Selenium Sulfide (2.5% Lotion, External) Active. Omega-3 (1000MG  Capsule, Oral) Active. Medications Reconciled    Review of Systems General Not Present- Appetite Loss, Chills, Fatigue, Fever, Night Sweats, Weight Gain and Weight Loss. Skin Not Present- Change in Wart/Mole, Dryness, Hives, Jaundice, New Lesions, Non-Healing Wounds, Rash and Ulcer. HEENT Present- Wears glasses/contact lenses. Not Present- Earache, Hearing Loss, Hoarseness, Nose Bleed, Oral Ulcers, Ringing in the Ears, Seasonal Allergies, Sinus Pain, Sore  Throat, Visual Disturbances and Yellow Eyes. Respiratory Present- Snoring. Not Present- Bloody sputum, Chronic Cough, Difficulty Breathing and Wheezing. Breast Not Present- Breast Mass, Breast Pain, Nipple Discharge and Skin Changes. Cardiovascular Not Present- Chest Pain, Difficulty Breathing Lying Down, Leg Cramps, Palpitations, Rapid Heart Rate, Shortness of Breath and Swelling of Extremities. Gastrointestinal Present- Excessive gas. Not Present- Abdominal Pain, Bloating, Bloody Stool, Change in Bowel Habits, Chronic diarrhea, Constipation, Difficulty Swallowing, Gets full quickly at meals, Hemorrhoids, Indigestion, Nausea, Rectal Pain and Vomiting. Male Genitourinary Not Present- Blood in Urine, Change in Urinary Stream, Frequency, Impotence, Nocturia, Painful Urination, Urgency and Urine Leakage. Neurological Present- Trouble walking and Weakness. Not Present- Decreased Memory, Fainting, Headaches, Numbness, Seizures, Tingling and Tremor. Psychiatric Not Present- Anxiety, Bipolar, Change in Sleep Pattern, Depression, Fearful and Frequent crying. Hematology Present- Blood Thinners and Easy Bruising. Not Present- Excessive bleeding, Gland problems, HIV and Persistent Infections. All other systems negative   Physical Exam The physical exam findings are as follows: Note: Constitutional: No acute distress, conversant, appears stated age  Eyes: Anicteric sclerae, moist conjunctiva, no lid lag  Neck: No thyromegaly, trachea midline, no cervical lymphadenopathy  Lungs: Clear to auscultation biilaterally, normal respiratory effot  Cardiovascular: regular rate & rhythm, no murmurs, no peripheal edema, pedal pulses 2+  GI: Soft, no masses or hepatosplenomegaly, non-tender to palpation  MSK: Normal gait, no clubbing cyanosis, edema  Skin: No rashes, palpation reveals normal skin turgor  Psychiatric: Appropriate judgment and insight, oriented to person, place, and  time  Abdomen Inspection Hernias - Ventral - Reducible. Note: in the spegelianTriangle, reducible.    Assessment & Plan VENTRAL HERNIA WITHOUT OBSTRUCTION OR GANGRENE (K43.9) Impression: Patient is a 74 year old male with a right-sided spigelian hernia  1. Will proceed to the operating room for a robotic  right-sided spigelian hernia repair with mesh.  2. All risks and benefits were discussed with the patient to generally include, but not limited to: infection, bleeding, damage to surrounding structures, acute and chronic nerve pain, and recurrence. Alternatives were offered and described. All questions were answered and the patient voiced understanding of the procedure and wishes to proceed at this point with hernia repair.

## 2020-10-10 NOTE — Op Note (Signed)
10/10/2020  8:46 AM  PATIENT:  Rodney Sawyer  75 y.o. male  PRE-OPERATIVE DIAGNOSIS:  HERNIA  POST-OPERATIVE DIAGNOSIS:  HERNIA  PROCEDURE:  Procedure(s): XI ROBOTIC ASSISTED VENTRAL HERNIA REPAIR WITH MESH (N/A)- RIGHT SPEGLIAN INSERTION OF MESH (N/A)  SURGEON:  Surgeon(s) and Role:    * Ralene Ok, MD - Primary  PHYSICIAN ASSISTANT: Pryor Curia, RNFA  ANESTHESIA:   local and general  EBL:  minimal   BLOOD ADMINISTERED:none  DRAINS: none   LOCAL MEDICATIONS USED:  BUPIVICAINE  and OTHER exparil  SPECIMEN:  No Specimen  DISPOSITION OF SPECIMEN:  N/A  COUNTS:  YES  TOURNIQUET:  * No tourniquets in log *  DICTATION: .Dragon Dictation Indication procedure: Patient is a 75 year old male with right lower quadrant abdominal pain. Right spigelian hernia with incarcerated colon seen on CT.  Secondary to continued pain, patient was constipation patient was taken back to the operating room electively for hernia repair.  Findings: Patient had an incarcerated portion of his right colon within the spigelian hernia.  The hernia defect measured approximately 4cm.  The colon was completely reduced.  Preperitoneal fat was within the spigelian hernia which was also reduced.  A piece of  progrip 16x12 mesh was placed within the preperitoneal space.  Details of procedure: After the patient was consented the patient was taken back to the operating room and placed in the supine position with bilateral SCDs in place.  He underwent general trach intubation.  Patient was prepped draped sterile fashion.  Timeout was called all facts verified.  A Veress needle technique was used insufflate and 15 m of mercury in the left upper quadrant.  Subsequent to this a 8 mm robotic trocar was then placed intra-abdominally.  There is no injury to any intra-abdominal organs.  At this time the camera was placed.  There appeared to be an incarcerated right lower quadrant spigelian hernia that appeared  to be incarcerated with colon.  At this time 2 8-millimeter trochars were then placed in the right upper quadrant as well as left lower quadrant under direct visualization.  At this time the robotic patient cart was docked.  At this time I proceeded to intra-abdominally lysing adhesions of the colon to the anterior abdominal wall.  These were lysed with both sharp and cautery dissection to help maintain hemostasis.  The colon was incarcerated into the right spigelian hernia.  This was reduced completely.  At this time a peritoneal flap was then dissected to enter the preperitoneal space.  At this time the peritoneum was taken down and dissection was taken down to the hernia itself.  The preperitoneal fat that was also within the hernia was bluntly dissected away from the hernia neck.  Once this was completely reduced.  The hernia measured approximately 4cm.  At this time a 2-0 V lock stitch was used to reapproximate the muscle layer in a running standard fashion.  At this time the preperitoneal space was measured.  This measured approximately 16x12cm. At this time a piece of 12x16 cm Progrip mesh.  This was introduced into the abdominal cavity.  This was then placed to the preperitoneal space.  This lay flat.   At this time the peritoneum was then closed.  This was done with a running 2-0 V-Lock suture.  An area of peritoneum was brought over a thin peritoneal area over the previous hernia site.  At this time the insufflation was evacuated.  The robot cart was undocked.  Trochars were removed.  Trocar sites were then reapproximated using 4-0 Monocryl subcuticular fashion.  Skin was dressed with Dermabond.    The patient tolerated the procedure well was taken to the recovery in stable condition.   PLAN OF CARE: Discharge to home after PACU  PATIENT DISPOSITION:  PACU - hemodynamically stable.   Delay start of Pharmacological VTE agent (>24hrs) due to surgical blood loss or risk of bleeding: not  applicable

## 2020-10-10 NOTE — Transfer of Care (Signed)
Immediate Anesthesia Transfer of Care Note  Patient: Rodney Sawyer  Procedure(s) Performed: XI ROBOTIC ASSISTED VENTRAL HERNIA REPAIR WITH MESH (N/A Abdomen) INSERTION OF MESH (N/A Abdomen)  Patient Location: PACU  Anesthesia Type:General  Level of Consciousness: awake, alert  and oriented  Airway & Oxygen Therapy: Patient Spontanous Breathing  Post-op Assessment: Report given to RN, Post -op Vital signs reviewed and stable and Patient moving all extremities X 4  Post vital signs: Reviewed and stable  Last Vitals:  Vitals Value Taken Time  BP 122/66 10/10/20 0903  Temp    Pulse 54 10/10/20 0905  Resp 16 10/10/20 0905  SpO2 94 % 10/10/20 0905  Vitals shown include unvalidated device data.  Last Pain:  Vitals:   10/10/20 0702  TempSrc:   PainSc: 0-No pain      Patients Stated Pain Goal: 3 (00/92/33 0076)  Complications: No complications documented.

## 2020-10-10 NOTE — Discharge Instructions (Signed)
[facility] _______[facility] °INGUINAL HERNIA REPAIR: POST OP INSTRUCTIONS ° °Always review your discharge instruction sheet given to you by the facility where your surgery was performed. °IF YOU HAVE DISABILITY OR FAMILY LEAVE FORMS, YOU MUST BRING THEM TO THE OFFICE FOR PROCESSING.   °DO NOT GIVE THEM TO YOUR DOCTOR. ° °1. A  prescription for pain medication may be given to you upon discharge.  Take your pain medication as prescribed, if needed.  If narcotic pain medicine is not needed, then you may take acetaminophen (Tylenol) or ibuprofen (Advil) as needed. °2. Take your usually prescribed medications unless otherwise directed. °If you need a refill on your pain medication, please contact your pharmacy.  They will contact our office to request authorization. Prescriptions will not be filled after 5 pm or on week-ends. °3. You should follow a light diet the first 24 hours after arrival home, such as soup and crackers, etc.  Be sure to include lots of fluids daily.  Resume your normal diet the day after surgery. °4.Most patients will experience some swelling and bruising around the umbilicus or in the groin and scrotum.  Ice packs and reclining will help.  Swelling and bruising can take several days to resolve.  °6. It is common to experience some constipation if taking pain medication after surgery.  Increasing fluid intake and taking a stool softener (such as Colace) will usually help or prevent this problem from occurring.  A mild laxative (Milk of Magnesia or Miralax) should be taken according to package directions if there are no bowel movements after 48 hours. °7. Unless discharge instructions indicate otherwise, you may remove your bandages 24-48 hours after surgery, and you may shower at that time.  You may have steri-strips (small skin tapes) in place directly over the incision.  These strips should be left on the skin for 7-10 days.  If your surgeon used skin glue on the incision, you may  shower in 24 hours.  The glue will flake off over the next 2-3 weeks.  Any sutures or staples will be removed at the office during your follow-up visit. °8. ACTIVITIES:  You may resume regular (light) daily activities beginning the next day--such as daily self-care, walking, climbing stairs--gradually increasing activities as tolerated.  You may have sexual intercourse when it is comfortable.  Refrain from any heavy lifting or straining until approved by your doctor. ° °a.You may drive when you are no longer taking prescription pain medication, you can comfortably wear a seatbelt, and you can safely maneuver your car and apply brakes. °b.RETURN TO WORK:   °_____________________________________________ ° °9.You should see your doctor in the office for a follow-up appointment approximately 2-3 weeks after your surgery.  Make sure that you call for this appointment within a day or two after you arrive home to insure a convenient appointment time. °10.OTHER INSTRUCTIONS: _________________________ °   _____________________________________ ° °WHEN TO CALL YOUR DOCTOR: °1. Fever over 101.0 °2. Inability to urinate °3. Nausea and/or vomiting °4. Extreme swelling or bruising °5. Continued bleeding from incision. °6. Increased pain, redness, or drainage from the incision ° °The clinic staff is available to answer your questions during regular business hours.  Please don’t hesitate to call and ask to speak to one of the nurses for clinical concerns.  If you have a medical emergency, go to the nearest emergency room or call 911.  A surgeon from [facility] is always on call at the hospital ° ° °[address], [address], [place], [place]  [address] ? ° [address] [address], [place], [place]   [address] °[phone] ? [phone] ? FAX [phone] °Web site: [email or web address] ° °

## 2020-10-10 NOTE — Anesthesia Postprocedure Evaluation (Signed)
Anesthesia Post Note  Patient: Rodney Sawyer  Procedure(s) Performed: XI ROBOTIC ASSISTED VENTRAL HERNIA REPAIR WITH MESH (N/A Abdomen) INSERTION OF MESH (N/A Abdomen)     Patient location during evaluation: PACU Anesthesia Type: General Level of consciousness: awake and alert, patient cooperative and oriented Pain management: pain level controlled Vital Signs Assessment: post-procedure vital signs reviewed and stable Respiratory status: spontaneous breathing, nonlabored ventilation and respiratory function stable Cardiovascular status: blood pressure returned to baseline and stable Postop Assessment: no apparent nausea or vomiting and able to ambulate Anesthetic complications: no   No complications documented.  Last Vitals:  Vitals:   10/10/20 0930 10/10/20 0945  BP: 102/67 101/66  Pulse: (!) 57 (!) 57  Resp: 16 19  Temp:  36.6 C  SpO2: 96% 92%    Last Pain:  Vitals:   10/10/20 0930  TempSrc:   PainSc: 2                  Shaasia Odle,E. Leighla Chestnutt

## 2020-10-10 NOTE — Anesthesia Procedure Notes (Signed)
Procedure Name: Intubation Date/Time: 10/10/2020 7:35 AM Performed by: Kyung Rudd, CRNA Pre-anesthesia Checklist: Patient identified, Emergency Drugs available, Suction available and Patient being monitored Patient Re-evaluated:Patient Re-evaluated prior to induction Oxygen Delivery Method: Circle system utilized Preoxygenation: Pre-oxygenation with 100% oxygen Induction Type: IV induction Ventilation: Mask ventilation without difficulty and Oral airway inserted - appropriate to patient size Laryngoscope Size: Mac and 4 Grade View: Grade II Tube type: Oral Tube size: 7.5 mm Number of attempts: 1 Airway Equipment and Method: Stylet Placement Confirmation: ETT inserted through vocal cords under direct vision,  positive ETCO2 and breath sounds checked- equal and bilateral Secured at: 22 cm Tube secured with: Tape Dental Injury: Teeth and Oropharynx as per pre-operative assessment

## 2020-10-11 ENCOUNTER — Encounter (HOSPITAL_COMMUNITY): Payer: Self-pay | Admitting: General Surgery

## 2020-10-17 ENCOUNTER — Other Ambulatory Visit: Payer: Self-pay | Admitting: Internal Medicine

## 2020-10-19 ENCOUNTER — Other Ambulatory Visit: Payer: Self-pay

## 2020-10-19 ENCOUNTER — Telehealth: Payer: Self-pay | Admitting: Internal Medicine

## 2020-10-19 ENCOUNTER — Ambulatory Visit (INDEPENDENT_AMBULATORY_CARE_PROVIDER_SITE_OTHER): Payer: Medicare Other | Admitting: Internal Medicine

## 2020-10-19 VITALS — BP 103/69 | HR 55 | Temp 97.0°F | Ht 71.0 in | Wt 240.6 lb

## 2020-10-19 DIAGNOSIS — I10 Essential (primary) hypertension: Secondary | ICD-10-CM

## 2020-10-19 DIAGNOSIS — R911 Solitary pulmonary nodule: Secondary | ICD-10-CM

## 2020-10-19 DIAGNOSIS — R296 Repeated falls: Secondary | ICD-10-CM | POA: Diagnosis not present

## 2020-10-19 LAB — BASIC METABOLIC PANEL
BUN: 15 mg/dL (ref 6–23)
CO2: 31 mEq/L (ref 19–32)
Calcium: 9 mg/dL (ref 8.4–10.5)
Chloride: 94 mEq/L — ABNORMAL LOW (ref 96–112)
Creatinine, Ser: 1.06 mg/dL (ref 0.40–1.50)
GFR: 68.49 mL/min (ref >60.00)
Glucose, Bld: 92 mg/dL (ref 70–99)
Potassium: 4.2 mEq/L (ref 3.5–5.1)
Sodium: 131 mEq/L — ABNORMAL LOW (ref 135–145)

## 2020-10-19 MED ORDER — METOPROLOL TARTRATE 25 MG PO TABS: 12.5000 mg | ORAL_TABLET | Freq: Two times a day (BID) | ORAL | 1 refills | Status: DC

## 2020-10-19 NOTE — Patient Instructions (Signed)
Check the  blood pressure regularly  BP GOAL is between 110/65 and  135/85. If it is consistently higher or lower, let me know      GO TO THE LAB : Get the blood work     [place], [place] Come back for   physical exam by November 2022

## 2020-10-19 NOTE — Telephone Encounter (Signed)
Rx sent 

## 2020-10-19 NOTE — Assessment & Plan Note (Signed)
Prediabetes: Last A1c very good [name] well controlled on Lotensin, diltiazem, HCTZ, metoprolol.  Last sodium was a slightly low, check a BMP. Paroxysmal A. fib: Saw cardiology 06/19/2020, metoprolol dose decreased due to relative bradycardia. Frequent falls: See last visit, no further falls Pulm Nodule: Incidental 3 mm noted on abd CT.  Lung ca risk: Smoked very little in college, no FH of lung cancer, he was in the [employer] at some point, limited asbestos exposure?.  We agreed to revisit the issue by 07/2021, follow-up CT?  Overall, I think she is low risk Preventive care: Patient will bring the POA, remind him about Shingrix RTC 05-2021 CPX
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
## 2020-10-19 NOTE — Telephone Encounter (Signed)
Wife states pharmacy does not have medication  metoprolol tartrate (LOPRESSOR) 25 MG tablet [[id number]]    [facility] [place], [place] - [address]  [place], [place] [address]  Phone:  [phone] Fax:  [phone]  DEA #:  [id number]

## 2020-11-28 ENCOUNTER — Ambulatory Visit: Payer: Medicare Other | Admitting: Internal Medicine

## 2020-12-07 ENCOUNTER — Ambulatory Visit: Payer: Medicare Other | Admitting: Internal Medicine

## 2020-12-09 ENCOUNTER — Other Ambulatory Visit: Payer: Self-pay | Admitting: Internal Medicine
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
## 2021-01-02 ENCOUNTER — Encounter: Payer: Self-pay | Admitting: Cardiovascular Disease

## 2021-01-02 ENCOUNTER — Ambulatory Visit: Payer: Medicare Other | Admitting: Cardiovascular Disease

## 2021-01-02 ENCOUNTER — Other Ambulatory Visit: Payer: Self-pay

## 2021-01-02 VITALS — BP 112/82 | HR 69 | Ht 71.0 in | Wt 236.4 lb

## 2021-01-02 DIAGNOSIS — I480 Paroxysmal atrial fibrillation: Secondary | ICD-10-CM

## 2021-01-02 DIAGNOSIS — Z7901 Long term (current) use of anticoagulants: Secondary | ICD-10-CM | POA: Diagnosis not present

## 2021-01-02 DIAGNOSIS — I10 Essential (primary) hypertension: Secondary | ICD-10-CM

## 2021-01-02 MED ORDER — FLECAINIDE ACETATE 100 MG PO TABS: ORAL_TABLET | ORAL | 0 refills | Status: DC

## 2021-01-02 NOTE — Patient Instructions (Signed)

## 2021-01-02 NOTE — Addendum Note (Signed)
Addended by: Aris Georgia, Tayvon Culley L on: 01/02/2021 03:22 PM   Modules accepted: Orders

## 2021-01-04 ENCOUNTER — Other Ambulatory Visit: Payer: Self-pay

## 2021-01-04 MED ORDER — DILTIAZEM HCL ER 180 MG PO CP24: ORAL_CAPSULE | ORAL | 1 refills | Status: DC

## 2021-01-11 ENCOUNTER — Other Ambulatory Visit: Payer: Self-pay | Admitting: Internal Medicine

## 2021-02-19 ENCOUNTER — Other Ambulatory Visit: Payer: Self-pay | Admitting: Cardiovascular Disease

## 2021-02-20 NOTE — Telephone Encounter (Signed)
Prescription refill request for Eliquis received. Indication: Afib Last office visit: 01/02/21 Johnsie Cancel)  Scr: 1.06( 10/19/20) Age: 75 Weight: 107.2kg  Appropriate dose and refill sent to requested pharmacy.

## 2021-02-25 ENCOUNTER — Other Ambulatory Visit: Payer: Self-pay | Admitting: Internal Medicine

## 2021-04-12 ENCOUNTER — Other Ambulatory Visit: Payer: Self-pay | Admitting: Internal Medicine

## 2021-04-16 ENCOUNTER — Telehealth: Payer: Self-pay | Admitting: Internal Medicine

## 2021-04-16 NOTE — Telephone Encounter (Signed)
Left message for patient to call back and schedule Medicare Annual Wellness Visit (AWV) in office.   If not able to come in office, please offer to do virtually or by telephone.  Left office number and my jabber [phone].  Last AWV:02/28/2015  Please schedule at anytime with Nurse Health Advisor.

## 2021-05-28 ENCOUNTER — Encounter: Payer: Medicare Other | Admitting: Internal Medicine

## 2021-06-04 ENCOUNTER — Ambulatory Visit (INDEPENDENT_AMBULATORY_CARE_PROVIDER_SITE_OTHER): Payer: Medicare Other | Admitting: Internal Medicine

## 2021-06-04 ENCOUNTER — Encounter: Payer: Self-pay | Admitting: Internal Medicine

## 2021-06-04 ENCOUNTER — Other Ambulatory Visit: Payer: Self-pay

## 2021-06-04 VITALS — BP 122/80 | HR 57 | Temp 98.2°F | Resp 18 | Ht 71.0 in | Wt 230.5 lb

## 2021-06-04 DIAGNOSIS — R739 Hyperglycemia, unspecified: Secondary | ICD-10-CM | POA: Diagnosis not present

## 2021-06-04 DIAGNOSIS — I10 Essential (primary) hypertension: Secondary | ICD-10-CM | POA: Diagnosis not present

## 2021-06-04 DIAGNOSIS — Z0000 Encounter for general adult medical examination without abnormal findings: Secondary | ICD-10-CM | POA: Diagnosis not present

## 2021-06-04 DIAGNOSIS — E785 Hyperlipidemia, unspecified: Secondary | ICD-10-CM | POA: Diagnosis not present

## 2021-06-04 MED ORDER — APIXABAN 5 MG PO TABS: 5.0000 mg | ORAL_TABLET | Freq: Two times a day (BID) | ORAL | 0 refills | Status: DC
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
## 2021-06-04 NOTE — Patient Instructions (Signed)
Check the  blood pressure regularly BP GOAL is between 110/65 and  135/85. If it is consistently higher or lower, let me know    GO TO THE LAB : Get the blood work     GO TO THE FRONT DESK, PLEASE SCHEDULE YOUR APPOINTMENTS Come back for   checkup in 6 months 

## 2021-06-05 ENCOUNTER — Encounter: Payer: Self-pay | Admitting: Internal Medicine

## 2021-06-05 LAB — COMPREHENSIVE METABOLIC PANEL
ALT: 10 U/L (ref 0–53)
AST: 14 U/L (ref 0–37)
Albumin: 4.2 g/dL (ref 3.5–5.2)
Alkaline Phosphatase: 52 U/L (ref 39–117)
BUN: 15 mg/dL (ref 6–23)
CO2: 27 mEq/L (ref 19–32)
Calcium: 9.1 mg/dL (ref 8.4–10.5)
Chloride: 102 mEq/L (ref 96–112)
Creatinine, Ser: 1.1 mg/dL (ref 0.40–1.50)
GFR: 65.23 mL/min (ref >60.00)
Glucose, Bld: 85 mg/dL (ref 70–99)
Potassium: 4.1 mEq/L (ref 3.5–5.1)
Sodium: 137 mEq/L (ref 135–145)
Total Bilirubin: 0.9 mg/dL (ref 0.2–1.2)
Total Protein: 6.7 g/dL (ref 6.0–8.3)

## 2021-06-05 LAB — CBC WITH DIFFERENTIAL/PLATELET
Basophils Absolute: 0 10*3/uL (ref 0.0–0.1)
Basophils Relative: 0.6 % (ref 0.0–3.0)
Eosinophils Absolute: 0.2 10*3/uL (ref 0.0–0.7)
Eosinophils Relative: 3.5 % (ref 0.0–5.0)
HCT: 42.5 % (ref 39.0–52.0)
Hemoglobin: 14.2 g/dL (ref 13.0–17.0)
Lymphocytes Relative: 29.2 % (ref 12.0–46.0)
Lymphs Abs: 1.9 10*3/uL (ref 0.7–4.0)
MCHC: 33.3 g/dL (ref 30.0–36.0)
MCV: 92.1 fl (ref 78.0–100.0)
Monocytes Absolute: 0.7 10*3/uL (ref 0.1–1.0)
Monocytes Relative: 10.1 % (ref 3.0–12.0)
Neutro Abs: 3.7 10*3/uL (ref 1.4–7.7)
Neutrophils Relative %: 56.6 % (ref 43.0–77.0)
Platelets: 235 10*3/uL (ref 150.0–400.0)
RBC: 4.62 Mil/uL (ref 4.22–5.81)
RDW: 13.3 % (ref 11.5–15.5)
WBC: 6.5 10*3/uL (ref 4.0–10.5)

## 2021-06-05 LAB — PSA: PSA: 1.64 ng/mL (ref 0.10–4.00)

## 2021-06-05 LAB — HEMOGLOBIN A1C: Hgb A1c MFr Bld: 5.8 % (ref 4.6–6.5)

## 2021-06-05 LAB — LIPID PANEL
Cholesterol: 135 mg/dL (ref 0–200)
HDL: 42.3 mg/dL (ref >39.00)
LDL Cholesterol: 75 mg/dL (ref 0–99)
NonHDL: 92.73
Total CHOL/HDL Ratio: 3
Triglycerides: 90 mg/dL (ref 0.0–149.0)
VLDL: 18 mg/dL (ref 0.0–40.0)
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
## 2021-06-05 NOTE — Assessment & Plan Note (Signed)
-   Td 2020  - PNM 2013;  Prevnar 08-2014 -  had a zostavax and shingrix x 2 - had covid vax x 5 (UTD) - s/p flu shot -Prostate cancer screening: DRE today showing a minute extraprostatic nodule on the right, similar to previous years.  Checking a PSA. -CCS: colonoscopy 05-2014, 3 polyps, cscope again 05-2017, next 5 years per GI. - Diet: Discussed - labs: CMP FLP CBC PSA A1C -Advance care planning package provided

## 2021-07-15 ENCOUNTER — Other Ambulatory Visit: Payer: Self-pay | Admitting: Internal Medicine

## 2021-07-19 ENCOUNTER — Other Ambulatory Visit: Payer: Self-pay | Admitting: Internal Medicine

## 2021-08-07 ENCOUNTER — Encounter: Payer: Self-pay | Admitting: Internal Medicine

## 2021-08-08 MED ORDER — OMEPRAZOLE 20 MG PO CPDR: 20.0000 mg | DELAYED_RELEASE_CAPSULE | Freq: Every day | ORAL | 1 refills | Status: DC | PRN

## 2021-09-26 IMAGING — CT CT ABD-PELV W/O CM
1 of 2 series · 14 of 32 positions shown, 19 images · non-contrast
Comparison: None.

CLINICAL DATA: Right lower quadrant palpable mass, increased in
size over past 6 months.

EXAM:
CT ABDOMEN AND PELVIS WITHOUT CONTRAST
TECHNIQUE: Multidetector CT imaging of the abdomen and pelvis was performed
following the standard protocol without IV contrast.

[Series 2: abd/pelvis w/(date) · axial · 0.86mm/px · z∈[-483,-33]mm · 14 of 102 slices shown, 19 images]
[im 6/102  soft-tissue]
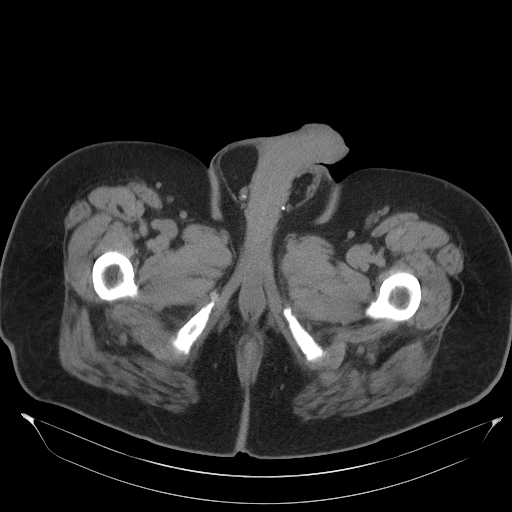
[im 6/102  bone]
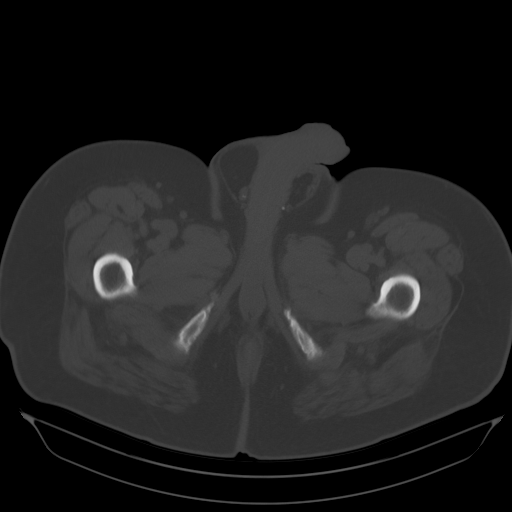
[im 12/102  soft-tissue]
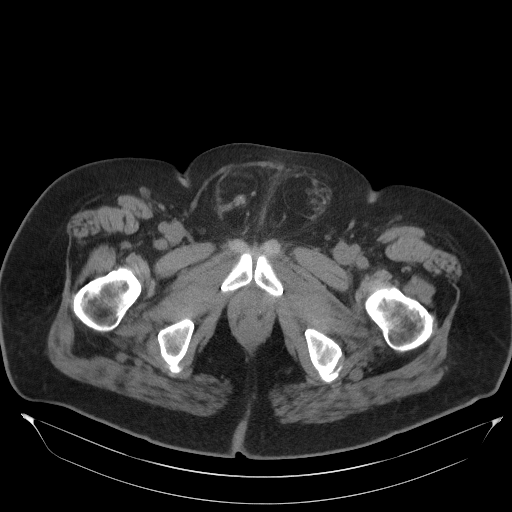
[im 24/102  soft-tissue]
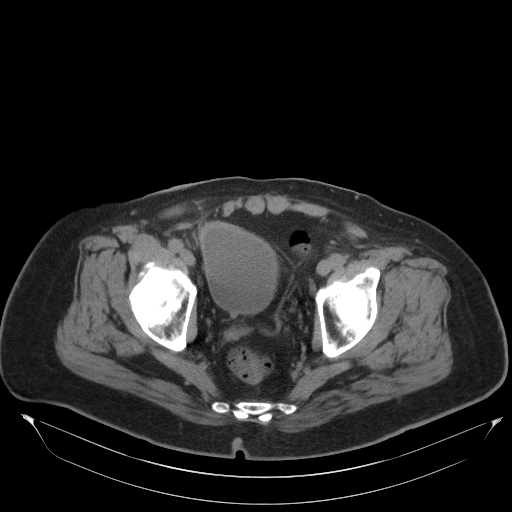
[im 30/102  soft-tissue]
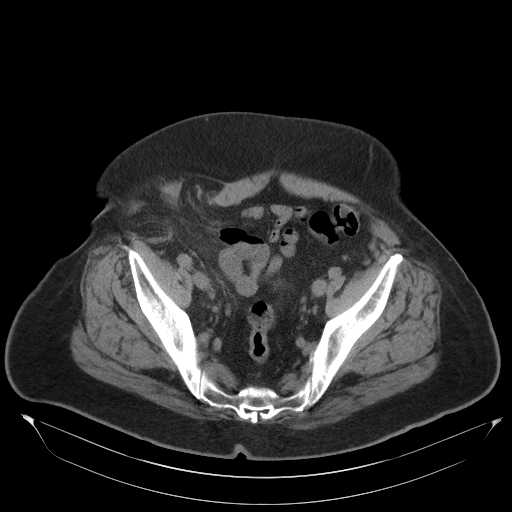
[im 36/102  soft-tissue]
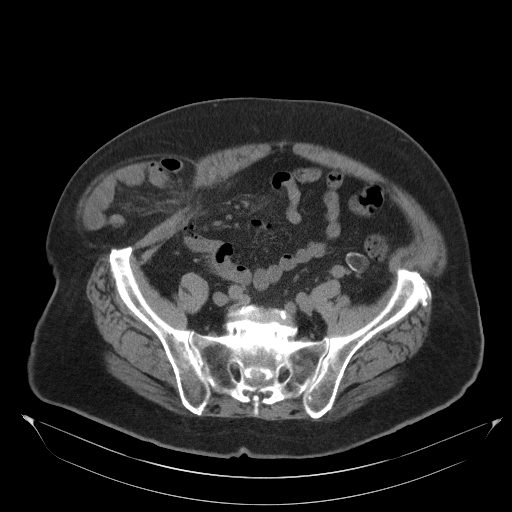
[im 42/102  soft-tissue]
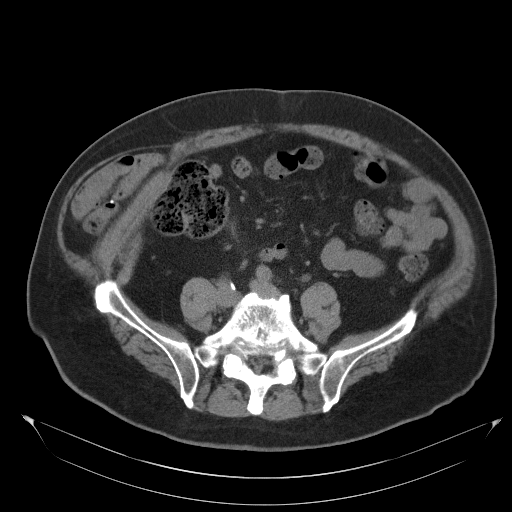
[im 54/102  soft-tissue]
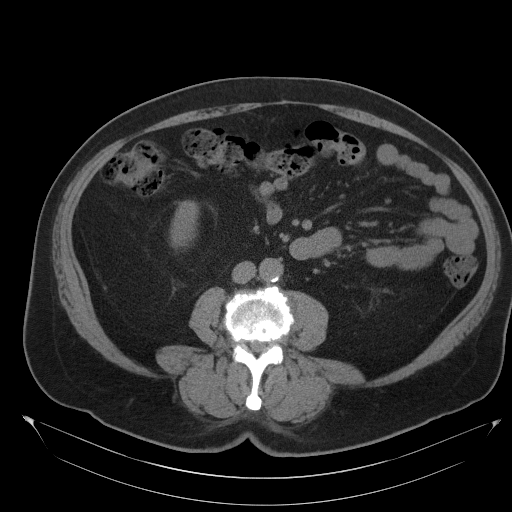
[im 60/102  soft-tissue]
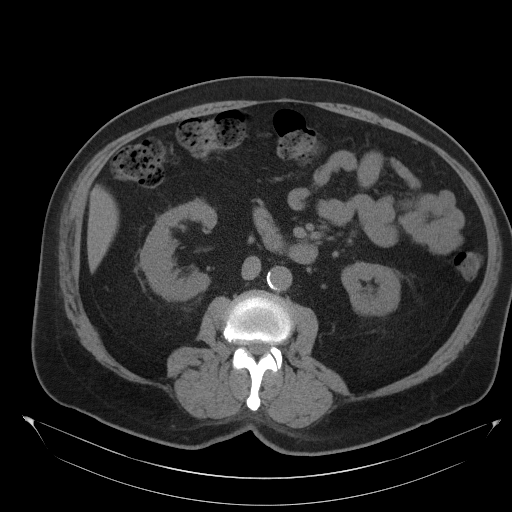
[im 66/102  soft-tissue]
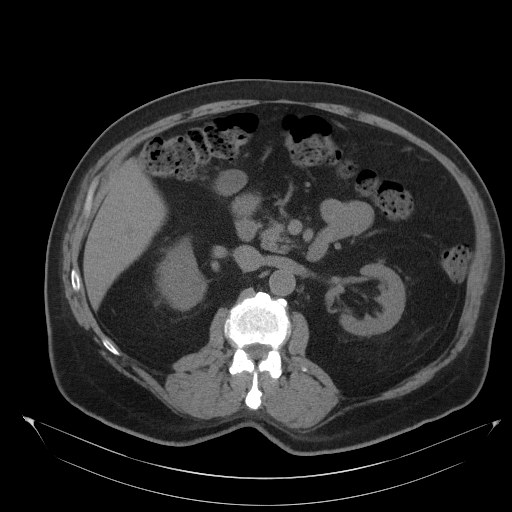
[im 66/102  bone]
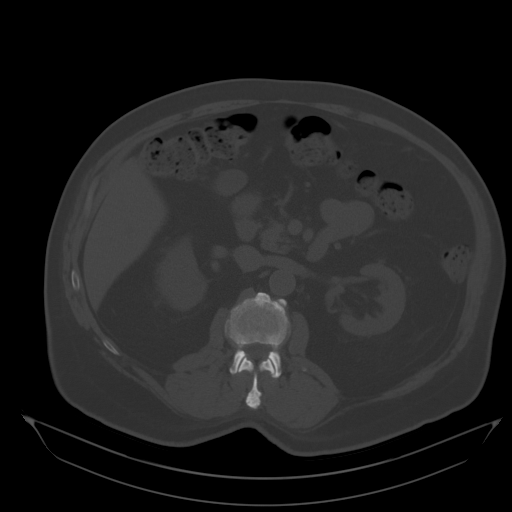
[im 72/102  soft-tissue]
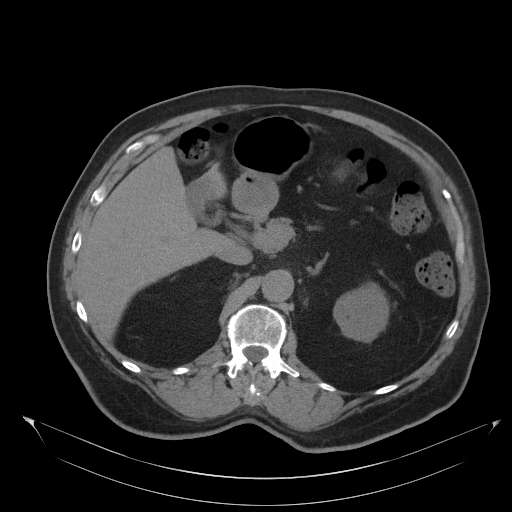
[im 78/102  soft-tissue]
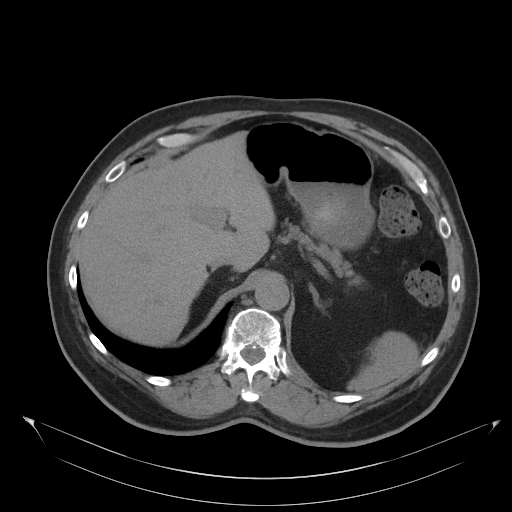
[im 78/102  lung]
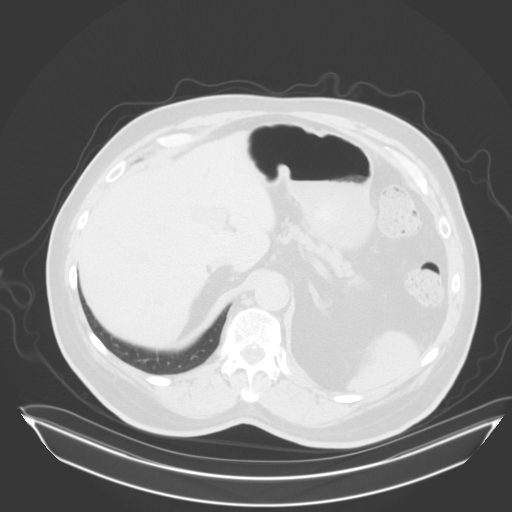
[im 84/102  lung]
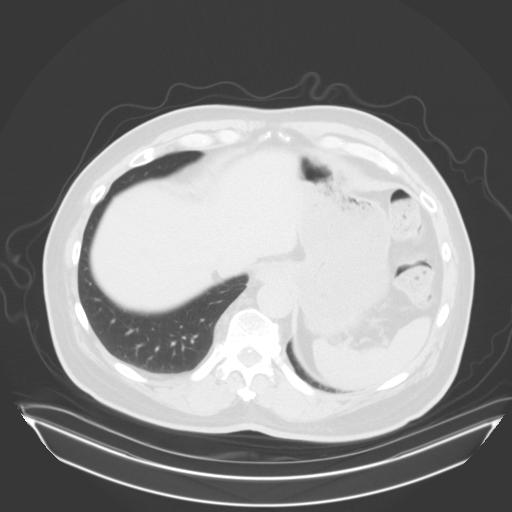
[im 90/102  soft-tissue]
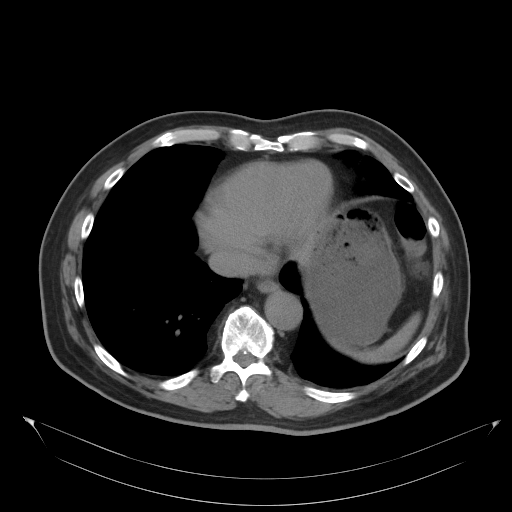
[im 90/102  lung]
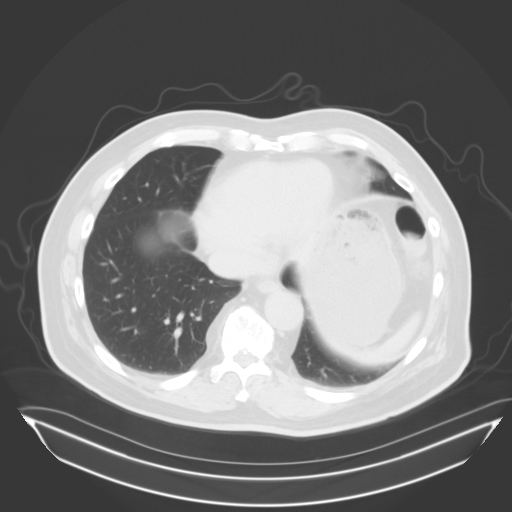
[im 96/102  soft-tissue]
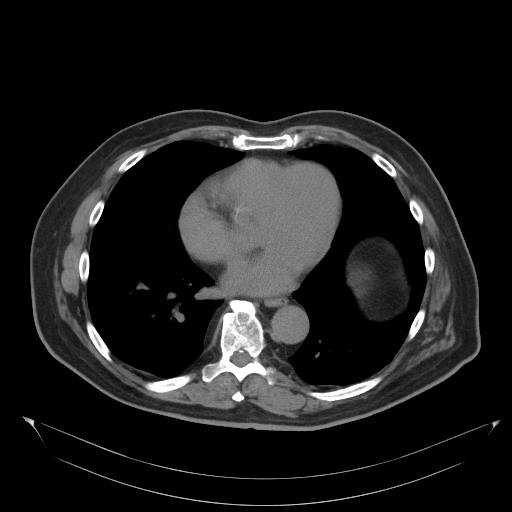
[im 96/102  lung]
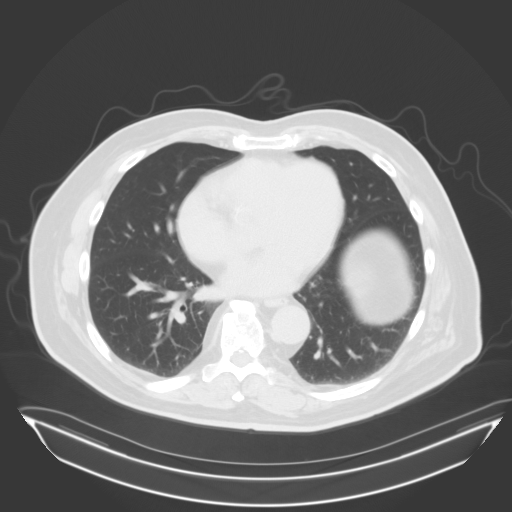

[14 of 32 positions shown; findings below may reference images not displayed]

FINDINGS: Lower chest: 3 mm pleural-based nodule is seen in the lateral right
lower lobe on image 36/5.

Hepatobiliary: No mass visualized on this unenhanced exam.
Gallbladder is unremarkable. No evidence of biliary ductal
dilatation.

Pancreas: No mass or inflammatory process visualized on this
unenhanced exam.

Spleen:  Within normal limits in size.

Adrenals/Urinary tract: No evidence of urolithiasis or
hydronephrosis.

Stomach/Bowel: No evidence of obstruction, inflammatory process, or
abnormal fluid collections. A large right lower quadrant ventral
abdominal wall hernia is seen containing multiple small bowel loops
and a small portion of the urinary bladder. No evidence of bowel
obstruction or wall thickening. No evidence of inflammatory process
or abnormal fluid collections.

Vascular/Lymphatic: No pathologically enlarged lymph nodes
identified. No evidence of abdominal aortic aneurysm.

Reproductive:  No mass or other significant abnormality.

Other:  None.

Musculoskeletal:  No suspicious bone lesions identified.
IMPRESSION: Large right lower quadrant ventral abdominal wall hernia containing
multiple small bowel loops and a small portion of the urinary
bladder. No evidence of bowel obstruction or other acute findings.

3 mm indeterminate pulmonary nodule in the lateral right lower lobe.
No follow-up needed if patient is low-risk. Non-contrast chest CT
can be considered in 12 months if patient is high-risk. This
recommendation follows the consensus statement: Guidelines for
Management of Incidental Pulmonary Nodules Detected on CT Images:

## 2021-10-01 ENCOUNTER — Other Ambulatory Visit: Payer: Self-pay | Admitting: Cardiovascular Disease

## 2021-10-01 NOTE — Telephone Encounter (Signed)
Pt last saw Dr Johnsie Cancel 01/02/21, last labs 06/04/21 Creat 1.10, age 76, weight 104.6kg, based on specified criteria pt is on appropriate dosage of Eliquis '5mg'$  BID for afib.  Will refill rx.  ?

## 2021-10-09 ENCOUNTER — Other Ambulatory Visit: Payer: Self-pay | Admitting: Internal Medicine

## 2021-12-03 ENCOUNTER — Ambulatory Visit: Payer: Medicare Other | Admitting: Internal Medicine

## 2021-12-07 ENCOUNTER — Ambulatory Visit (INDEPENDENT_AMBULATORY_CARE_PROVIDER_SITE_OTHER): Payer: Medicare Other | Admitting: Internal Medicine

## 2021-12-07 ENCOUNTER — Encounter: Payer: Self-pay | Admitting: Internal Medicine

## 2021-12-07 VITALS — BP 116/68 | HR 52 | Temp 98.2°F | Resp 16 | Ht 71.0 in | Wt 230.2 lb

## 2021-12-07 DIAGNOSIS — R911 Solitary pulmonary nodule: Secondary | ICD-10-CM | POA: Diagnosis not present

## 2021-12-07 DIAGNOSIS — I10 Essential (primary) hypertension: Secondary | ICD-10-CM | POA: Diagnosis not present

## 2021-12-07 NOTE — Patient Instructions (Signed)
Continue checking your blood pressure regularly BP GOAL is between 110/65 and  135/85. If it is consistently higher or lower, let me know    GO TO THE LAB : Get the blood work     [place], [place] back for a physical exam in 6 months
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
## 2021-12-08 LAB — BASIC METABOLIC PANEL
BUN: 16 mg/dL (ref 7–25)
CO2: 27 mmol/L (ref 20–32)
Calcium: 9.2 mg/dL (ref 8.6–10.3)
Chloride: 99 mmol/L (ref 98–110)
Creat: 1.05 mg/dL (ref 0.70–1.28)
Glucose, Bld: 88 mg/dL (ref 65–99)
Potassium: 4.1 mmol/L (ref 3.5–5.3)
Sodium: 136 mmol/L (ref 135–146)

## 2021-12-08 LAB — CBC WITH DIFFERENTIAL/PLATELET
Absolute Monocytes: 930 cells/uL (ref 200–950)
Basophils Absolute: 50 cells/uL (ref 0–200)
Basophils Relative: 0.6 %
Eosinophils Absolute: 232 cells/uL (ref 15–500)
Eosinophils Relative: 2.8 %
HCT: 41.9 % (ref 38.5–50.0)
Hemoglobin: 14.3 g/dL (ref 13.2–17.1)
Lymphs Abs: 1992 cells/uL (ref 850–3900)
MCH: 31.6 pg (ref 27.0–33.0)
MCHC: 34.1 g/dL (ref 32.0–36.0)
MCV: 92.5 fL (ref 80.0–100.0)
MPV: 9.9 fL (ref 7.5–12.5)
Monocytes Relative: 11.2 %
Neutro Abs: 5096 cells/uL (ref 1500–7800)
Neutrophils Relative %: 61.4 %
Platelets: 308 10*3/uL (ref 140–400)
RBC: 4.53 10*6/uL (ref 4.20–5.80)
RDW: 12.1 % (ref 11.0–15.0)
Total Lymphocyte: 24 %
WBC: 8.3 10*3/uL (ref 3.8–10.8)

## 2021-12-08 NOTE — Assessment & Plan Note (Signed)
Prediabetes: Last A1c very good HTN: Ambulatory BPs 120/70, Today he reports he has been taking HCTZ for the last year and a half. He also takes Lotensin, diltiazem, metoprolol.  Check a BMP and CBC Paroxysmal A-fib: Anticoagulated without apparent problems. Abnormal CT chest January 2022: CT abdomen showed pulmonary nodule, 3 mm.  He is asx, not a smoker.  Recheck CT. RTC CPX 6 months

## 2021-12-10 ENCOUNTER — Other Ambulatory Visit: Payer: Self-pay | Admitting: Internal Medicine

## 2021-12-14 ENCOUNTER — Other Ambulatory Visit (HOSPITAL_BASED_OUTPATIENT_CLINIC_OR_DEPARTMENT_OTHER): Payer: Medicare Other

## 2021-12-19 ENCOUNTER — Ambulatory Visit (HOSPITAL_BASED_OUTPATIENT_CLINIC_OR_DEPARTMENT_OTHER)
Admission: RE | Admit: 2021-12-19 | Discharge: 2021-12-19 | Disposition: A | Discharge status: Home / Self Care | Payer: Medicare Other | Source: Ambulatory Visit | Attending: Internal Medicine | Admitting: Internal Medicine

## 2021-12-19 DIAGNOSIS — R911 Solitary pulmonary nodule: Secondary | ICD-10-CM | POA: Diagnosis not present

## 2022-01-04 ENCOUNTER — Other Ambulatory Visit: Payer: Self-pay | Admitting: Internal Medicine

## 2022-01-07 ENCOUNTER — Other Ambulatory Visit: Payer: Self-pay | Admitting: Internal Medicine

## 2022-01-09 NOTE — Progress Notes (Signed)
Office Visit    Patient Name: Rodney Sawyer Date of Encounter: 01/14/2022  PCP:  Colon Branch, [place]  Cardiologist:  Jenkins Rouge, MD  Advanced Practice Provider:  No care team member to display Electrophysiologist:  None    HPI    Rodney Sawyer is a 76 y.o. male with a hx of PAF (beginning in 2017) CRF, HTN, HLD presents today for annual follow-up.  He was last seen by Dr. Johnsie Cancel 01/02/2021 and was doing well at that time.  Since he tends to be bradycardic his Lopressor was reduced to 25 mg twice daily.  He remains on flecainide and Eliquis for his atrial fibrillation.  Today, he feels pretty good.  Denies chest pain and shortness of breath.  He states that he had to use his flecainide once this whole past year.  He used it to months ago and he converted quickly out of atrial fibrillation.  He questions when he should go to the ED if needed.  We discussed if the flecainide did not work within 1 to 2 hours that he would need to head to the ED.  Luckily, he has had good luck with his flecainide.  We have provided him a fresh prescription since his current prescription is out of date.  He has been on Eliquis without any significant bleeding other than some minor bruising of his forearms.  At home his blood pressure has been 109N to 235T systolic over 73U diastolic.  He has stopped drinking caffeine and alcohol and this is significantly helped his heart rhythm.  He does have some lower extremity edema that is chronic for him and it does not bother him.  We discussed elevating his feet when he is in a seated position and using TED hose when able.   Reports no shortness of breath nor dyspnea on exertion. Reports no chest pain, pressure, or tightness. No edema, orthopnea, PND.      Past Medical History    Past Medical History:  Diagnosis Date   AF (paroxysmal atrial fibrillation) ([place])    dx ~ 2006   Depression    GERD (gastroesophageal reflux disease)     occasionally   Hepatitis A 1960   containmented water at school when he was a child   Hyperlipidemia    Hypertension    Seborrheic keratosis 12/2015   R midline upper back   Shingles 12-2013   Skin cancer    Basal cell   Past Surgical History:  Procedure Laterality Date   ANTERIOR CERVICAL DECOMP/DISCECTOMY FUSION N/A 03/13/2013   Procedure: ANTERIOR CERVICAL DECOMPRESSION/DISCECTOMY FUSION. Cervical four-five, Cervical five-six;  Surgeon: Otilio Connors, MD;  Location: [facility] NEURO ORS;  Service: Neurosurgery;  Laterality: N/A;   COLONOSCOPY     COLONOSCOPY W/ POLYPECTOMY  2001   negative 2006;[facility]   CYSTOSCOPY  2004   Negative; done for microscopic hematuria, asymptomatic   INSERTION OF MESH N/A 10/10/2020   Procedure: INSERTION OF MESH;  Surgeon: Ralene Ok, MD;  Location: [place];  Service: General;  Laterality: N/A;   POLYPECTOMY     TONSILLECTOMY AND ADENOIDECTOMY     VASECTOMY     XI ROBOTIC ASSISTED VENTRAL HERNIA N/A 10/10/2020   Procedure: XI ROBOTIC ASSISTED VENTRAL HERNIA REPAIR WITH MESH;  Surgeon: Ralene Ok, MD;  Location: [place];  Service: General;  Laterality: N/A;    Allergies  No Known Allergies  EKGs/Labs/Other Studies Reviewed:   The  following studies were reviewed today:  Echocardiogram 08/04/2014 Study Conclusions   - Left ventricle: The cavity size was normal. Wall thickness was    increased in a pattern of mild LVH. Systolic function was normal.    The estimated ejection fraction was in the range of 50% to 55%.    Wall motion was normal; there were no regional wall motion    abnormalities. Doppler parameters are consistent with abnormal    left ventricular relaxation (grade 1 diastolic dysfunction).  - Aortic valve: There was trivial regurgitation.  - Ascending aorta: The ascending aorta was mildly dilated.  - Left atrium: The atrium was mildly dilated.   Impressions:   - Normal LV function; mild LVH; grade 1 diastolic dysfunction;  mild    LAE; trace MR, TR and AI.   EKG:  EKG is  ordered today.  The ekg ordered today demonstrates NSR rate 63 bpm, PVC  Recent Labs: 06/04/2021: ALT 10 12/07/2021: BUN 16; Creat 1.05; Hemoglobin 14.3; Platelets 308; Potassium 4.1; Sodium 136  Recent Lipid Panel    Component Value Date/Time   CHOL 135 06/04/2021 1440   CHOL 156 01/04/2014 1204   TRIG 90.0 06/04/2021 1440   TRIG 182 (H) 01/04/2014 1204   HDL 42.30 06/04/2021 1440   HDL 37 (L) 01/04/2014 1204   CHOLHDL 3 06/04/2021 1440   VLDL 18.0 06/04/2021 1440   LDLCALC 75 06/04/2021 1440   LDLCALC 83 01/04/2014 1204    Risk Assessment/Calculations:   CHA2DS2-VASc Score = 3   This indicates a 3.2% annual risk of stroke. The patient's score is based upon: CHF History: 0 HTN History: 1 Diabetes History: 0 Stroke History: 0 Vascular Disease History: 0 Age Score: 2 Gender Score: 0     Home Medications   Current Meds  Medication Sig   benazepril (LOTENSIN) 20 MG tablet TAKE 1 TABLET(20 MG) BY MOUTH TWICE DAILY   diltiazem (DILT-XR) 180 MG 24 hr capsule TAKE ONE CAPSULE BY MOUTH TWICE DAILY   docusate sodium (COLACE) 100 MG capsule Take 100 mg by mouth daily as needed for mild constipation.   hydrochlorothiazide (HYDRODIURIL) 25 MG tablet TAKE 1 TABLET(25 MG) BY MOUTH DAILY   metoprolol tartrate (LOPRESSOR) 25 MG tablet TAKE 1/2 TABLET(12.5 MG) BY MOUTH TWICE DAILY   Omega-3 Fatty Acids (FISH OIL) 1200 MG CAPS Take 1,200 mg by mouth daily.   rosuvastatin (CRESTOR) 20 MG tablet Take 20 mg by mouth every Monday, Wednesday, and Friday.   [DISCONTINUED] apixaban (ELIQUIS) 5 MG TABS tablet TAKE 1 TABLET BY MOUTH TWICE DAILY   [DISCONTINUED] flecainide (TAMBOCOR) 100 MG tablet TAKE 3 TABLETS('300MG'$  TOTAL) BY MOUTH AS NEEDED FOR EPISODE OF AFIB AS DIRECTED   [DISCONTINUED] omeprazole (PRILOSEC) 20 MG capsule Take 1 capsule (20 mg total) by mouth daily as needed.     Review of Systems      All other systems reviewed and  are otherwise negative except as noted above.  Physical Exam    VS:  BP 118/68   Pulse 63   Ht '5\' 11"'$  (1.803 m)   Wt 226 lb (102.5 kg)   SpO2 98%   BMI 31.52 kg/m  , BMI Body mass index is 31.52 kg/m.  Wt Readings from Last 3 Encounters:  01/14/22 226 lb (102.5 kg)  12/07/21 230 lb 4 oz (104.4 kg)  06/04/21 230 lb 8 oz (104.6 kg)     GEN: Well nourished, well developed, in no acute distress. HEENT: normal. Neck: Supple, no JVD,  carotid bruits, or masses. Cardiac: RRR, no murmurs, rubs, or gallops. No clubbing, cyanosis, edema.  Radials/PT 2+ and equal bilaterally.  Respiratory:  Respirations regular and unlabored, clear to auscultation bilaterally. GI: Soft, nontender, nondistended. MS: No deformity or atrophy. Skin: Warm and dry, no rash. Neuro:  Strength and sensation are intact. Psych: Normal affect.  Assessment & Plan    PAF -flecainide as needed for Afib episode -two months ago, took Flecainide once and that was all he needed this past year.  -Continue Eliquis 5 mg twice a day.  No bleeding on anticoagulation. -He has eliminated alcohol and caffeine from his diet.  This has seemed to help tremendously. -Continue Lopressor 12.5 mg twice daily  Hypertension -Well-controlled today in the office -Well-controlled at home per patient -Continue HCTZ 25 mg daily and Lotensin 20 mg twice a day for blood pressure control  Hyperlipidemia -LDL 75, 06/04/21 -update Lipid panel (this is done by his PCP) -Continue Crestor 20 mg Monday, Wednesday, Friday     Disposition: Follow up  1 year  with Jenkins Rouge, MD or APP.  Signed, Elgie Collard, PA-C 01/14/2022, 4:26 PM [facility]

## 2022-01-14 ENCOUNTER — Ambulatory Visit: Payer: Medicare Other | Admitting: Physician Assistant

## 2022-01-14 ENCOUNTER — Encounter: Payer: Self-pay | Admitting: Physician Assistant

## 2022-01-14 VITALS — BP 118/68 | HR 63 | Ht 71.0 in | Wt 226.0 lb

## 2022-01-14 DIAGNOSIS — Z7901 Long term (current) use of anticoagulants: Secondary | ICD-10-CM | POA: Diagnosis not present

## 2022-01-14 DIAGNOSIS — I480 Paroxysmal atrial fibrillation: Secondary | ICD-10-CM

## 2022-01-14 DIAGNOSIS — R001 Bradycardia, unspecified: Secondary | ICD-10-CM | POA: Diagnosis not present

## 2022-01-14 MED ORDER — APIXABAN 5 MG PO TABS: 5.0000 mg | ORAL_TABLET | Freq: Two times a day (BID) | ORAL | 3 refills | Status: DC

## 2022-01-14 MED ORDER — FLECAINIDE ACETATE 100 MG PO TABS: ORAL_TABLET | ORAL | 0 refills | Status: DC

## 2022-01-14 NOTE — Patient Instructions (Signed)
Medication Instructions:  Your physician recommends that you continue on your current medications as directed. Please refer to the Current Medication list given to you today.  *If you need a refill on your cardiac medications before your next appointment, please call your pharmacy*   Lab Work: None If you have labs (blood work) drawn today and your tests are completely normal, you will receive your results only by: [place] (if you have MyChart) OR A paper copy in the mail If you have any lab test that is abnormal or we need to change your treatment, we will call you to review the results.  Follow-Up: At [facility], you and your health needs are our priority.  As part of our continuing mission to provide you with exceptional heart care, we have created designated Provider Care Teams.  These Care Teams include your primary Cardiologist (physician) and Advanced Practice Providers (APPs -  Physician Assistants and Nurse Practitioners) who all work together to provide you with the care you need, when you need it.  Your next appointment:   1 year(s)  The format for your next appointment:   In Person  Provider:   Jenkins Rouge, MD {  Important Information About Sugar

## 2022-02-15 ENCOUNTER — Telehealth: Payer: Self-pay | Admitting: Internal Medicine

## 2022-02-15 NOTE — Telephone Encounter (Signed)
Nurse Assessment Nurse: Ellery Plunk, RN, Danica Date/Time (Eastern Time): 02/15/2022 11:47:56 AM Confirm and document reason for call. If symptomatic, describe symptoms. ---Caller states he has been feeling lightheaded and having heart palpitations. BP is 110/60, normally BP is 120s/70s Does the patient have any new or worsening symptoms? ---Yes Will a triage be completed? ---Yes Related visit to physician within the last 2 weeks? ---No Does the PT have any chronic conditions? (i.e. diabetes, asthma, this includes High risk factors for pregnancy, etc.) ---Yes List chronic conditions. ---htn, paradoxical a. fib (has an a fib attack once a year) Is this a behavioral health or substance abuse call? ---No Guidelines Guideline Title Affirmed Question Affirmed Notes Nurse Date/Time (Eastern Time) Heart Rate and Heartbeat Questions Dizziness, lightheadedness, or weakness Bringas, RN, Danica 02/15/2022 11:49:42 AM Disp. Time Eilene Ghazi Time) Disposition Final User 02/15/2022 11:53:37 AM Go to ED Now Yes Bringas, RN, Danica PLEASE NOTE: All timestamps contained within this report are represented as [place] Standard Time. CONFIDENTIALTY NOTICE: This fax transmission is intended only for the addressee. It contains information that is legally privileged, confidential or otherwise protected from use or disclosure. If you are not the intended recipient, you are strictly prohibited from reviewing, disclosing, copying using or disseminating any of this information or taking any action in reliance on or regarding this information. If you have received this fax in error, please notify us immediately by telephone so that we can arrange for its return to [place]. Phone: [phone], Toll-Free: [phone], Fax: [phone] Page: 2 of 2 Call Id: [id number] Final Disposition 02/15/2022 11:53:37 AM Go to ED Now Yes Ellery Plunk, RN, Fredric Dine Caller Disagree/Comply Disagree Caller Understands Yes PreDisposition  InappropriateToAsk Care Advice Given Per Guideline GO TO ED NOW: * You need to be seen in the Emergency Department. * Leave now. Drive carefully. CARE ADVICE given per Heart Rate and Heartbeat Questions (Adult) guideline. Comments User: Harriette Bouillon, RN Date/Time Eilene Ghazi Time): 02/15/2022 11:51:31 AM caller states his HR is normal User: Harriette Bouillon, RN Date/Time Eilene Ghazi Time): 02/15/2022 11:54:45 AM caller states he does not want to go to ED. States he and his wife are on the road driving into [place]. States he will talk to his wife and might call back with further questions Referrals GO TO FACILITY REFUSED

## 2022-02-15 NOTE — Telephone Encounter (Signed)
FYI. Pt called regarding lightheadedness and low bp. He was recommended to go to ED by triage but informed that he and his wife are on the road in [place] right now and refused to go.

## 2022-02-15 NOTE — Telephone Encounter (Signed)
Pamala Hurry (spouse) called stating that pts BP has gone back to normal and dizziness has ceased. She also wanted to correct on the triage note that he was not having heart palpitations but normal "skips" that both his cardiologist and Dr. Larose Kells stated were normal given pt's medical history. Pamala Hurry stated that since this was the case, they did not feel the need for the pt to visit the ED. Advised to monitor pt for any other symptoms that were out of the norm and to seek ED treatment if he got worse. Pamala Hurry acknowledged understanding.

## 2022-02-15 NOTE — Telephone Encounter (Signed)
Agree with recommendation. If he refuses not much I can do except to advised to continue checking BPs, drink plenty of fluids and reconsider ER

## 2022-02-15 NOTE — Telephone Encounter (Signed)
Pt stated his bp has been fluctuating a lot. Today was 100/60 and he is lightheaded. He wanted to wait until the 9th to be seen. Triaged for nurse to go over sxs.

## 2022-02-20 ENCOUNTER — Ambulatory Visit: Payer: Medicare Other | Admitting: Internal Medicine

## 2022-02-20 ENCOUNTER — Ambulatory Visit (INDEPENDENT_AMBULATORY_CARE_PROVIDER_SITE_OTHER): Payer: Medicare Other | Admitting: Internal Medicine

## 2022-02-20 ENCOUNTER — Encounter: Payer: Self-pay | Admitting: Internal Medicine

## 2022-02-20 VITALS — BP 108/62 | HR 49 | Temp 98.2°F | Resp 18 | Ht 71.0 in | Wt 220.5 lb

## 2022-02-20 DIAGNOSIS — M47812 Spondylosis without myelopathy or radiculopathy, cervical region: Secondary | ICD-10-CM

## 2022-02-20 DIAGNOSIS — R269 Unspecified abnormalities of gait and mobility: Secondary | ICD-10-CM

## 2022-02-20 DIAGNOSIS — I10 Essential (primary) hypertension: Secondary | ICD-10-CM | POA: Diagnosis not present

## 2022-02-20 DIAGNOSIS — I480 Paroxysmal atrial fibrillation: Secondary | ICD-10-CM | POA: Diagnosis not present
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
## 2022-02-20 NOTE — Patient Instructions (Addendum)
Recommend to proceed with the following vaccines at your pharmacy:  Covid booster (bivalent) Flu shot this fall   HOLD METOPROLOL Check the  blood pressure regularly BP GOAL is between 110/65 and  135/85. If it is consistently higher or lower, let me know  Watch your heart rate, goal:  > 50s   Watch for increase A Fib symptoms     GO TO THE FRONT DESK, PLEASE SCHEDULE YOUR APPOINTMENTS Come back for  a check up in 2 months

## 2022-02-21 NOTE — Assessment & Plan Note (Addendum)
P- Atrial Fib: Has sporadic sxs that respond to flecanaide, HR today 49. BP in the low side at home. EKG today: Sinus rhythm, no acute. Plan: hold metoprolol thus will  watch BPs and watch for increased atrial fibrillation symptoms  HTN: see above, holding metoprolol, continue Lotensin, diltiazem, HCTZ. Gait d/o, h/o falls: worse gradually, of concern is the recent CT chest showed incidentally: Degenerative changes are noted with significant encroachment of neural foramina at C7-T1 level. Posterior bony spurs at T7-T8 level are causing extrinsic pressure over the ventral margin of thecal sac in the thoracic spinal canal. Plan: refer to neuro, myelopathy?  Also PT referral at patient request to prevent falls.   Weight loss: Noted, patient doing better with diet and is trying to be more active. RTC 2 months

## 2022-02-25 ENCOUNTER — Other Ambulatory Visit: Payer: Self-pay

## 2022-02-25 ENCOUNTER — Telehealth: Payer: Self-pay | Admitting: Cardiovascular Disease

## 2022-02-25 MED ORDER — FLECAINIDE ACETATE 100 MG PO TABS: ORAL_TABLET | ORAL | 0 refills | Status: DC

## 2022-02-25 NOTE — Telephone Encounter (Signed)
Pt c/o medication issue:  1. Name of Medication:   Disp Refills Start End   flecainide (TAMBOCOR) 100 MG tablet         2. How are you currently taking this medication (dosage and times per day)? TAKE 3 TABLETS('300MG'$  TOTAL) BY MOUTH AS NEEDED FOR EPISODE OF AFIB AS DIRECTED  3. Are you having a reaction (difficulty breathing--STAT)?   4. What is your medication issue? Patient has a question about it's usage.

## 2022-02-25 NOTE — Telephone Encounter (Signed)
Pt called with question regarding Flecainide.   Pt prescription reordered by provider Nicholes Rough PA-C; Pt question was regarding the frequency of how often to take?  Pt told that he is to take 3 tablets, 300 Mg total, by mouth AS NEEDED only, to treat new onset AFIB.  Pt educated on not taking too much, within a 24 hour period of time, since he is taking a larger dose, when symptoms occur, as needed.  Pt also educated on writing the time down on when he takes this medication to "mark time."   Pt also encouraged to write down his BP and HR.    Pt stated that Dr. Kathlene November d/c'd his beta blocker Metoprolol tartrate  12.5 Mg by mouth twice daily.  Pt stated Dr. Larose Kells thought BP was too low, so Lopressor stopped.  Pt stated his HR is holding steady at >50 Bpm.   Pt also stated Dr. Larose Kells wanted his BP under 135/85;  Pt states has not had one BP reading higher than Dr. Ethel Rana parameters.   Lopressor will be discontinued on Pt Med list, as he is no longer taking this.  Pt advised to reach out to [facility] with any questions with taking ANY of his meds, or if the frequency of the AFIB events increase, or BP / HR values change significantly.  Pt understood all education, no follow up required at this time.

## 2022-02-27 ENCOUNTER — Emergency Department (HOSPITAL_BASED_OUTPATIENT_CLINIC_OR_DEPARTMENT_OTHER): Payer: Medicare Other

## 2022-02-27 ENCOUNTER — Inpatient Hospital Stay (HOSPITAL_BASED_OUTPATIENT_CLINIC_OR_DEPARTMENT_OTHER)
Admission: EM | Admit: 2022-02-27 | Discharge: 2022-03-02 | DRG: 641 | Disposition: A | Discharge status: Home / Self Care | Payer: Medicare Other | Attending: Family Medicine | Admitting: Family Medicine

## 2022-02-27 ENCOUNTER — Other Ambulatory Visit: Payer: Self-pay

## 2022-02-27 ENCOUNTER — Encounter (HOSPITAL_COMMUNITY): Payer: Self-pay

## 2022-02-27 ENCOUNTER — Encounter: Payer: Self-pay | Admitting: Family Medicine

## 2022-02-27 ENCOUNTER — Encounter (HOSPITAL_BASED_OUTPATIENT_CLINIC_OR_DEPARTMENT_OTHER): Payer: Self-pay | Admitting: Emergency Medicine

## 2022-02-27 ENCOUNTER — Ambulatory Visit (INDEPENDENT_AMBULATORY_CARE_PROVIDER_SITE_OTHER): Payer: Medicare Other | Admitting: Family Medicine

## 2022-02-27 VITALS — BP 90/62 | HR 74 | Ht 71.0 in | Wt 216.8 lb

## 2022-02-27 DIAGNOSIS — R531 Weakness: Secondary | ICD-10-CM | POA: Diagnosis not present

## 2022-02-27 DIAGNOSIS — R110 Nausea: Secondary | ICD-10-CM

## 2022-02-27 DIAGNOSIS — E871 Hypo-osmolality and hyponatremia: Secondary | ICD-10-CM | POA: Diagnosis not present

## 2022-02-27 DIAGNOSIS — I480 Paroxysmal atrial fibrillation: Secondary | ICD-10-CM | POA: Diagnosis not present

## 2022-02-27 DIAGNOSIS — R634 Abnormal weight loss: Secondary | ICD-10-CM | POA: Diagnosis present

## 2022-02-27 DIAGNOSIS — Z823 Family history of stroke: Secondary | ICD-10-CM | POA: Diagnosis not present

## 2022-02-27 DIAGNOSIS — Z79899 Other long term (current) drug therapy: Secondary | ICD-10-CM

## 2022-02-27 DIAGNOSIS — E785 Hyperlipidemia, unspecified: Secondary | ICD-10-CM

## 2022-02-27 DIAGNOSIS — M6281 Muscle weakness (generalized): Secondary | ICD-10-CM | POA: Diagnosis present

## 2022-02-27 DIAGNOSIS — R630 Anorexia: Secondary | ICD-10-CM

## 2022-02-27 DIAGNOSIS — E876 Hypokalemia: Principal | ICD-10-CM | POA: Diagnosis present

## 2022-02-27 DIAGNOSIS — Z981 Arthrodesis status: Secondary | ICD-10-CM | POA: Diagnosis not present

## 2022-02-27 DIAGNOSIS — Z820 Family history of epilepsy and other diseases of the nervous system: Secondary | ICD-10-CM

## 2022-02-27 DIAGNOSIS — I10 Essential (primary) hypertension: Secondary | ICD-10-CM | POA: Diagnosis not present

## 2022-02-27 DIAGNOSIS — Z85828 Personal history of other malignant neoplasm of skin: Secondary | ICD-10-CM

## 2022-02-27 DIAGNOSIS — Z8249 Family history of ischemic heart disease and other diseases of the circulatory system: Secondary | ICD-10-CM | POA: Diagnosis not present

## 2022-02-27 DIAGNOSIS — K219 Gastro-esophageal reflux disease without esophagitis: Secondary | ICD-10-CM | POA: Diagnosis not present

## 2022-02-27 DIAGNOSIS — Z7901 Long term (current) use of anticoagulants: Secondary | ICD-10-CM | POA: Diagnosis not present

## 2022-02-27 DIAGNOSIS — N3289 Other specified disorders of bladder: Secondary | ICD-10-CM | POA: Diagnosis not present

## 2022-02-27 LAB — URINALYSIS, ROUTINE W REFLEX MICROSCOPIC
Bilirubin Urine: NEGATIVE
Glucose, UA: NEGATIVE mg/dL
Ketones, ur: NEGATIVE mg/dL
Leukocytes,Ua: NEGATIVE
Nitrite: NEGATIVE
Protein, ur: NEGATIVE mg/dL
Specific Gravity, Urine: 1.015 (ref 1.005–1.030)
pH: 6 (ref 5.0–8.0)

## 2022-02-27 LAB — CBC WITH DIFFERENTIAL/PLATELET
Abs Immature Granulocytes: 0.03 10*3/uL (ref 0.00–0.07)
Basophils Absolute: 0 10*3/uL (ref 0.0–0.1)
Basophils Relative: 0 %
Eosinophils Absolute: 0.1 10*3/uL (ref 0.0–0.5)
Eosinophils Relative: 1 %
HCT: 40.4 % (ref 39.0–52.0)
Hemoglobin: 14.8 g/dL (ref 13.0–17.0)
Immature Granulocytes: 0 %
Lymphocytes Relative: 12 %
Lymphs Abs: 1.1 10*3/uL (ref 0.7–4.0)
MCH: 31.8 pg (ref 26.0–34.0)
MCHC: 36.6 g/dL — ABNORMAL HIGH (ref 30.0–36.0)
MCV: 86.7 fL (ref 80.0–100.0)
Monocytes Absolute: 0.9 10*3/uL (ref 0.1–1.0)
Monocytes Relative: 10 %
Neutro Abs: 7.1 10*3/uL (ref 1.7–7.7)
Neutrophils Relative %: 77 %
Platelets: 307 10*3/uL (ref 150–400)
RBC: 4.66 MIL/uL (ref 4.22–5.81)
RDW: 12.6 % (ref 11.5–15.5)
WBC: 9.2 10*3/uL (ref 4.0–10.5)
nRBC: 0 % (ref 0.0–0.2)

## 2022-02-27 LAB — COMPREHENSIVE METABOLIC PANEL
ALT: 14 U/L (ref 0–44)
ALT: 16 U/L (ref 0–44)
AST: 28 U/L (ref 15–41)
AST: 24 U/L (ref 15–41)
Albumin: 4.1 g/dL (ref 3.5–5.0)
Albumin: 4.1 g/dL (ref 3.5–5.0)
Alkaline Phosphatase: 41 U/L (ref 38–126)
Alkaline Phosphatase: 40 U/L (ref 38–126)
Anion gap: 13 (ref 5–15)
Anion gap: 11 (ref 5–15)
BUN: 17 mg/dL (ref 8–23)
BUN: 13 mg/dL (ref 8–23)
CO2: 28 mmol/L (ref 22–32)
CO2: 26 mmol/L (ref 22–32)
Calcium: 8.8 mg/dL — ABNORMAL LOW (ref 8.9–10.3)
Calcium: 8.9 mg/dL (ref 8.9–10.3)
Chloride: 85 mmol/L — ABNORMAL LOW (ref 98–111)
Chloride: 91 mmol/L — ABNORMAL LOW (ref 98–111)
Creatinine, Ser: 1.14 mg/dL (ref 0.61–1.24)
Creatinine, Ser: 0.99 mg/dL (ref 0.61–1.24)
GFR, Estimated: >60 mL/min (ref >60)
GFR, Estimated: >60 mL/min (ref >60)
Glucose, Bld: 120 mg/dL — ABNORMAL HIGH (ref 70–99)
Glucose, Bld: 100 mg/dL — ABNORMAL HIGH (ref 70–99)
Potassium: 2.5 mmol/L — CL (ref 3.5–5.1)
Potassium: 2.8 mmol/L — ABNORMAL LOW (ref 3.5–5.1)
Sodium: 126 mmol/L — ABNORMAL LOW (ref 135–145)
Sodium: 128 mmol/L — ABNORMAL LOW (ref 135–145)
Total Bilirubin: 1.3 mg/dL — ABNORMAL HIGH (ref 0.3–1.2)
Total Bilirubin: 1.4 mg/dL — ABNORMAL HIGH (ref 0.3–1.2)
Total Protein: 7.2 g/dL (ref 6.5–8.1)
Total Protein: 7 g/dL (ref 6.5–8.1)

## 2022-02-27 LAB — OSMOLALITY, URINE: Osmolality, Ur: 425 mOsm/kg (ref 300–900)

## 2022-02-27 LAB — SODIUM, URINE, RANDOM: Sodium, Ur: 35 mmol/L

## 2022-02-27 LAB — URINALYSIS, MICROSCOPIC (REFLEX)
Squamous Epithelial / HPF: NONE SEEN (ref 0–5)
WBC, UA: NONE SEEN WBC/hpf (ref 0–5)

## 2022-02-27 LAB — MAGNESIUM: Magnesium: 1.8 mg/dL (ref 1.7–2.4)

## 2022-02-27 LAB — TROPONIN I (HIGH SENSITIVITY)
Troponin I (High Sensitivity): 7 ng/L (ref <18)
Troponin I (High Sensitivity): 7 ng/L (ref <18)

## 2022-02-27 LAB — LIPASE, BLOOD: Lipase: 37 U/L (ref 11–51)

## 2022-02-27 MED ORDER — ACETAMINOPHEN 325 MG PO TABS
650.0000 mg | ORAL_TABLET | Freq: Four times a day (QID) | ORAL | Status: DC | PRN
  Administered 2022-02-28 – 2022-03-02 (×3): 650 mg via ORAL
  Filled 2022-02-27 (×3): qty 2

## 2022-02-27 MED ORDER — POTASSIUM CHLORIDE CRYS ER 20 MEQ PO TBCR
40.0000 meq | EXTENDED_RELEASE_TABLET | Freq: Every day | ORAL | Status: DC
  Administered 2022-02-27 – 2022-03-02 (×4): 40 meq via ORAL
  Filled 2022-02-27 (×4): qty 2

## 2022-02-27 MED ORDER — ENSURE ENLIVE PO LIQD
237.0000 mL | Freq: Two times a day (BID) | ORAL | Status: DC
Start: 2022-02-28 — End: 2022-03-02
  Administered 2022-03-01 (×2): 237 mL via ORAL

## 2022-02-27 MED ORDER — ACETAMINOPHEN 650 MG RE SUPP: 650.0000 mg | Freq: Four times a day (QID) | RECTAL | Status: DC | PRN

## 2022-02-27 MED ORDER — SODIUM CHLORIDE 0.9 % IV SOLN: INTRAVENOUS | Status: DC | PRN

## 2022-02-27 MED ORDER — POTASSIUM CHLORIDE 10 MEQ/100ML IV SOLN
10.0000 meq | Freq: Once | INTRAVENOUS | Status: AC
  Administered 2022-02-27: 10 meq via INTRAVENOUS
  Filled 2022-02-27: qty 100

## 2022-02-27 MED ORDER — POTASSIUM CHLORIDE CRYS ER 20 MEQ PO TBCR
40.0000 meq | EXTENDED_RELEASE_TABLET | Freq: Once | ORAL | Status: AC
  Administered 2022-02-27: 40 meq via ORAL
  Filled 2022-02-27: qty 2

## 2022-02-27 MED ORDER — ONDANSETRON HCL 4 MG/2ML IJ SOLN: 4.0000 mg | Freq: Once | INTRAMUSCULAR | Status: DC

## 2022-02-27 MED ORDER — APIXABAN 5 MG PO TABS
5.0000 mg | ORAL_TABLET | Freq: Two times a day (BID) | ORAL | Status: DC
  Administered 2022-02-27 – 2022-03-02 (×6): 5 mg via ORAL
  Filled 2022-02-27 (×6): qty 1

## 2022-02-27 MED ORDER — ROSUVASTATIN CALCIUM 20 MG PO TABS
20.0000 mg | ORAL_TABLET | ORAL | Status: DC
  Administered 2022-02-27 – 2022-03-01 (×2): 20 mg via ORAL
  Filled 2022-02-27 (×2): qty 1

## 2022-02-27 MED ORDER — ONDANSETRON HCL 4 MG PO TABS: 4.0000 mg | ORAL_TABLET | Freq: Three times a day (TID) | ORAL | 0 refills | Status: DC | PRN

## 2022-02-27 MED ORDER — PROCHLORPERAZINE EDISYLATE 10 MG/2ML IJ SOLN
10.0000 mg | Freq: Four times a day (QID) | INTRAMUSCULAR | Status: DC | PRN
  Administered 2022-03-02: 10 mg via INTRAVENOUS
  Filled 2022-02-27: qty 2

## 2022-02-27 MED ORDER — LACTATED RINGERS IV BOLUS
1000.0000 mL | Freq: Once | INTRAVENOUS | Status: AC
  Administered 2022-02-27: 1000 mL via INTRAVENOUS

## 2022-02-27 MED ORDER — IOHEXOL 300 MG/ML  SOLN
100.0000 mL | Freq: Once | INTRAMUSCULAR | Status: AC | PRN
  Administered 2022-02-27: 100 mL via INTRAVENOUS

## 2022-02-27 NOTE — ED Notes (Signed)
Report given to [facility] RN, pt stable for transfer, carelink here to transport pt

## 2022-02-27 NOTE — H&P (Signed)
History and Physical    Patient: Rodney Sawyer:[id number] DOB: 26-May-1946 DOA: 02/27/2022 DOS: the patient was seen and examined on 02/27/2022 PCP: Colon Branch, MD  Patient coming from: Home  Chief Complaint:  Chief Complaint  Patient presents with   Weakness   HPI: Rodney Sawyer is a 76 y.o. male with medical history significant of PAF, GERD, HLD, HTN. Presenting with nausea and weakness. He reports that he's had a week of nausea (w/o vomiting) and poor appetite. During this time, he has tried pepto, but it has not helped. He's been able to tolerate liquid, but he has not had an appetite. Has has become more weak as the week has progressed. He has not had any sick contacts, fevers, or urinary changes. He reports that his last couple of stools have been lose. He otherwise denies symptoms. When his symptoms did not improve. He decided to come to the ED for evaluation. He denies any other aggravating or alleviating factors.    Review of Systems: As mentioned in the history of present illness. All other systems reviewed and are negative. Past Medical History:  Diagnosis Date   AF (paroxysmal atrial fibrillation) ([place])    dx ~ 2006   Depression    GERD (gastroesophageal reflux disease)    occasionally   Hepatitis A 1960   containmented water at school when he was a child   Hyperlipidemia    Hypertension    Seborrheic keratosis 12/2015   R midline upper back   Shingles 12-2013   Skin cancer    Basal cell   Past Surgical History:  Procedure Laterality Date   ANTERIOR CERVICAL DECOMP/DISCECTOMY FUSION N/A 03/13/2013   Procedure: ANTERIOR CERVICAL DECOMPRESSION/DISCECTOMY FUSION. Cervical four-five, Cervical five-six;  Surgeon: Otilio Connors, MD;  Location: [facility] NEURO ORS;  Service: Neurosurgery;  Laterality: N/A;   COLONOSCOPY     COLONOSCOPY W/ POLYPECTOMY  2001   negative 2006;[facility]   CYSTOSCOPY  2004   Negative; done for microscopic hematuria, asymptomatic   INSERTION OF  MESH N/A 10/10/2020   Procedure: INSERTION OF MESH;  Surgeon: Ralene Ok, MD;  Location: [place];  Service: General;  Laterality: N/A;   POLYPECTOMY     TONSILLECTOMY AND ADENOIDECTOMY     VASECTOMY     XI ROBOTIC ASSISTED VENTRAL HERNIA N/A 10/10/2020   Procedure: XI ROBOTIC ASSISTED VENTRAL HERNIA REPAIR WITH MESH;  Surgeon: Ralene Ok, MD;  Location: [place];  Service: General;  Laterality: N/A;   Social History:  reports that he has never smoked. He has never used smokeless tobacco. He reports that he does not currently use alcohol after a past usage of about 7.0 standard drinks of alcohol per week. He reports that he does not use drugs.  No Known Allergies  Family History  Problem Relation Age of Onset   Alzheimer's disease Father    Alzheimer's disease Mother    Heart attack Paternal Uncle 69       sudden death   Stroke Paternal Grandfather 105   Cancer Neg Hx    Diabetes Neg Hx    Colon cancer Neg Hx    Colon polyps Neg Hx    Esophageal cancer Neg Hx    Rectal cancer Neg Hx    Stomach cancer Neg Hx     Prior to Admission medications   Medication Sig Start Date End Date Taking? Authorizing Provider  apixaban (ELIQUIS) 5 MG TABS tablet Take 1 tablet (5 mg total) by  mouth 2 (two) times daily. 01/14/22   Elgie Collard, PA-C  benazepril (LOTENSIN) 20 MG tablet TAKE 1 TABLET(20 MG) BY MOUTH TWICE DAILY 01/07/22   Colon Branch, MD  diltiazem (DILT-XR) 180 MG 24 hr capsule TAKE ONE CAPSULE BY MOUTH TWICE DAILY 01/04/22   Colon Branch, MD  docusate sodium (COLACE) 100 MG capsule Take 100 mg by mouth daily as needed for mild constipation.    [provider]  flecainide (TAMBOCOR) 100 MG tablet TAKE 3 TABLETS('300MG'$  TOTAL) BY MOUTH AS NEEDED FOR EPISODE OF AFIB AS DIRECTED 02/25/22   Elgie Collard, PA-C  hydrochlorothiazide (HYDRODIURIL) 25 MG tablet TAKE 1 TABLET(25 MG) BY MOUTH DAILY Patient taking differently: Take 12.5 mg by mouth daily. 12/11/21   Colon Branch, MD  Omega-3  Fatty Acids (FISH OIL) 1200 MG CAPS Take 1,200 mg by mouth daily.    [provider]  ondansetron (ZOFRAN) 4 MG tablet Take 1 tablet (4 mg total) by mouth every 8 (eight) hours as needed for nausea or vomiting. 02/27/22   Terrilyn Saver, NP  rosuvastatin (CRESTOR) 20 MG tablet Take 20 mg by mouth every Monday, Wednesday, and Friday.    [provider]    Physical Exam: Vitals:   02/27/22 1100 02/27/22 1204 02/27/22 1300 02/27/22 1415  BP: 125/73 135/82 124/83 129/81  Pulse: 62 69 64 63  Resp: '18 19 14 18  '$ Temp:    98.1 F (36.7 C)  TempSrc:    Oral  SpO2: 94% 99% 99% 98%   General: 76 y.o. male resting in bed in NAD Eyes: PERRL, normal sclera ENMT: Nares patent w/o discharge, orophaynx clear, dentition normal, ears w/o discharge/lesions/ulcers Neck: Supple, trachea midline Cardiovascular: RRR, +S1, S2, no m/g/r, equal pulses throughout Respiratory: CTABL, no w/r/r, normal WOB GI: BS+, NDNT, no masses noted, no organomegaly noted MSK: No e/c/c Neuro: A&O x 3, no focal deficits Psyc: Appropriate interaction and affect, calm/cooperative  Data Reviewed:  Lab Results  Component Value Date   NA 126 (L) 02/27/2022   K 2.5 (LL) 02/27/2022   CO2 28 02/27/2022   GLUCOSE 120 (H) 02/27/2022   BUN 17 02/27/2022   CREATININE 1.14 02/27/2022   CALCIUM 8.8 (L) 02/27/2022   GFRNONAA >60 02/27/2022   Lab Results  Component Value Date   WBC 9.2 02/27/2022   HGB 14.8 02/27/2022   HCT 40.4 02/27/2022   MCV 86.7 02/27/2022   PLT 307 02/27/2022   CT ab/pelvis 1. No acute abdominopelvic process. 2. Interval uncomplicated RIGHT lateral abdominal wall mesh herniorrhaphy with repair of previously large lateral ventral hernia. 3. Degenerative changes of the spine. Additional incidental, chronic and senescent findings as above.  EKG: sinus, no st elevations  Assessment and Plan: Hyponatremia Hypokalemia     - admit to inpt, tele     - Mg2+ ok, replace K+     - check  urine Na+/osm     - q6h renal function panels     - fluids  Nausea     - CT ab/pelvis negative     - anti-emetics, monitor  Generalized weakness     - secondary to poor PO intake and above     - tx as above and add PT eval  PAF     - continue home regimen when confirmed  HLD     - continue home regimen when confirmed  HTN     - continue home regimen when confirmed  [facility]  Planning:   Code Status: DNI  Consults: None  Family Communication: None at bedside  Severity of Illness: The appropriate patient status for this patient is INPATIENT. Inpatient status is judged to be reasonable and necessary in order to provide the required intensity of service to ensure the patient's safety. The patient's presenting symptoms, physical exam findings, and initial radiographic and laboratory data in the context of their chronic comorbidities is felt to place them at high risk for further clinical deterioration. Furthermore, it is not anticipated that the patient will be medically stable for discharge from the hospital within 2 midnights of admission.   * I certify that at the point of admission it is my clinical judgment that the patient will require inpatient hospital care spanning beyond 2 midnights from the point of admission due to high intensity of service, high risk for further deterioration and high frequency of surveillance required.*  Author: Jonnie Finner, DO 02/27/2022 4:53 PM  For on call review [email or web address].

## 2022-02-27 NOTE — ED Triage Notes (Signed)
Pt reports feeling generally weak and no appetite x 1 wk; +nausea; PCP sent here for IVFs

## 2022-02-27 NOTE — Progress Notes (Signed)
Plan of Care Note for accepted transfer   Patient: Rodney Sawyer MRN: [id number]   DOA: 02/27/2022  Facility requesting transfer: [facility] Requesting Provider: Dr. Nechama Guard Reason for transfer: Hyponatremia, hypokalemia Facility course: 76 yo M w/ history of HTN, HLD, PAF on eliquis. Presenting with generalized weakness, nausea, decreased PO intake for the last week. Workup show hyponatremia, hypokalemia. Imaging negative. Started on fluids, K+ replacement.   Plan of care: The patient is accepted for admission to Telemetry unit, at [facility]. While holding at [facility], medical decision making responsibilities remain with the [facility]. Upon arrival to [facility], [facility] will assume care. Thank you.   Author: Jonnie Finner, DO 02/27/2022  Check [email or web address] for on-call coverage.  Nursing staff, Please call [place] number on Amion as soon as patient's arrival, so appropriate admitting provider can evaluate the pt.

## 2022-02-27 NOTE — Progress Notes (Signed)
Acute Office Visit  Subjective:     Patient ID: Rodney Sawyer, male    DOB: February 04, 1946, 76 y.o.   MRN: [id number]  CC: nausea, loss of appetite   HPI Patient is in today for nausea, loss of appetite.   Patient states that all of a sudden last week he had decreased appetite and nausea. States he has lost about 4 pounds so far and is feeling quite weak.  States that if he tries to eat anything he about gags from the nausea, but has not had any vomiting yet. Reports he is having one BM per day, normal other than 2 episodes of diarrhea earlier in the week. Yesterday all he was able to eat was a 12 bowl of cereal, banana, orange juice, grits, and 1/2 a graham cracker. He denies any vomiting, constipation, fevers, chills, body aches, abdominal pain, bloating/distention, URI symptoms, blood in stool, urinary changes. He had a negative home COVID test yesterday. No known sick contacts. States he is trying to take in oral fluids, but wife thinks he is not getting enough. Wife states that he has bloating in the past and she is worried about his gallbladder; denies any current bloating or abdominal pain. Denies any worsening depression/anxiety that may be affecting appetite.     ROS All review of systems negative except what is listed in the HPI      Objective:    BP 90/62 (BP Location: Left Arm, Patient Position: Sitting)   Pulse 74   Ht '5\' 11"'$  (1.803 m)   Wt 216 lb 12.8 oz (98.3 kg)   BMI 30.24 kg/m    Physical Exam Vitals reviewed.  Constitutional:      Appearance: Normal appearance.  Cardiovascular:     Rate and Rhythm: Normal rate and regular rhythm.  Pulmonary:     Effort: Pulmonary effort is normal.     Breath sounds: Normal breath sounds.  Abdominal:     General: Abdomen is flat. Bowel sounds are normal. There is no distension.     Palpations: Abdomen is soft. There is no mass.     Tenderness: There is no abdominal tenderness. There is no guarding or rebound. Negative  signs include Murphy's sign and McBurney's sign.  Skin:    General: Skin is warm and dry.  Neurological:     General: No focal deficit present.     Mental Status: He is alert and oriented to person, place, and time. Mental status is at baseline.  Psychiatric:        Mood and Affect: Mood normal.        Behavior: Behavior normal.        Thought Content: Thought content normal.        Judgment: Judgment normal.     No results found for any visits on 02/27/22.      Assessment & Plan:   Problem List Items Addressed This Visit   None Visit Diagnoses     Nausea    -  Primary   Relevant Medications   ondansetron (ZOFRAN) 4 MG tablet   Other Relevant Orders   US Abdomen Complete   Amylase   Lipase   CBC   Comprehensive metabolic panel   Decreased appetite       Relevant Medications   ondansetron (ZOFRAN) 4 MG tablet   Other Relevant Orders   US Abdomen Complete   Amylase   Lipase   CBC   Comprehensive metabolic panel  Starting with labs and abdominal ultrasound - we will let you know as results come in.  BP is soft, likely related to minimal food/fluid intake the past several days. I will give you some zofran to help with the nausea and need you to try to increase fluids. If you are unable to tolerate oral fluids, BP continues dropping, dizziness worsens, urine output decreases, confusion/lethargy develops, etc then you need to go to the emergency department for IV fluids.    Meds ordered this encounter  Medications   ondansetron (ZOFRAN) 4 MG tablet    Sig: Take 1 tablet (4 mg total) by mouth every 8 (eight) hours as needed for nausea or vomiting.    Dispense:  20 tablet    Refill:  0    Order Specific Question:   Supervising Provider    Answer:   Penni Homans A [[id number]]       *Update - upon giving patient a glass of water prior to sending him to lab/US, he states that he is very dizzy and unsure if he can tolerate any more oral fluids, and they would like to  go ahead and go to the ED for IV fluids. Escorted via wheelchair by CMA.   Return if symptoms worsen or fail to improve.  Terrilyn Saver, NP

## 2022-02-27 NOTE — Patient Instructions (Signed)
Starting with labs and abdominal ultrasound - we will let you know as results come in.  BP is soft, likely related to minimal food/fluid intake the past several days. I will give you some zofran to help with the nausea and need you to try to increase fluids. If you are unable to tolerate oral fluids, BP continues dropping, dizziness worsens, urine output decreases, confusion/lethargy develops, etc then you need to go to the emergency department for IV fluids.

## 2022-02-27 NOTE — ED Notes (Signed)
Patient transported to CT 

## 2022-02-27 NOTE — ED Triage Notes (Signed)
Received TC from PCP.  She relates that the patient has been nauseated and decreased appetite.  Pt insisting to be sent to ED for fluids.

## 2022-02-27 NOTE — ED Provider Notes (Signed)
[place] EMERGENCY DEPARTMENT Provider Note   CSN: [id number] Arrival date & time: 02/27/22  0955     History  Chief Complaint  Patient presents with   Weakness    MACARI ZALESKY is a 76 y.o. male. With pmh HTN, HLD, pAFib on Eliquis, anxiety, depression presenting with generalized weakness, nausea and decreased appetite x 1 week.  Patient said he started feeling bad about 1 week ago with generalized nausea and decreased appetite.  He denies any pain associated with it.  No chest pain, no abdominal pain, no pain with urination.  He has had some mild nonbloody diarrhea.  He has never had any vomiting with the nausea.  He has lost approximately 4 pounds with this episodes.  He also notes feeling generally weaker everywhere.  No focal weakness, no numbness or tingling, no slurred speech.  He went to see his PCP this morning who tried to get him to tolerate p.o. hydration but he was unable and came to the ER for further evaluation.    Weakness      Home Medications Prior to Admission medications   Medication Sig Start Date End Date Taking? Authorizing Provider  apixaban (ELIQUIS) 5 MG TABS tablet Take 1 tablet (5 mg total) by mouth 2 (two) times daily. 01/14/22   Elgie Collard, PA-C  benazepril (LOTENSIN) 20 MG tablet TAKE 1 TABLET(20 MG) BY MOUTH TWICE DAILY 01/07/22   Colon Branch, MD  diltiazem (DILT-XR) 180 MG 24 hr capsule TAKE ONE CAPSULE BY MOUTH TWICE DAILY 01/04/22   Colon Branch, MD  docusate sodium (COLACE) 100 MG capsule Take 100 mg by mouth daily as needed for mild constipation.    [provider]  flecainide (TAMBOCOR) 100 MG tablet TAKE 3 TABLETS('300MG'$  TOTAL) BY MOUTH AS NEEDED FOR EPISODE OF AFIB AS DIRECTED 02/25/22   Elgie Collard, PA-C  hydrochlorothiazide (HYDRODIURIL) 25 MG tablet TAKE 1 TABLET(25 MG) BY MOUTH DAILY Patient taking differently: Take 12.5 mg by mouth daily. 12/11/21   Colon Branch, MD  Omega-3 Fatty Acids (FISH OIL) 1200 MG CAPS Take  1,200 mg by mouth daily.    [provider]  ondansetron (ZOFRAN) 4 MG tablet Take 1 tablet (4 mg total) by mouth every 8 (eight) hours as needed for nausea or vomiting. 02/27/22   Terrilyn Saver, NP  rosuvastatin (CRESTOR) 20 MG tablet Take 20 mg by mouth every Monday, Wednesday, and Friday.    [provider]      Allergies    Patient has no known allergies.    Review of Systems   Review of Systems  Neurological:  Positive for weakness.    Physical Exam Updated Vital Signs BP 129/81   Pulse 63   Temp 98.1 F (36.7 C) (Oral)   Resp 18   SpO2 98%  Physical Exam Constitutional: Alert and oriented. Well appearing and in no distress. Slightly fatigued Eyes: Conjunctivae are normal. ENT      Head: Normocephalic and atraumatic.      Nose: No congestion.      Mouth/Throat: Mucous membranes are moist.      Neck: No stridor. Cardiovascular: S1, S2,  Normal and symmetric distal pulses are present in all extremities.Warm and well perfused. Respiratory: Normal respiratory effort. Breath sounds are normal. Gastrointestinal: Soft and nontender. There is no CVA tenderness. Musculoskeletal: Normal range of motion in all extremities. No pitting edema. Neurologic: Normal speech and language.  Moving all extremities equally, sensation grossly  intact, no facial droop, no focal neurologic deficits Skin: Skin is warm, dry and intact. No rash noted. Psychiatric: Mood and affect are normal. Speech and behavior are normal. ED Results / Procedures / Treatments   Labs (all labs ordered are listed, but only abnormal results are displayed) Labs Reviewed  URINALYSIS, ROUTINE W REFLEX MICROSCOPIC - Abnormal; Notable for the following components:      Result Value   Hgb urine dipstick MODERATE (*)    All other components within normal limits  COMPREHENSIVE METABOLIC PANEL - Abnormal; Notable for the following components:   Sodium 126 (*)    Potassium 2.5 (*)    Chloride 85 (*)     Glucose, Bld 120 (*)    Calcium 8.8 (*)    Total Bilirubin 1.3 (*)    All other components within normal limits  CBC WITH DIFFERENTIAL/PLATELET - Abnormal; Notable for the following components:   MCHC 36.6 (*)    All other components within normal limits  URINALYSIS, MICROSCOPIC (REFLEX) - Abnormal; Notable for the following components:   Bacteria, UA RARE (*)    Non Squamous Epithelial PRESENT (*)    All other components within normal limits  LIPASE, BLOOD  MAGNESIUM  SODIUM, URINE, RANDOM  TROPONIN I (HIGH SENSITIVITY)  TROPONIN I (HIGH SENSITIVITY)    EKG EKG Interpretation  Date/Time:  Wednesday February 27 2022 10:40:20 EDT Ventricular Rate:  61 PR Interval:  164 QRS Duration: 122 QT Interval:  472 QTC Calculation: 476 R Axis:   -39 Text Interpretation: Sinus rhythm Atrial premature complex Right bundle branch block Confirmed by Georgina Snell [phone]) on 02/27/2022 12:05:06 PM  Radiology CT ABDOMEN PELVIS W CONTRAST  Result Date: 02/27/2022 CLINICAL DATA:  Pain.  Vomiting. EXAM: CT ABDOMEN AND PELVIS WITH CONTRAST TECHNIQUE: Multidetector CT imaging of the abdomen and pelvis was performed using the standard protocol following bolus administration of intravenous contrast. RADIATION DOSE REDUCTION: This exam was performed according to the departmental dose-optimization program which includes automated exposure control, adjustment of the mA and/or kV according to patient size and/or use of iterative reconstruction technique. CONTRAST:  172m OMNIPAQUE IOHEXOL 300 MG/ML  SOLN COMPARISON:  CT AP, 08/07/2020.  CT chest, 12/19/2021. FINDINGS: Lower chest: No acute abnormality. Stable size and appearance of 3 mm RIGHT lower lobe pulmonary nodule Hepatobiliary: Normal size and appearance of the liver. Sub-1 cm hypodense superior RIGHT hepatic lobe hypodensity, too small to adequately characterize though likely a small cyst versus hemangioma. Anteversion of gallbladder. No gallstones,  gallbladder wall thickening, or biliary dilatation. Pancreas: No pancreatic ductal dilatation or surrounding inflammatory changes. Spleen: Normal in size without focal abnormality. Adrenals/Urinary Tract: Adrenal glands are unremarkable. Kidneys are normal, without renal calculi, focal lesion, or hydronephrosis. Bladder is moderately distended. Stomach/Bowel: Stomach is within normal limits. Appendix appears normal. No evidence of bowel wall thickening, distention, or inflammatory changes. Vascular/Lymphatic: Mild burden of aortic atherosclerosis. No enlarged abdominal or pelvic lymph nodes. Reproductive: Prostate is unremarkable. Other: Interval RIGHT lateral abdominal wall mesh herniorrhaphy with reduction/repair of previously large lateral ventral hernia. Small fat-containing umbilical hernia. Small fat-containing inguinal hernias versus cord lipomas. No abdominopelvic ascites. Musculoskeletal: Bilateral L5-S1 spondylolysis with trace anterolisthesis. No acute osseous findings. IMPRESSION: 1. No acute abdominopelvic process. 2. Interval uncomplicated RIGHT lateral abdominal wall mesh herniorrhaphy with repair of previously large lateral ventral hernia. 3. Degenerative changes of the spine. Additional incidental, chronic and senescent findings as above. Electronically Signed   By: JMichaelle BirksM.D.   On: 02/27/2022 12:15  Procedures .Critical Care  Performed by: Elgie Congo, MD Authorized by: Elgie Congo, MD   Critical care provider statement:    Critical care time (minutes):  30   Critical care was necessary to treat or prevent imminent or life-threatening deterioration of the following conditions: hypokalemia with qt prolongation needing iv repletion.   Critical care was time spent personally by me on the following activities:  Development of treatment plan with patient or surrogate, discussions with consultants, evaluation of patient's response to treatment, examination of patient,  ordering and review of laboratory studies, ordering and review of radiographic studies, ordering and performing treatments and interventions, pulse oximetry, re-evaluation of patient's condition and review of old charts   I assumed direction of critical care for this patient from another provider in my specialty: no     Care discussed with: admitting provider     Remain on constant cardiac monitoring normal sinus rhythm  Medications Ordered in ED Medications  ondansetron (ZOFRAN) injection 4 mg (4 mg Intravenous Not Given 02/27/22 1239)  0.9 %  sodium chloride infusion (has no administration in time range)  lactated ringers bolus 1,000 mL (0 mLs Intravenous Stopped 02/27/22 1123)  iohexol (OMNIPAQUE) 300 MG/ML solution 100 mL (100 mLs Intravenous Contrast Given 02/27/22 1139)  potassium chloride SA (KLOR-CON M) CR tablet 40 mEq (40 mEq Oral Given 02/27/22 1200)  potassium chloride 10 mEq in 100 mL IVPB (0 mEq Intravenous Stopped 02/27/22 1304)    ED Course/ Medical Decision Making/ A&P Clinical Course as of 02/27/22 1542  Wed Feb 27, 2022  1135 Patient sodium 126 likely hypovolemic in nature due to decreased p.o. intake and persistent nausea as well as hypokalemic 2.5 which is certainly contributing to generalized fatigue and weakness.  Ordering for repletion.  QTc 476 on EKG.  No ischemic changes reassuring high sensitive troponin 7, unlikely ACS underlying cause.  CTAP on individual interpretation with no obvious abnormalities consistent with radiology read. [VB]  1310 UA not consistent with UTI with no urinary symptoms.  Holding off from antibiotics.  Discussed case with hospitalist who will evaluate patient's case and orders for admission. [VB]    Clinical Course User Index [VB] Elgie Congo, MD                           Medical Decision Making MARCELUS DUBBERLY is a 76 y.o. male. With pmh HTN, HLD, pAFib on Eliquis, anxiety, depression presenting with generalized weakness, nausea and  decreased appetite x 1 week.   Patient describes overall weakness or fatigue, rather than any particular focal weakness.  Glucose normal.  EKG NSR and unchanged from priors as interpreted by me. Qtc slightly prolonged 476.  Regarding patient's generalized weakness, differential is broad and includes but is not limited to acute electrolyte disturbances (such as hypokalemia, hypoglycemia, hypoMg, hypoCa) especially in the setting of decreased p.o. intake and persistent nausea and weight loss, infection, arrhythmia, atypical ACS, anemia, dehydration among multiple other etiologies. Intact neurologic exam and chronic nature of symptoms make CNS etiology such as stroke, tumor unlikely.    Cardiac etiologies such as arrhythmia or atypical ACS will be evaluated with EKG and troponin; anemia will be ruled out with CBC; infection will be ruled out with basic infectious workup (CT abdomen pelvis with IV contrast, urinalysis); acute electrolyte abnormalities ruled out with serum studies.  IVF for dehydration.    Plan to obtain CBC, Chem 10,, EKG, troponin, UA, CT  abdomen pelvis with IV contrast. Will give patient IVF and IV Zofran. Disposition pending workup and reassessment.   Discussed with patient that if the above workup is negative, emergent conditions have been ruled out and patient can follow up as an outpatient.   Amount and/or Complexity of Data Reviewed Labs: ordered. Decision-making details documented in ED Course. Radiology: ordered and independent interpretation performed. Decision-making details documented in ED Course. ECG/medicine tests: independent interpretation performed. Decision-making details documented in ED Course.  Risk Prescription drug management. Decision regarding hospitalization.    Final Clinical Impression(s) / ED Diagnoses Final diagnoses:  Weakness  Nausea  Hypokalemia    Rx / DC Orders ED Discharge Orders     None         Elgie Congo,  MD 02/27/22 1542

## 2022-02-28 DIAGNOSIS — I480 Paroxysmal atrial fibrillation: Secondary | ICD-10-CM | POA: Diagnosis not present

## 2022-02-28 DIAGNOSIS — R531 Weakness: Secondary | ICD-10-CM

## 2022-02-28 DIAGNOSIS — R110 Nausea: Secondary | ICD-10-CM

## 2022-02-28 DIAGNOSIS — E876 Hypokalemia: Secondary | ICD-10-CM | POA: Diagnosis not present

## 2022-02-28 DIAGNOSIS — E871 Hypo-osmolality and hyponatremia: Secondary | ICD-10-CM

## 2022-02-28 LAB — RENAL FUNCTION PANEL
Albumin: 3.4 g/dL — ABNORMAL LOW (ref 3.5–5.0)
Albumin: 3.5 g/dL (ref 3.5–5.0)
Albumin: 3.6 g/dL (ref 3.5–5.0)
Anion gap: 9 (ref 5–15)
Anion gap: 7 (ref 5–15)
Anion gap: 8 (ref 5–15)
BUN: 13 mg/dL (ref 8–23)
BUN: 12 mg/dL (ref 8–23)
BUN: 11 mg/dL (ref 8–23)
CO2: 27 mmol/L (ref 22–32)
CO2: 28 mmol/L (ref 22–32)
CO2: 28 mmol/L (ref 22–32)
Calcium: 8.6 mg/dL — ABNORMAL LOW (ref 8.9–10.3)
Calcium: 8.5 mg/dL — ABNORMAL LOW (ref 8.9–10.3)
Calcium: 8.3 mg/dL — ABNORMAL LOW (ref 8.9–10.3)
Chloride: 94 mmol/L — ABNORMAL LOW (ref 98–111)
Chloride: 96 mmol/L — ABNORMAL LOW (ref 98–111)
Chloride: 95 mmol/L — ABNORMAL LOW (ref 98–111)
Creatinine, Ser: 1.02 mg/dL (ref 0.61–1.24)
Creatinine, Ser: 0.88 mg/dL (ref 0.61–1.24)
Creatinine, Ser: 0.96 mg/dL (ref 0.61–1.24)
GFR, Estimated: >60 mL/min (ref >60)
GFR, Estimated: >60 mL/min (ref >60)
GFR, Estimated: >60 mL/min (ref >60)
Glucose, Bld: 90 mg/dL (ref 70–99)
Glucose, Bld: 90 mg/dL (ref 70–99)
Glucose, Bld: 111 mg/dL — ABNORMAL HIGH (ref 70–99)
Phosphorus: 3.2 mg/dL (ref 2.5–4.6)
Phosphorus: 2.7 mg/dL (ref 2.5–4.6)
Phosphorus: 2.1 mg/dL — ABNORMAL LOW (ref 2.5–4.6)
Potassium: 2.5 mmol/L — CL (ref 3.5–5.1)
Potassium: 3.5 mmol/L (ref 3.5–5.1)
Potassium: 3.4 mmol/L — ABNORMAL LOW (ref 3.5–5.1)
Sodium: 130 mmol/L — ABNORMAL LOW (ref 135–145)
Sodium: 131 mmol/L — ABNORMAL LOW (ref 135–145)
Sodium: 131 mmol/L — ABNORMAL LOW (ref 135–145)

## 2022-02-28 LAB — CBC
HCT: 36.8 % — ABNORMAL LOW (ref 39.0–52.0)
Hemoglobin: 12.9 g/dL — ABNORMAL LOW (ref 13.0–17.0)
MCH: 31.5 pg (ref 26.0–34.0)
MCHC: 35.1 g/dL (ref 30.0–36.0)
MCV: 90 fL (ref 80.0–100.0)
Platelets: 247 10*3/uL (ref 150–400)
RBC: 4.09 MIL/uL — ABNORMAL LOW (ref 4.22–5.81)
RDW: 12.7 % (ref 11.5–15.5)
WBC: 9.5 10*3/uL (ref 4.0–10.5)
nRBC: 0 % (ref 0.0–0.2)

## 2022-02-28 MED ORDER — POTASSIUM CHLORIDE 20 MEQ PO PACK
60.0000 meq | PACK | Freq: Once | ORAL | Status: AC
Start: 2022-02-28 — End: 2022-02-28
  Administered 2022-02-28: 60 meq via ORAL
  Filled 2022-02-28: qty 3

## 2022-02-28 MED ORDER — POTASSIUM PHOSPHATES 15 MMOLE/5ML IV SOLN
30.0000 mmol | Freq: Once | INTRAVENOUS | Status: AC
  Administered 2022-02-28: 30 mmol via INTRAVENOUS
  Filled 2022-02-28: qty 10

## 2022-02-28 MED ORDER — SODIUM CHLORIDE 0.9 % IV SOLN: INTRAVENOUS | Status: DC

## 2022-02-28 MED ORDER — DILTIAZEM HCL ER COATED BEADS 180 MG PO CP24
180.0000 mg | ORAL_CAPSULE | Freq: Two times a day (BID) | ORAL | Status: DC
  Administered 2022-02-28 – 2022-03-02 (×4): 180 mg via ORAL
  Filled 2022-02-28 (×4): qty 1

## 2022-02-28 MED ORDER — ADULT MULTIVITAMIN W/MINERALS CH
1.0000 | ORAL_TABLET | Freq: Every day | ORAL | Status: DC
  Administered 2022-02-28 – 2022-03-02 (×3): 1 via ORAL
  Filled 2022-02-28 (×3): qty 1

## 2022-02-28 MED ORDER — CITALOPRAM HYDROBROMIDE 20 MG PO TABS
20.0000 mg | ORAL_TABLET | Freq: Every morning | ORAL | Status: DC
  Administered 2022-03-01 – 2022-03-02 (×2): 20 mg via ORAL
  Filled 2022-02-28 (×2): qty 1

## 2022-02-28 NOTE — Progress Notes (Signed)
Date and time results received: 02/28/22 0139  Test: Potassium  Critical Value: 2.5  Name of Provider Notified: Olena Heckle, NP  Orders Received? Or Actions Taken?: See Mar for potassium orders

## 2022-02-28 NOTE — Progress Notes (Signed)
Chaplain engaged in an initial visit with Rodney Sawyer.  Chaplain provided AD paperwork but was unable to provide education because Rodney Sawyer was speaking with the physician.    Chaplain will follow-up.    02/28/22 1400  Clinical Encounter Type  Visited With Patient  Visit Type Initial;Social support  Spiritual Encounters  Spiritual Needs Literature;Brochure

## 2022-02-28 NOTE — Progress Notes (Signed)
  Progress Note   Patient: Rodney Sawyer MHD:[id number] DOB: 12/22/1945 DOA: 02/27/2022     1 DOS: the patient was seen and examined on 02/28/2022   Brief hospital course: 76 year old man PMH including PAF presenting with nausea and weakness for several weeks.  Admitted for acute hyponatremia, hypokalemia, nausea and generalized weakness.  Assessment and Plan: Hyponatremia, hypokalemia --Etiology unclear.  Could be secondary to hydrochlorothiazide although he has been on this for 6 months or citalopram.  May be secondary to self-limited illness causing nausea and poor oral intake. --Sodium slowly improving.  Give a little more IV fluids today.  Repeat potassium and phosphorus.  Magnesium was within normal limits. -- Hold hydrochlorothiazide until follow-up.  Nausea --Etiology unclear, now resolved, may have been secondary to hyponatremia.  CT abdomen pelvis unremarkable.  Generalized weakness, likely secondary to poor oral intake -- Expect to improve his diet improves  Paroxysmal atrial fibrillation -- Continue home regimen     Subjective:  Feels better Ate a sandwich for lunch, first in awhile Nausea seems resolved No pain, no abdominal pain  Physical Exam: Vitals:   02/27/22 2158 02/28/22 0130 02/28/22 0629 02/28/22 1332  BP: 128/81 124/81 120/81 112/76  Pulse: 63 66 65 71  Resp: '14 14 14 16  '$ Temp: 98.2 F (36.8 C) 98.6 F (37 C) 98.4 F (36.9 C) (!) 97.5 F (36.4 C)  TempSrc: Oral Oral Oral Oral  SpO2: 94% 96% 96% 95%  Weight:      Height:       Physical Exam Vitals reviewed.  Constitutional:      General: He is not in acute distress.    Appearance: He is not ill-appearing or toxic-appearing.  Cardiovascular:     Rate and Rhythm: Normal rate and regular rhythm.     Heart sounds: No murmur heard. Pulmonary:     Effort: Pulmonary effort is normal. No respiratory distress.     Breath sounds: No wheezing, rhonchi or rales.  Abdominal:     General: There is no  distension.     Palpations: Abdomen is soft.     Tenderness: There is no abdominal tenderness.  Neurological:     Mental Status: He is alert.  Psychiatric:        Mood and Affect: Mood normal.        Behavior: Behavior normal.     Data Reviewed:  Na+ 131 K+ 3.4 Phos 2.1  Family Communication: wife of 48 years at bedside  Disposition: Status is: Inpatient Remains inpatient appropriate because: electrolyte disturbance  Planned Discharge Destination: Home    Time spent: 35 minutes  Author: Murray Hodgkins, MD 02/28/2022 5:09 PM  For on call review [email or web address].
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
## 2022-02-28 NOTE — Hospital Course (Signed)
76 year old man PMH including PAF presenting with nausea and weakness for several weeks.  Admitted for acute hyponatremia, hypokalemia, nausea and generalized weakness.

## 2022-03-01 DIAGNOSIS — Z85828 Personal history of other malignant neoplasm of skin: Secondary | ICD-10-CM | POA: Diagnosis not present

## 2022-03-01 DIAGNOSIS — E876 Hypokalemia: Secondary | ICD-10-CM | POA: Diagnosis not present

## 2022-03-01 DIAGNOSIS — R634 Abnormal weight loss: Secondary | ICD-10-CM | POA: Diagnosis not present

## 2022-03-01 DIAGNOSIS — Z823 Family history of stroke: Secondary | ICD-10-CM | POA: Diagnosis not present

## 2022-03-01 DIAGNOSIS — I480 Paroxysmal atrial fibrillation: Secondary | ICD-10-CM | POA: Diagnosis not present

## 2022-03-01 DIAGNOSIS — R531 Weakness: Secondary | ICD-10-CM | POA: Diagnosis not present

## 2022-03-01 DIAGNOSIS — E785 Hyperlipidemia, unspecified: Secondary | ICD-10-CM | POA: Diagnosis not present

## 2022-03-01 DIAGNOSIS — Z8249 Family history of ischemic heart disease and other diseases of the circulatory system: Secondary | ICD-10-CM | POA: Diagnosis not present

## 2022-03-01 DIAGNOSIS — Z981 Arthrodesis status: Secondary | ICD-10-CM | POA: Diagnosis not present

## 2022-03-01 DIAGNOSIS — Z7901 Long term (current) use of anticoagulants: Secondary | ICD-10-CM | POA: Diagnosis not present

## 2022-03-01 DIAGNOSIS — E871 Hypo-osmolality and hyponatremia: Secondary | ICD-10-CM | POA: Diagnosis not present

## 2022-03-01 DIAGNOSIS — K219 Gastro-esophageal reflux disease without esophagitis: Secondary | ICD-10-CM | POA: Diagnosis not present

## 2022-03-01 DIAGNOSIS — M6281 Muscle weakness (generalized): Secondary | ICD-10-CM | POA: Diagnosis not present

## 2022-03-01 DIAGNOSIS — Z79899 Other long term (current) drug therapy: Secondary | ICD-10-CM | POA: Diagnosis not present

## 2022-03-01 DIAGNOSIS — I10 Essential (primary) hypertension: Secondary | ICD-10-CM | POA: Diagnosis not present

## 2022-03-01 DIAGNOSIS — Z820 Family history of epilepsy and other diseases of the nervous system: Secondary | ICD-10-CM | POA: Diagnosis not present

## 2022-03-01 LAB — BASIC METABOLIC PANEL
Anion gap: 8 (ref 5–15)
BUN: 10 mg/dL (ref 8–23)
CO2: 28 mmol/L (ref 22–32)
Calcium: 8.5 mg/dL — ABNORMAL LOW (ref 8.9–10.3)
Chloride: 99 mmol/L (ref 98–111)
Creatinine, Ser: 0.9 mg/dL (ref 0.61–1.24)
GFR, Estimated: >60 mL/min (ref >60)
Glucose, Bld: 95 mg/dL (ref 70–99)
Potassium: 3.2 mmol/L — ABNORMAL LOW (ref 3.5–5.1)
Sodium: 135 mmol/L (ref 135–145)

## 2022-03-01 LAB — PHOSPHORUS: Phosphorus: 4 mg/dL (ref 2.5–4.6)

## 2022-03-01 LAB — MAGNESIUM: Magnesium: 2.2 mg/dL (ref 1.7–2.4)

## 2022-03-01 MED ORDER — POTASSIUM CHLORIDE CRYS ER 20 MEQ PO TBCR
40.0000 meq | EXTENDED_RELEASE_TABLET | Freq: Once | ORAL | Status: AC
  Administered 2022-03-01: 40 meq via ORAL
  Filled 2022-03-01: qty 2

## 2022-03-01 NOTE — Progress Notes (Addendum)
  Progress Note   Patient: Rodney Sawyer MWN:[id number] DOB: 1946/06/16 DOA: 02/27/2022     2 DOS: the patient was seen and examined on 03/01/2022   Brief hospital course: 76 year old man PMH including PAF presenting with nausea and weakness for several weeks.  Admitted for acute hyponatremia, hypokalemia, nausea and generalized weakness.  Assessment and Plan: Hyponatremia, hypokalemia --Etiology unclear.  Could be secondary to hydrochlorothiazide although he has been on this for 6 months or citalopram.  May be secondary to self-limited illness causing nausea and poor oral intake. --Sodium improved, now back to normal.  Hold hydrochlorothiazide until follow-up. --Monitor for recurrence on citalopram.   Nausea --Etiology unclear, waxes and wanes.  Recurrent now despite normal sodium.  CT abdomen pelvis unremarkable.  Etiology unclear.  Antibiotics as needed.   Generalized weakness, likely secondary to poor oral intake -- Expect to improve his diet improves.  Outpatient PT recommended.   Paroxysmal atrial fibrillation -- Continue apixaban and diltiazem      Subjective:  Feels worse today Malaise  No appetite, barely ate anything No pain, no nausea or vomiting Dizzy  Physical Exam: Vitals:   02/28/22 2314 03/01/22 0453 03/01/22 1356 03/01/22 1946  BP: (!) 146/90 135/88 132/78 (!) 143/88  Pulse: 67 65 71 69  Resp: '14 14 16 18  '$ Temp: 97.6 F (36.4 C) (!) 97.4 F (36.3 C) 97.9 F (36.6 C) 97.8 F (36.6 C)  TempSrc: Oral Oral Oral Oral  SpO2: 94% 97% 95% 94%  Weight:      Height:       Physical Exam Vitals reviewed.  Constitutional:      General: He is not in acute distress.    Appearance: He is not ill-appearing or toxic-appearing.  Cardiovascular:     Rate and Rhythm: Normal rate and regular rhythm.     Heart sounds: No murmur heard. Pulmonary:     Effort: Pulmonary effort is normal. No respiratory distress.     Breath sounds: No wheezing, rhonchi or rales.   Abdominal:     Palpations: Abdomen is soft.  Neurological:     Mental Status: He is alert.  Psychiatric:        Mood and Affect: Mood normal.        Behavior: Behavior normal.     Data Reviewed:  K+ 3.2 Mg and phos WNL Na+ WNL  Family Communication: wife at bedside  Disposition: Status is: Inpatient Remains inpatient appropriate because: malaise, poor oral intake  Planned Discharge Destination: Home    Time spent: 25 minutes  Author: Murray Hodgkins, MD 03/01/2022 8:20 PM  For on call review [email or web address].

## 2022-03-01 NOTE — Plan of Care (Signed)
  Problem: Education: Goal: Knowledge of General Education information will improve Description: Including pain rating scale, medication(s)/side effects and non-pharmacologic comfort measures Outcome: Progressing   Problem: Clinical Measurements: Goal: Ability to maintain clinical measurements within normal limits will improve Outcome: Progressing   

## 2022-03-02 DIAGNOSIS — E876 Hypokalemia: Secondary | ICD-10-CM | POA: Diagnosis not present

## 2022-03-02 DIAGNOSIS — Z981 Arthrodesis status: Secondary | ICD-10-CM | POA: Diagnosis not present

## 2022-03-02 DIAGNOSIS — Z820 Family history of epilepsy and other diseases of the nervous system: Secondary | ICD-10-CM | POA: Diagnosis not present

## 2022-03-02 DIAGNOSIS — E785 Hyperlipidemia, unspecified: Secondary | ICD-10-CM | POA: Diagnosis not present

## 2022-03-02 DIAGNOSIS — Z85828 Personal history of other malignant neoplasm of skin: Secondary | ICD-10-CM | POA: Diagnosis not present

## 2022-03-02 DIAGNOSIS — E871 Hypo-osmolality and hyponatremia: Secondary | ICD-10-CM | POA: Diagnosis not present

## 2022-03-02 DIAGNOSIS — R110 Nausea: Secondary | ICD-10-CM | POA: Diagnosis not present

## 2022-03-02 DIAGNOSIS — Z8249 Family history of ischemic heart disease and other diseases of the circulatory system: Secondary | ICD-10-CM | POA: Diagnosis not present

## 2022-03-02 DIAGNOSIS — I10 Essential (primary) hypertension: Secondary | ICD-10-CM | POA: Diagnosis not present

## 2022-03-02 DIAGNOSIS — K219 Gastro-esophageal reflux disease without esophagitis: Secondary | ICD-10-CM | POA: Diagnosis not present

## 2022-03-02 DIAGNOSIS — I480 Paroxysmal atrial fibrillation: Secondary | ICD-10-CM | POA: Diagnosis not present

## 2022-03-02 DIAGNOSIS — Z7901 Long term (current) use of anticoagulants: Secondary | ICD-10-CM | POA: Diagnosis not present

## 2022-03-02 DIAGNOSIS — Z823 Family history of stroke: Secondary | ICD-10-CM | POA: Diagnosis not present

## 2022-03-02 DIAGNOSIS — Z79899 Other long term (current) drug therapy: Secondary | ICD-10-CM | POA: Diagnosis not present

## 2022-03-02 DIAGNOSIS — R634 Abnormal weight loss: Secondary | ICD-10-CM | POA: Diagnosis not present

## 2022-03-02 DIAGNOSIS — M6281 Muscle weakness (generalized): Secondary | ICD-10-CM | POA: Diagnosis not present

## 2022-03-02 DIAGNOSIS — R531 Weakness: Secondary | ICD-10-CM | POA: Diagnosis not present

## 2022-03-02 LAB — PHOSPHORUS: Phosphorus: 2.6 mg/dL (ref 2.5–4.6)

## 2022-03-02 LAB — BASIC METABOLIC PANEL
Anion gap: 6 (ref 5–15)
BUN: 19 mg/dL (ref 8–23)
CO2: 27 mmol/L (ref 22–32)
Calcium: 8.7 mg/dL — ABNORMAL LOW (ref 8.9–10.3)
Chloride: 100 mmol/L (ref 98–111)
Creatinine, Ser: 0.98 mg/dL (ref 0.61–1.24)
GFR, Estimated: >60 mL/min (ref >60)
Glucose, Bld: 155 mg/dL — ABNORMAL HIGH (ref 70–99)
Potassium: 3.9 mmol/L (ref 3.5–5.1)
Sodium: 133 mmol/L — ABNORMAL LOW (ref 135–145)

## 2022-03-02 LAB — MAGNESIUM: Magnesium: 2.1 mg/dL (ref 1.7–2.4)

## 2022-03-02 MED ORDER — ONDANSETRON HCL 4 MG PO TABS: 4.0000 mg | ORAL_TABLET | Freq: Three times a day (TID) | ORAL | 0 refills | Status: DC | PRN

## 2022-03-02 MED ORDER — POTASSIUM CHLORIDE CRYS ER 20 MEQ PO TBCR: 40.0000 meq | EXTENDED_RELEASE_TABLET | Freq: Every day | ORAL | 0 refills | Status: DC

## 2022-03-02 NOTE — Clinical Social Work Note (Signed)
  Transition of Care [facility]) Screening Note   Patient Details  Name: WAYLIN DORKO Date of Birth: 08/17/1945   Transition of Care [facility]) CM/SW Contact:    Ross Ludwig, LCSW Phone Number: 03/02/2022, 2:54 PM    Transition of Care Department [facility]) has reviewed patient and no TOC needs have been identified at this time. We will continue to monitor patient advancement through interdisciplinary progression rounds. If new patient transition needs arise, please place a TOC consult.

## 2022-03-02 NOTE — Discharge Summary (Signed)
Physician Discharge Summary   Patient: Rodney Sawyer MRN: [id number] DOB: 1945/10/13  Admit date:     02/27/2022  Discharge date: 03/02/22  Discharge Physician: Murray Hodgkins   PCP: Colon Branch, MD   Recommendations at discharge:   Follow-up hyponatremia, see below  Discharge Diagnoses: Principal Problem:   Hypokalemia Active Problems:   HLD (hyperlipidemia)   Essential hypertension   AF (paroxysmal atrial fibrillation) (HCC)   Hyponatremia   Nausea   Generalized weakness  Resolved Problems:   * No resolved hospital problems. *  Hospital Course: 76 year old man PMH including PAF presenting with nausea and weakness for several weeks.  Admitted for acute hyponatremia, hypokalemia, nausea and generalized weakness.  HCTZ discontinued.  Treated with saline with gradual clinical improvement.  Tolerating diet, stable for discharge home.  Hyponatremia, hypokalemia --Etiology unclear.  Could be secondary to hydrochlorothiazide although he has been on this for 6 months.  Also consider citalopram in regard to low sodium.  May be secondary to self-limited illness causing nausea and poor oral intake. -- Sodium near normal.  Follow-up as an outpatient.   Nausea --Etiology unclear, waxes and wanes.  CT abdomen pelvis unremarkable.  Etiology unclear.  No signs or symptoms of severe illness.   Generalized weakness, likely secondary to poor oral intake -- Expect to improve his diet improves.  Outpatient PT recommended.   Paroxysmal atrial fibrillation -- Continue apixaban and diltiazem      Consultants:  None  Procedures performed:  None   Disposition: Home Diet recommendation:  Regular diet DISCHARGE MEDICATION: Allergies as of 03/02/2022   No Known Allergies      Medication List     STOP taking these medications    hydrochlorothiazide 25 MG tablet Commonly known as: HYDRODIURIL       TAKE these medications    apixaban 5 MG Tabs tablet Commonly known as:  Eliquis Take 1 tablet (5 mg total) by mouth 2 (two) times daily. What changed: when to take this   benazepril 20 MG tablet Commonly known as: LOTENSIN TAKE 1 TABLET(20 MG) BY MOUTH TWICE DAILY What changed: See the new instructions.   citalopram 20 MG tablet Commonly known as: CELEXA Take 20 mg by mouth in the morning.   diltiazem 180 MG 24 hr capsule Commonly known as: Dilt-XR TAKE ONE CAPSULE BY MOUTH TWICE DAILY What changed:  how much to take how to take this when to take this additional instructions   docusate sodium 100 MG capsule Commonly known as: COLACE Take 100 mg by mouth daily as needed for mild constipation.   Fish Oil 1200 MG Caps Take 1,200 mg by mouth daily.   flecainide 100 MG tablet Commonly known as: TAMBOCOR TAKE 3 TABLETS('300MG'$  TOTAL) BY MOUTH AS NEEDED FOR EPISODE OF AFIB AS DIRECTED What changed:  how much to take how to take this when to take this reasons to take this additional instructions   omeprazole 20 MG capsule Commonly known as: PRILOSEC Take 20 mg by mouth daily as needed (for heartburn).   ondansetron 4 MG tablet Commonly known as: Zofran Take 1 tablet (4 mg total) by mouth every 8 (eight) hours as needed for nausea or vomiting.   polyethylene glycol powder 17 GM/SCOOP powder Commonly known as: GLYCOLAX/MIRALAX Take 17 g by mouth daily as needed for mild constipation.   potassium chloride SA 20 MEQ tablet Commonly known as: KLOR-CON M Take 2 tablets (40 mEq total) by mouth daily. Start taking on: March 03, 2022  rosuvastatin 20 MG tablet Commonly known as: CRESTOR Take 20 mg by mouth every Monday, Wednesday, and Friday. Notes to patient: As Scheduled        Follow-up [place], MD. Schedule an appointment as soon as possible for a visit in 1 week(s).   Specialty: Internal Medicine Contact information: [address] [place] [address] [phone]         Josue Hector, MD .   Specialty: Cardiology Contact information: [address] [place] [address] [phone]               feels ok, getting up ok, did eat dinner last night, has ordered lunch. Doesn't usually eat breakfast. No vomiting. Loose stool. Discharge Exam: Filed Weights   02/27/22 1717  Weight: 99.2 kg   Physical Exam Vitals reviewed.  Constitutional:      General: He is not in acute distress.    Appearance: He is not ill-appearing or toxic-appearing.  Cardiovascular:     Rate and Rhythm: Normal rate and regular rhythm.     Heart sounds: No murmur heard. Pulmonary:     Effort: Pulmonary effort is normal. No respiratory distress.     Breath sounds: No wheezing, rhonchi or rales.  Neurological:     Mental Status: He is alert.  Psychiatric:        Mood and Affect: Mood normal.        Behavior: Behavior normal.      Condition at discharge: good  The results of significant diagnostics from this hospitalization (including imaging, microbiology, ancillary and laboratory) are listed below for reference.   Imaging Studies: CT ABDOMEN PELVIS W CONTRAST  Result Date: 02/27/2022 CLINICAL DATA:  Pain.  Vomiting. EXAM: CT ABDOMEN AND PELVIS WITH CONTRAST TECHNIQUE: Multidetector CT imaging of the abdomen and pelvis was performed using the standard protocol following bolus administration of intravenous contrast. RADIATION DOSE REDUCTION: This exam was performed according to the departmental dose-optimization program which includes automated exposure control, adjustment of the mA and/or kV according to patient size and/or use of iterative reconstruction technique. CONTRAST:  133m OMNIPAQUE IOHEXOL 300 MG/ML  SOLN COMPARISON:  CT AP, 08/07/2020.  CT chest, 12/19/2021. FINDINGS: Lower chest: No acute abnormality. Stable size and appearance of 3 mm RIGHT lower lobe pulmonary nodule Hepatobiliary: Normal size and appearance of the liver. Sub-1 cm hypodense superior  RIGHT hepatic lobe hypodensity, too small to adequately characterize though likely a small cyst versus hemangioma. Anteversion of gallbladder. No gallstones, gallbladder wall thickening, or biliary dilatation. Pancreas: No pancreatic ductal dilatation or surrounding inflammatory changes. Spleen: Normal in size without focal abnormality. Adrenals/Urinary Tract: Adrenal glands are unremarkable. Kidneys are normal, without renal calculi, focal lesion, or hydronephrosis. Bladder is moderately distended. Stomach/Bowel: Stomach is within normal limits. Appendix appears normal. No evidence of bowel wall thickening, distention, or inflammatory changes. Vascular/Lymphatic: Mild burden of aortic atherosclerosis. No enlarged abdominal or pelvic lymph nodes. Reproductive: Prostate is unremarkable. Other: Interval RIGHT lateral abdominal wall mesh herniorrhaphy with reduction/repair of previously large lateral ventral hernia. Small fat-containing umbilical hernia. Small fat-containing inguinal hernias versus cord lipomas. No abdominopelvic ascites. Musculoskeletal: Bilateral L5-S1 spondylolysis with trace anterolisthesis. No acute osseous findings. IMPRESSION: 1. No acute abdominopelvic process. 2. Interval uncomplicated RIGHT lateral abdominal wall mesh herniorrhaphy with repair of previously large lateral ventral hernia. 3. Degenerative changes of the spine. Additional incidental, chronic and senescent findings as above. Electronically Signed  By: Michaelle Birks M.D.   On: 02/27/2022 12:15    Microbiology: Results for orders placed or performed during the hospital encounter of 10/07/20  SARS CORONAVIRUS 2 (TAT 6-24 HRS) Nasopharyngeal Nasopharyngeal Swab     Status: None   Collection Time: 10/07/20  2:12 PM   Specimen: Nasopharyngeal Swab  Result Value Ref Range Status   SARS Coronavirus 2 NEGATIVE NEGATIVE Final    Comment: (NOTE) SARS-CoV-2 target nucleic acids are NOT DETECTED.  The SARS-CoV-2 RNA is generally  detectable in upper and lower respiratory specimens during the acute phase of infection. Negative results do not preclude SARS-CoV-2 infection, do not rule out co-infections with other pathogens, and should not be used as the sole basis for treatment or other patient management decisions. Negative results must be combined with clinical observations, patient history, and epidemiological information. The expected result is Negative.  Fact Sheet for Patients: SugarRoll.be  Fact Sheet for Healthcare Providers: https://www.woods-mathews.com/  This test is not yet approved or cleared by the [place] FDA and  has been authorized for detection and/or diagnosis of SARS-CoV-2 by FDA under an Emergency Use Authorization (EUA). This EUA will remain  in effect (meaning this test can be used) for the duration of the COVID-19 declaration under Se ction 564(b)(1) of the Act, 21 U.S.C. section 360bbb-3(b)(1), unless the authorization is terminated or revoked sooner.  Performed at [facility], [place] [address], [place], [place] [address]     Labs: CBC: Recent Labs  Lab 02/27/22 1016 02/28/22 0033  WBC 9.2 9.5  NEUTROABS 7.1  --   HGB 14.8 12.9*  HCT 40.4 36.8*  MCV 86.7 90.0  PLT 307 643   Basic Metabolic Panel: Recent Labs  Lab 02/27/22 1016 02/27/22 1721 02/28/22 0033 02/28/22 0651 02/28/22 1127 03/01/22 0558 03/02/22 1254  NA 126*   < > 130* 131* 131* 135 133*  K 2.5*   < > 2.5* 3.5 3.4* 3.2* 3.9  CL 85*   < > 94* 96* 95* 99 100  CO2 28   < > '27 28 28 28 27  '$ GLUCOSE 120*   < > 90 90 111* 95 155*  BUN 17   < > '13 12 11 10 19  '$ CREATININE 1.14   < > 1.02 0.88 0.96 0.90 0.98  CALCIUM 8.8*   < > 8.6* 8.5* 8.3* 8.5* 8.7*  MG 1.8  --   --   --   --  2.2 2.1  PHOS  --   --  3.2 2.7 2.1* 4.0 2.6   < > = values in this interval not displayed.   Liver Function Tests: Recent Labs  Lab 02/27/22 1016 02/27/22 1721 02/28/22 0033  02/28/22 0651 02/28/22 1127  AST 28 24  --   --   --   ALT 14 16  --   --   --   ALKPHOS 41 40  --   --   --   BILITOT 1.3* 1.4*  --   --   --   PROT 7.2 7.0  --   --   --   ALBUMIN 4.1 4.1 3.4* 3.5 3.6   CBG: No results for input(s): "GLUCAP" in the last 168 hours.  Discharge time spent: less than 30 minutes.  Signed: Murray Hodgkins, MD [facility] 03/02/2022

## 2022-03-02 NOTE — Progress Notes (Signed)
Patient discharged to home with family, discharge instructions reviewed with patient who verbalized understanding. 

## 2022-03-04 ENCOUNTER — Telehealth: Payer: Self-pay

## 2022-03-04 NOTE — Telephone Encounter (Signed)
Transition Care Management Follow-up Telephone Call Date of discharge and from where: [facility] 03-02-22 Dx: hypokalemia How have you been since you were released from the hospital? Doing ok-  Any questions or concerns? Wife states that BP "spiked high last night"-159/100- pt took half tab of diuretic and it went down but has been "spiking high"   Items Reviewed: Did the pt receive and understand the discharge instructions provided? Yes  Medications obtained and verified? Yes  Other? No  Any new allergies since your discharge? No  Dietary orders reviewed? Yes Do you have support at home? Yes   Home Care and Equipment/Supplies: Were home health services ordered? no If so, what is the name of the agency? na  Has the agency set up a time to come to the patient's home? not applicable Were any new equipment or medical supplies ordered?  No What is the name of the medical supply agency? na Were you able to get the supplies/equipment? not applicable Do you have any questions related to the use of the equipment or supplies? No  Functional Questionnaire: (I = Independent and D = Dependent) ADLs: I  Bathing/Dressing- I  Meal Prep- I  Eating- I  Maintaining continence- I  Transferring/Ambulation- I  Managing Meds- I  Follow up appointments reviewed:  PCP Hospital f/u appt confirmed? Yes  Scheduled to see Roma Schanz DO on 03-05-22 @ 1120am. [facility] Hospital f/u appt confirmed? No  Pt is aware of making appt with Cardiologist . Are transportation arrangements needed? No  If their condition worsens, is the pt aware to call PCP or go to the Emergency Dept.? Yes Was the patient provided with contact information for the PCP's office or ED? Yes Was to pt encouraged to call back with questions or concerns? Yes

## 2022-03-05 ENCOUNTER — Encounter: Payer: Self-pay | Admitting: Family Medicine

## 2022-03-05 ENCOUNTER — Ambulatory Visit (INDEPENDENT_AMBULATORY_CARE_PROVIDER_SITE_OTHER): Payer: Medicare Other | Admitting: Family Medicine

## 2022-03-05 ENCOUNTER — Telehealth: Payer: Self-pay | Admitting: Internal Medicine

## 2022-03-05 ENCOUNTER — Other Ambulatory Visit: Payer: Self-pay | Admitting: Family Medicine

## 2022-03-05 VITALS — BP 118/78 | HR 73 | Temp 97.5°F | Resp 20 | Ht 71.0 in | Wt 214.4 lb

## 2022-03-05 DIAGNOSIS — E876 Hypokalemia: Secondary | ICD-10-CM

## 2022-03-05 DIAGNOSIS — I10 Essential (primary) hypertension: Secondary | ICD-10-CM

## 2022-03-05 DIAGNOSIS — E871 Hypo-osmolality and hyponatremia: Secondary | ICD-10-CM

## 2022-03-05 LAB — COMPREHENSIVE METABOLIC PANEL
ALT: 15 U/L (ref 0–53)
AST: 15 U/L (ref 0–37)
Albumin: 4.3 g/dL (ref 3.5–5.2)
Alkaline Phosphatase: 42 U/L (ref 39–117)
BUN: 14 mg/dL (ref 6–23)
CO2: 24 mEq/L (ref 19–32)
Calcium: 9.1 mg/dL (ref 8.4–10.5)
Chloride: 95 mEq/L — ABNORMAL LOW (ref 96–112)
Creatinine, Ser: 0.97 mg/dL (ref 0.40–1.50)
GFR: 75.46 mL/min (ref >60.00)
Glucose, Bld: 91 mg/dL (ref 70–99)
Potassium: 4.8 mEq/L (ref 3.5–5.1)
Sodium: 129 mEq/L — ABNORMAL LOW (ref 135–145)
Total Bilirubin: 1.1 mg/dL (ref 0.2–1.2)
Total Protein: 6.6 g/dL (ref 6.0–8.3)

## 2022-03-05 LAB — CBC WITH DIFFERENTIAL/PLATELET
Basophils Absolute: 0 10*3/uL (ref 0.0–0.1)
Basophils Relative: 0.6 % (ref 0.0–3.0)
Eosinophils Absolute: 0.1 10*3/uL (ref 0.0–0.7)
Eosinophils Relative: 1.6 % (ref 0.0–5.0)
HCT: 41.2 % (ref 39.0–52.0)
Hemoglobin: 14.4 g/dL (ref 13.0–17.0)
Lymphocytes Relative: 15.8 % (ref 12.0–46.0)
Lymphs Abs: 1.1 10*3/uL (ref 0.7–4.0)
MCHC: 35 g/dL (ref 30.0–36.0)
MCV: 90.9 fl (ref 78.0–100.0)
Monocytes Absolute: 0.9 10*3/uL (ref 0.1–1.0)
Monocytes Relative: 12.2 % — ABNORMAL HIGH (ref 3.0–12.0)
Neutro Abs: 4.9 10*3/uL (ref 1.4–7.7)
Neutrophils Relative %: 69.8 % (ref 43.0–77.0)
Platelets: 272 10*3/uL (ref 150.0–400.0)
RBC: 4.53 Mil/uL (ref 4.22–5.81)
RDW: 13.9 % (ref 11.5–15.5)
WBC: 7 10*3/uL (ref 4.0–10.5)

## 2022-03-05 NOTE — Patient Instructions (Signed)
Hyponatremia Hyponatremia is when the amount of salt (sodium) in a person's blood is too low. When sodium levels are low, the cells absorb extra water, which causes them to swell. The swelling happens throughout the body, but it mostly affects the brain. What are the causes? This condition may be caused by: Certain medical conditions, such as: Heart, kidney, or liver problems. Thyroid problems. Adrenal gland problems. Metabolic conditions, such as Addison's disease or syndrome of inappropriate antidiuresis (SIAD). Excessive vomiting, diarrhea, or sweating. Certain medicines or illegal drugs. Fluids given through an IV. What increases the risk? You are more likely to develop this condition if you: Have certain medical conditions such as heart, kidney, or liver failure. Have a medical condition that causes frequent or excessive diarrhea. Participate in intense physical activities, such as marathon running. Take certain medicines that affect the sodium and fluid balance in the blood. Some of these medicine types include: Diuretics. NSAIDs, such as ibuprofen. Some opioid pain medicines. Some antidepressants. Some seizure prevention medicines. What are the signs or symptoms? Symptoms of this condition include: Headache. Nausea and vomiting. Being very tired (lethargic). Muscle weakness and cramping. Loss of appetite. Feeling weak or light-headed. Severe symptoms of this condition include: Confusion. Agitation. Having a rapid heart rate. Fainting. Seizures. Coma. How is this diagnosed? This condition is diagnosed based on: A physical exam. Your medical history. Tests, including: Blood tests. Urine tests. How is this treated? Treatment for this condition depends on the cause. Treatment may include: Getting fluids through an IV that is inserted into one of your veins. Medicines to correct the sodium imbalance. If medicines are causing the condition, the medicines will need to  be adjusted. Limiting your water or fluid intake to get the correct sodium balance, in certain cases. Monitoring in the hospital to closely watch your symptoms for improvement. Follow these instructions at home:  Take over-the-counter and prescription medicines only as told by your health care provider. Many medicines can make this condition worse. Talk with your health care provider about any medicines that you are currently taking. Do not drink alcohol. Keep all follow-up visits. This is important. Contact a health care provider if: You develop worsening nausea, fatigue, headache, confusion, or weakness. Your symptoms go away and then return. Get help right away if: You have a seizure. You faint. You have ongoing diarrhea or vomiting. Summary Hyponatremia is when the amount of salt (sodium) in your blood is too low. When sodium levels are low, your cells absorb extra water, which causes them to swell. The swelling happens throughout the body, but it mostly affects the brain. Treatment for this condition depends on the cause. It may include receiving IV fluids, taking or adjusting medicines, limiting fluid intake, and monitoring in the hospital. This information is not intended to replace advice given to you by your health care provider. Make sure you discuss any questions you have with your health care provider. Document Revised: 01/09/2021 Document Reviewed: 01/09/2021 Elsevier Patient Education  [address].

## 2022-03-05 NOTE — Telephone Encounter (Signed)
Labs ordered. Please schedule a lab appt please

## 2022-03-05 NOTE — Assessment & Plan Note (Signed)
Recheck labs 

## 2022-03-05 NOTE — Assessment & Plan Note (Signed)
Well controlled, no changes to meds. Encouraged heart healthy diet such as the DASH diet and exercise as tolerated.  °

## 2022-03-05 NOTE — Assessment & Plan Note (Signed)
Recheck today    Chemistry      Component Value Date/Time   NA 133 (L) 03/02/2022 1254   K 3.9 03/02/2022 1254   CL 100 03/02/2022 1254   CO2 27 03/02/2022 1254   BUN 19 03/02/2022 1254   CREATININE 0.98 03/02/2022 1254   CREATININE 1.05 12/07/2021 1511      Component Value Date/Time   CALCIUM 8.7 (L) 03/02/2022 1254   ALKPHOS 40 02/27/2022 1721   AST 24 02/27/2022 1721   ALT 16 02/27/2022 1721   BILITOT 1.4 (H) 02/27/2022 1721

## 2022-03-05 NOTE — Telephone Encounter (Signed)
Pt stated he was instructed to come back in 2 weeks for labs. Checked with lab and did not see any orders. Please advise what labs need to be redone so pt can be scheduled.

## 2022-03-05 NOTE — Progress Notes (Signed)
Established Patient Office Visit  Subjective   Patient ID: Rodney Sawyer, male    DOB: [birth year]  Age: 76 y.o. MRN: [id number]  Chief Complaint  Patient presents with   Hospitalization Follow-up    HPI Pt is here for hosp f/u --- he was admitted 8/16-8/19 for weakness , hypokalemia, hyponatremia.  Pt feels much better.  His labs looked better on d/c.   He has no more nausea or diarrhea.    His wife is here as well.   Patient Active Problem List   Diagnosis Date Noted   Hypokalemia 02/27/2022   Hyponatremia 02/27/2022   Nausea 02/27/2022   Generalized weakness 02/27/2022   Pulmonary nodule 10/19/2020   PCP NOTES >>>>>>>>>>>>>>>>>>>>>>>>>>>>>> 10/02/2015   Annual physical exam 09/12/2014   Anxiety and depression 01/29/2014   Obesity (BMI 30-39.9) 01/04/2014   Nonspecific abnormal electrocardiogram (ECG) (EKG) 12/15/2012   COLONIC POLYPS, HX OF 09/12/2010   Hyperglycemia, unspecified 05/26/2009   HLD (hyperlipidemia) 03/11/2008   AF (paroxysmal atrial fibrillation) ([place]) 03/11/2008   SKIN CANCER, HX OF 03/11/2008   Essential hypertension 06/01/2007   Past Medical History:  Diagnosis Date   AF (paroxysmal atrial fibrillation) ([place])    dx ~ 2006   Depression    GERD (gastroesophageal reflux disease)    occasionally   Hepatitis A 1960   containmented water at school when he was a child   Hyperlipidemia    Hypertension    Seborrheic keratosis 12/2015   R midline upper back   Shingles 12-2013   Skin cancer    Basal cell   Past Surgical History:  Procedure Laterality Date   ANTERIOR CERVICAL DECOMP/DISCECTOMY FUSION N/A 03/13/2013   Procedure: ANTERIOR CERVICAL DECOMPRESSION/DISCECTOMY FUSION. Cervical four-five, Cervical five-six;  Surgeon: Otilio Connors, MD;  Location: [facility] NEURO ORS;  Service: Neurosurgery;  Laterality: N/A;   COLONOSCOPY     COLONOSCOPY W/ POLYPECTOMY  2001   negative 2006;[facility]   CYSTOSCOPY  2004   Negative; done for microscopic hematuria,  asymptomatic   INSERTION OF MESH N/A 10/10/2020   Procedure: INSERTION OF MESH;  Surgeon: Ralene Ok, MD;  Location: [place];  Service: General;  Laterality: N/A;   POLYPECTOMY     TONSILLECTOMY AND ADENOIDECTOMY     VASECTOMY     XI ROBOTIC ASSISTED VENTRAL HERNIA N/A 10/10/2020   Procedure: XI ROBOTIC ASSISTED VENTRAL HERNIA REPAIR WITH MESH;  Surgeon: Ralene Ok, MD;  Location: [place];  Service: General;  Laterality: N/A;   Social History   Tobacco Use   Smoking status: Never   Smokeless tobacco: Never   Tobacco comments:    Smoke rarely in college   Vaping Use   Vaping Use: Never used  Substance Use Topics   Alcohol use: Not Currently    Alcohol/week: 7.0 standard drinks of alcohol    Types: 7 Glasses of wine per week    Comment: socially    Drug use: No   Social History   Socioeconomic History   Marital status: Married    Spouse name: Not on file   Number of children: 1   Years of education: Not on file   Highest education level: Not on file  Occupational History   Occupation: retired-disable, workers comp d/t accident 02-2013    Employer: [employer]  Tobacco Use   Smoking status: Never   Smokeless tobacco: Never   Tobacco comments:    Smoke rarely in college   Vaping Use   Vaping  Use: Never used  Substance and Sexual Activity   Alcohol use: Not Currently    Alcohol/week: 7.0 standard drinks of alcohol    Types: 7 Glasses of wine per week    Comment: socially    Drug use: No   Sexual activity: Not on file  Other Topics Concern   Not on file  Social History Narrative   Daughter: Chief Executive Officer, lives [place]    Social Determinants of Health   Financial Resource Strain: Not on file  Food Insecurity: Not on file  Transportation Needs: Not on file  Physical Activity: Not on file  Stress: Not on file  Social Connections: Not on file  Intimate Partner Violence: Not on file   Family Status  Relation Name Status   Father  Deceased   Mother   Deceased   Annamarie Major  (Not Specified)   PGF  (Not Specified)   Neg Hx  (Not Specified)   Family History  Problem Relation Age of Onset   Alzheimer's disease Father    Alzheimer's disease Mother    Heart attack Paternal Uncle 28       sudden death   Stroke Paternal Grandfather 30   Cancer Neg Hx    Diabetes Neg Hx    Colon cancer Neg Hx    Colon polyps Neg Hx    Esophageal cancer Neg Hx    Rectal cancer Neg Hx    Stomach cancer Neg Hx    No Known Allergies    Review of Systems  Constitutional:  Negative for fever and malaise/fatigue.  HENT:  Negative for congestion.   Eyes:  Negative for blurred vision.  Respiratory:  Negative for shortness of breath.   Cardiovascular:  Negative for chest pain, palpitations and leg swelling.  Gastrointestinal:  Negative for abdominal pain, blood in stool and nausea.  Genitourinary:  Negative for dysuria and frequency.  Musculoskeletal:  Negative for falls.  Skin:  Negative for rash.  Neurological:  Negative for dizziness, loss of consciousness and headaches.  Endo/Heme/Allergies:  Negative for environmental allergies.  Psychiatric/Behavioral:  Negative for depression. The patient is not nervous/anxious.       Objective:     BP 118/78 (BP Location: Left Arm, Patient Position: Sitting, Cuff Size: Normal)   Pulse 73   Temp (!) 97.5 F (36.4 C) (Oral)   Resp 20   Ht '5\' 11"'$  (1.803 m)   Wt 214 lb 6.4 oz (97.3 kg)   SpO2 98%   BMI 29.90 kg/m  BP Readings from Last 3 Encounters:  03/05/22 118/78  03/02/22 126/77  02/27/22 90/62   Wt Readings from Last 3 Encounters:  03/05/22 214 lb 6.4 oz (97.3 kg)  02/27/22 218 lb 11.1 oz (99.2 kg)  02/27/22 216 lb 12.8 oz (98.3 kg)   SpO2 Readings from Last 3 Encounters:  03/05/22 98%  03/02/22 94%  02/20/22 98%      Physical Exam Vitals and nursing note reviewed.  Constitutional:      Appearance: He is well-developed.  HENT:     Head: Normocephalic and atraumatic.  Eyes:      Pupils: Pupils are equal, round, and reactive to light.  Neck:     Thyroid: No thyromegaly.  Cardiovascular:     Rate and Rhythm: Normal rate and regular rhythm.     Heart sounds: No murmur heard. Pulmonary:     Effort: Pulmonary effort is normal. No respiratory distress.     Breath sounds: Normal breath sounds. No wheezing or  rales.  Chest:     Chest wall: No tenderness.  Musculoskeletal:     Cervical back: Normal range of motion and neck supple.     Right hip: Tenderness present. Normal range of motion. Normal strength.     Left hip: Tenderness present. Normal range of motion. Normal strength.     Right foot: Bony tenderness present. No swelling.     Left foot: Bony tenderness present. No swelling.  Skin:    General: Skin is warm and dry.  Neurological:     Mental Status: He is alert and oriented to person, place, and time.  Psychiatric:        Behavior: Behavior normal.        Thought Content: Thought content normal.        Judgment: Judgment normal.      No results found for any visits on 03/05/22.  Last CBC Lab Results  Component Value Date   WBC 9.5 02/28/2022   HGB 12.9 (L) 02/28/2022   HCT 36.8 (L) 02/28/2022   MCV 90.0 02/28/2022   MCH 31.5 02/28/2022   RDW 12.7 02/28/2022   PLT 247 31/49/7026   Last metabolic panel Lab Results  Component Value Date   GLUCOSE 155 (H) 03/02/2022   NA 133 (L) 03/02/2022   K 3.9 03/02/2022   CL 100 03/02/2022   CO2 27 03/02/2022   BUN 19 03/02/2022   CREATININE 0.98 03/02/2022   GFRNONAA >60 03/02/2022   CALCIUM 8.7 (L) 03/02/2022   PHOS 2.6 03/02/2022   PROT 7.0 02/27/2022   ALBUMIN 3.6 02/28/2022   BILITOT 1.4 (H) 02/27/2022   ALKPHOS 40 02/27/2022   AST 24 02/27/2022   ALT 16 02/27/2022   ANIONGAP 6 03/02/2022   Last lipids Lab Results  Component Value Date   CHOL 135 06/04/2021   HDL 42.30 06/04/2021   LDLCALC 75 06/04/2021   TRIG 90.0 06/04/2021   CHOLHDL 3 06/04/2021   Last hemoglobin A1c Lab  Results  Component Value Date   HGBA1C 5.8 06/04/2021   Last thyroid functions Lab Results  Component Value Date   TSH 0.98 05/31/2020   Last vitamin D No results found for: "25OHVITD2", "25OHVITD3", "VD25OH" Last vitamin B12 and Folate No results found for: "VITAMINB12", "FOLATE"    The 10-year ASCVD risk score (Arnett DK, et al., 2019) is: 26.7%    Assessment & Plan:   Problem List Items Addressed This Visit       Unprioritized   Hyponatremia    Recheck today    Chemistry      Component Value Date/Time   NA 133 (L) 03/02/2022 1254   K 3.9 03/02/2022 1254   CL 100 03/02/2022 1254   CO2 27 03/02/2022 1254   BUN 19 03/02/2022 1254   CREATININE 0.98 03/02/2022 1254   CREATININE 1.05 12/07/2021 1511      Component Value Date/Time   CALCIUM 8.7 (L) 03/02/2022 1254   ALKPHOS 40 02/27/2022 1721   AST 24 02/27/2022 1721   ALT 16 02/27/2022 1721   BILITOT 1.4 (H) 02/27/2022 1721           Relevant Orders   CBC with Differential/Platelet   Comprehensive metabolic panel   Hypokalemia - Primary    .Recheck labs        Relevant Orders   CBC with Differential/Platelet   Comprehensive metabolic panel   Essential hypertension    Well controlled, no changes to meds. Encouraged heart healthy diet such as the DASH  diet and exercise as tolerated.        Return if symptoms worsen or fail to improve.    Ann Held, DO

## 2022-03-05 NOTE — Progress Notes (Unsigned)
Cardiology Office Note:    Date:  03/07/2022   ID:  Rodney Sawyer, DOB 06-Dec-1945, MRN [id number]  PCP:  Colon Branch, MD   [facility] Providers Cardiologist:  Jenkins Rouge, MD     Referring MD: Colon Branch, MD   Chief Complaint: hospital follow-up electrolyte disturbance  History of Present Illness:    Rodney Sawyer is a very pleasant  76 y.o. male with a hx of PAF on chronic anticoagulation, HTN, HLD  Referred to cardiology by PCP for evaluation of atrial fibrillation and seen by Dr. Johnsie Cancel 07/26/2014 following an ER visit for palpitations on 06/12/2014 and was found to be in A-fib converted spontaneously. He has maintained consistent follow-up.   Lopressor was reduced to 12.5 mg twice daily at office visit with Dr. Johnsie Cancel 01/02/2021.  He remained on flecainide, diltiazem, and Eliquis for atrial fibrillation.  He was last seen in our office on 01/14/2022 by Nicholes Rough, PA at which time he reported feeling well with only 1 episode of A-fib for which he took flecainide.  States he converted quickly.  He discontinued caffeine and alcohol which he reports has helped significantly with heart rhythm. He was advised to follow-up in 1 year.   Admission 8/16-8/18/23 for generalized weakness, nausea, and decreased appetite x1 week.  Metoprolol had previously been held by PCP for HR 49, soft BP.  Was found to have sodium of 126, hypokalemia with potassium 2.5 felt to be 2/2 decreased p.o. intake. QTc 476 ms on EKG. Hs troponin negative x 2. HCTZ felt to be contributing to electrolyte disturbance, was discontinued.   Today, he is here with his wife for hospital follow-up. He reports that prior to hospitalization, he felt increased palpitations, profound weakness, and no appetite. Was nauseated and noted that HR was low. Went to PCP and metoprolol was reduced. Admission 8/16 for hydration and was found to have hypokalemia and hyponatremia. Is feeling better today. Appetite is better. Will finish  potassium supplement in 5 days. Occasional lightheadedness since hospital discharge. No palpitations and fatigue is much better. Takes flecainide about once yearly for a fib which converts to sinus rhythm within a few hours. When symptoms started about 1 1/2 weeks ago, did not feel like a fib but he took flecainide and symptoms improved.  Possibly PACs/PVCs due to electrolyte disturbance.  He denies chest pain, shortness of breath, orthopnea, PND, presyncope, syncope.  Past Medical History:  Diagnosis Date   AF (paroxysmal atrial fibrillation) ([place])    dx ~ 2006   Depression    GERD (gastroesophageal reflux disease)    occasionally   Hepatitis A 1960   containmented water at school when he was a child   Hyperlipidemia    Hypertension    Seborrheic keratosis 12/2015   R midline upper back   Shingles 12-2013   Skin cancer    Basal cell    Past Surgical History:  Procedure Laterality Date   ANTERIOR CERVICAL DECOMP/DISCECTOMY FUSION N/A 03/13/2013   Procedure: ANTERIOR CERVICAL DECOMPRESSION/DISCECTOMY FUSION. Cervical four-five, Cervical five-six;  Surgeon: Otilio Connors, MD;  Location: [facility] NEURO ORS;  Service: Neurosurgery;  Laterality: N/A;   COLONOSCOPY     COLONOSCOPY W/ POLYPECTOMY  2001   negative 2006;[facility]   CYSTOSCOPY  2004   Negative; done for microscopic hematuria, asymptomatic   INSERTION OF MESH N/A 10/10/2020   Procedure: INSERTION OF MESH;  Surgeon: Ralene Ok, MD;  Location: [place];  Service: General;  Laterality: N/A;  POLYPECTOMY     TONSILLECTOMY AND ADENOIDECTOMY     VASECTOMY     XI ROBOTIC ASSISTED VENTRAL HERNIA N/A 10/10/2020   Procedure: XI ROBOTIC ASSISTED VENTRAL HERNIA REPAIR WITH MESH;  Surgeon: Ralene Ok, MD;  Location: [place];  Service: General;  Laterality: N/A;    Current Medications: Current Meds  Medication Sig   apixaban (ELIQUIS) 5 MG TABS tablet Take 1 tablet (5 mg total) by mouth 2 (two) times daily.   benazepril (LOTENSIN) 20  MG tablet TAKE 1 TABLET(20 MG) BY MOUTH TWICE DAILY   citalopram (CELEXA) 20 MG tablet Take 20 mg by mouth in the morning.   diltiazem (DILT-XR) 180 MG 24 hr capsule TAKE ONE CAPSULE BY MOUTH TWICE DAILY   docusate sodium (COLACE) 100 MG capsule Take 100 mg by mouth daily as needed for mild constipation.   flecainide (TAMBOCOR) 100 MG tablet TAKE 3 TABLETS('300MG'$  TOTAL) BY MOUTH AS NEEDED FOR EPISODE OF AFIB AS DIRECTED   Omega-3 Fatty Acids (FISH OIL) 1200 MG CAPS Take 1,200 mg by mouth daily.   omeprazole (PRILOSEC) 20 MG capsule Take 20 mg by mouth daily as needed (for heartburn).   ondansetron (ZOFRAN) 4 MG tablet Take 1 tablet (4 mg total) by mouth every 8 (eight) hours as needed for nausea or vomiting.   polyethylene glycol powder (GLYCOLAX/MIRALAX) 17 GM/SCOOP powder Take 17 g by mouth daily as needed for mild constipation.   potassium chloride SA (KLOR-CON M) 20 MEQ tablet Take 2 tablets (40 mEq total) by mouth daily.   rosuvastatin (CRESTOR) 20 MG tablet Take 20 mg by mouth every Monday, Wednesday, and Friday.     Allergies:   Patient has no known allergies.   Social History   Socioeconomic History   Marital status: Married    Spouse name: Not on file   Number of children: 1   Years of education: Not on file   Highest education level: Not on file  Occupational History   Occupation: retired-disable, workers comp d/t accident 02-2013    Employer: [employer]  Tobacco Use   Smoking status: Never   Smokeless tobacco: Never   Tobacco comments:    Smoke rarely in college   Vaping Use   Vaping Use: Never used  Substance and Sexual Activity   Alcohol use: Not Currently    Alcohol/week: 7.0 standard drinks of alcohol    Types: 7 Glasses of wine per week    Comment: socially    Drug use: No   Sexual activity: Not on file  Other Topics Concern   Not on file  Social History Narrative   Daughter: Chief Executive Officer, lives [place]    Social Determinants of Health   Financial  Resource Strain: Not on file  Food Insecurity: Not on file  Transportation Needs: Not on file  Physical Activity: Not on file  Stress: Not on file  Social Connections: Not on file     Family History: The patient's family history includes Alzheimer's disease in his father and mother; Heart attack (age of onset: 42) in his paternal uncle; Stroke (age of onset: 15) in his paternal grandfather. There is no history of Cancer, Diabetes, Colon cancer, Colon polyps, Esophageal cancer, Rectal cancer, or Stomach cancer.  ROS:   Please see the history of present illness.    + occasional lightheaded All other systems reviewed and are negative.  Labs/Other Studies Reviewed:    The following studies were reviewed today:  Echo 07/26/14  - Left ventricle:  The cavity size was normal. Wall thickness was    increased in a pattern of mild LVH. Systolic function was normal.    The estimated ejection fraction was in the range of 50% to 55%.    Wall motion was normal; there were no regional wall motion    abnormalities. Doppler parameters are consistent with abnormal    left ventricular relaxation (grade 1 diastolic dysfunction).  - Aortic valve: There was trivial regurgitation.  - Ascending aorta: The ascending aorta was mildly dilated.  - Left atrium: The atrium was mildly dilated.   Impressions:   - Normal LV function; mild LVH; grade 1 diastolic dysfunction; mild    LAE; trace MR, TR and AI.    Recent Labs: 03/02/2022: Magnesium 2.1 03/05/2022: ALT 15; BUN 14; Creatinine, Ser 0.97; Hemoglobin 14.4; Platelets 272.0; Potassium 4.8; Sodium 129  Recent Lipid Panel    Component Value Date/Time   CHOL 135 06/04/2021 1440   CHOL 156 01/04/2014 1204   TRIG 90.0 06/04/2021 1440   TRIG 182 (H) 01/04/2014 1204   HDL 42.30 06/04/2021 1440   HDL 37 (L) 01/04/2014 1204   CHOLHDL 3 06/04/2021 1440   VLDL 18.0 06/04/2021 1440   LDLCALC 75 06/04/2021 1440   LDLCALC 83 01/04/2014 1204     Risk  Assessment/Calculations:       Physical Exam:    VS:  BP 100/70 (BP Location: Left Arm, Patient Position: Sitting, Cuff Size: Normal)   Pulse 67   Ht '5\' 11"'$  (1.803 m)   Wt 217 lb (98.4 kg)   SpO2 95%   BMI 30.27 kg/m     Wt Readings from Last 3 Encounters:  03/06/22 217 lb (98.4 kg)  03/05/22 214 lb 6.4 oz (97.3 kg)  02/27/22 218 lb 11.1 oz (99.2 kg)     GEN: Well nourished, well developed in no acute distress HEENT: Normal NECK: No JVD; No carotid bruits CARDIAC: RRR, no murmurs, rubs, gallops RESPIRATORY:  Clear to auscultation without rales, wheezing or rhonchi  ABDOMEN: Soft, non-tender, non-distended MUSCULOSKELETAL:  No edema; No deformity. 2+ pedal pulses, equal bilaterally SKIN: Warm and dry NEUROLOGIC:  Alert and oriented x 3 PSYCHIATRIC:  Normal affect   EKG:  EKG is ordered today.  The ekg ordered today demonstrates NSR at 67 bpm, LAFB, no acute ST abnormality, normal QTc   Diagnoses:    1. PAF (paroxysmal atrial fibrillation) (H[facility])   2. QT prolongation   3. Primary hypertension    Assessment and Plan:     PAF on chronic anticoagulation: Rare episodes of atrial fibrillation for which he takes flecainide 300 mg as needed.  He asked about additional treatment if needed in the future. Has some family members who have undergone ablation. Advised him to notify us if episodes occur more frequently or if he has worsening symptoms or if he would like a referral with EP to discuss ablation. No bleeding problems on Eliquis. Continue diltiazem and Eliquis and prn flecainide.   Electrolyte disturbance: Recent hospital admission for weakness, found to be hyponatremic and hypokalemic. Thought to be 2/2 HCTZ, poor appetite for several days prior. Lab work with PCP on 8/22 with K+ 4.8 and Na 129. Slight decrease in sodium from 5 days prior.  Lengthy discussion about liberalizing salt and limiting water intake to less than 1 L. May have additional fluids for total of < 2 L  daily. Recently started drinking gatorade. He will complete potassium supplements and is having repeat lab work on  8/25. Advised him to notify us of symptoms of palpitations or other concerns.   QTc prolongation: In the setting of recent electrolyte disturbance.  Has normalized on EKG today. Feels lightheaded on occasion, more with position change. Denies significant lightheadedness, presyncope, syncope. Sodium and potassium have improved on lab work 8/22. Electrolytes will continue to be monitored closely. Advised him to notify us of any concerning symptoms prior to next appointment. He is on Celexa which can also cause prolongation. With improvement of symptoms of nausea, no longer using ondanestron and takes flecainide on rare basis. No indication to d/c Celexa at this time.      Disposition: 6 months with Dr. Johnsie Cancel  Medication Adjustments/Labs and Tests Ordered: Current medicines are reviewed at length with the patient today.  Concerns regarding medicines are outlined above.  Orders Placed This Encounter  Procedures   EKG 12-Lead   No orders of the defined types were placed in this encounter.   Patient Instructions  Medication Instructions:   Your physician recommends that you continue on your current medications as directed. Please refer to the Current Medication list given to you today.  *If you need a refill on your cardiac medications before your next appointment, please call your pharmacy*   Lab Work:  None ordered.  If you have labs (blood work) drawn today and your tests are completely normal, you will receive your results only by: [place] (if you have MyChart) OR A paper copy in the mail If you have any lab test that is abnormal or we need to change your treatment, we will call you to review the results.   Testing/Procedures:  None ordered.   Follow-Up: At [facility], you and your health needs are our priority.  As part of our continuing mission to  provide you with exceptional heart care, we have created designated Provider Care Teams.  These Care Teams include your primary Cardiologist (physician) and Advanced Practice Providers (APPs -  Physician Assistants and Nurse Practitioners) who all work together to provide you with the care you need, when you need it.  We recommend signing up for the patient portal called "MyChart".  Sign up information is provided on this After Visit Summary.  MyChart is used to connect with patients for Virtual Visits (Telemedicine).  Patients are able to view lab/test results, encounter notes, upcoming appointments, etc.  Non-urgent messages can be sent to your provider as well.   To learn more about what you can do with MyChart, go to [email or web address].    Your next appointment:   6 month(s)  The format for your next appointment:   In Person  Provider:   Jenkins Rouge, MD     Other Instructions   Do Not Exceed 1 liter of water per day.  You may have total fluids (Water, Juice, and Gatorade) 2 liter daily.   Important Information About Sugar         Signed, Emmaline Life, NP  03/07/2022 9:18 AM    [place]

## 2022-03-06 ENCOUNTER — Encounter: Payer: Self-pay | Admitting: Nurse Practitioner

## 2022-03-06 ENCOUNTER — Ambulatory Visit: Payer: Medicare Other | Admitting: Nurse Practitioner

## 2022-03-06 VITALS — BP 100/70 | HR 67 | Ht 71.0 in | Wt 217.0 lb

## 2022-03-06 DIAGNOSIS — I10 Essential (primary) hypertension: Secondary | ICD-10-CM | POA: Diagnosis not present

## 2022-03-06 DIAGNOSIS — R9431 Abnormal electrocardiogram [ECG] [EKG]: Secondary | ICD-10-CM | POA: Diagnosis not present

## 2022-03-06 DIAGNOSIS — I480 Paroxysmal atrial fibrillation: Secondary | ICD-10-CM | POA: Diagnosis not present

## 2022-03-06 DIAGNOSIS — R001 Bradycardia, unspecified: Secondary | ICD-10-CM

## 2022-03-06 NOTE — Patient Instructions (Signed)
Medication Instructions:   Your physician recommends that you continue on your current medications as directed. Please refer to the Current Medication list given to you today.  *If you need a refill on your cardiac medications before your next appointment, please call your pharmacy*   Lab Work:  None ordered.  If you have labs (blood work) drawn today and your tests are completely normal, you will receive your results only by: [place] (if you have MyChart) OR A paper copy in the mail If you have any lab test that is abnormal or we need to change your treatment, we will call you to review the results.   Testing/Procedures:  None ordered.   Follow-Up: At [facility], you and your health needs are our priority.  As part of our continuing mission to provide you with exceptional heart care, we have created designated Provider Care Teams.  These Care Teams include your primary Cardiologist (physician) and Advanced Practice Providers (APPs -  Physician Assistants and Nurse Practitioners) who all work together to provide you with the care you need, when you need it.  We recommend signing up for the patient portal called "MyChart".  Sign up information is provided on this After Visit Summary.  MyChart is used to connect with patients for Virtual Visits (Telemedicine).  Patients are able to view lab/test results, encounter notes, upcoming appointments, etc.  Non-urgent messages can be sent to your provider as well.   To learn more about what you can do with MyChart, go to [email or web address].    Your next appointment:   6 month(s)  The format for your next appointment:   In Person  Provider:   Jenkins Rouge, MD     Other Instructions   Do Not Exceed 1 liter of water per day.  You may have total fluids (Water, Juice, and Gatorade) 2 liter daily.   Important Information About Sugar

## 2022-03-07 ENCOUNTER — Encounter: Payer: Self-pay | Admitting: Nurse Practitioner

## 2022-03-07 ENCOUNTER — Telehealth: Payer: Self-pay

## 2022-03-07 NOTE — Telephone Encounter (Signed)
Pt's wife called and stated they went to cardiology went yesterday and they told him to stop taking the potassium , and you told him to continue at his appointment on Tuesday , they want to know which recommendations to follow .           Pamala Hurry   [phone]

## 2022-03-07 NOTE — Telephone Encounter (Signed)
Pt.notified

## 2022-03-08 ENCOUNTER — Other Ambulatory Visit (INDEPENDENT_AMBULATORY_CARE_PROVIDER_SITE_OTHER): Payer: Medicare Other

## 2022-03-08 DIAGNOSIS — E871 Hypo-osmolality and hyponatremia: Secondary | ICD-10-CM

## 2022-03-08 DIAGNOSIS — E876 Hypokalemia: Secondary | ICD-10-CM

## 2022-03-08 LAB — COMPREHENSIVE METABOLIC PANEL
ALT: 14 U/L (ref 0–53)
AST: 13 U/L (ref 0–37)
Albumin: 4.1 g/dL (ref 3.5–5.2)
Alkaline Phosphatase: 42 U/L (ref 39–117)
BUN: 12 mg/dL (ref 6–23)
CO2: 26 mEq/L (ref 19–32)
Calcium: 8.8 mg/dL (ref 8.4–10.5)
Chloride: 98 mEq/L (ref 96–112)
Creatinine, Ser: 1.03 mg/dL (ref 0.40–1.50)
GFR: 70.21 mL/min (ref >60.00)
Glucose, Bld: 152 mg/dL — ABNORMAL HIGH (ref 70–99)
Potassium: 4.1 mEq/L (ref 3.5–5.1)
Sodium: 132 mEq/L — ABNORMAL LOW (ref 135–145)
Total Bilirubin: 1 mg/dL (ref 0.2–1.2)
Total Protein: 6.4 g/dL (ref 6.0–8.3)

## 2022-03-11 ENCOUNTER — Ambulatory Visit: Payer: Medicare Other | Attending: Internal Medicine | Admitting: Physical Therapy

## 2022-03-11 ENCOUNTER — Encounter: Payer: Self-pay | Admitting: Physical Therapy

## 2022-03-11 DIAGNOSIS — M6281 Muscle weakness (generalized): Secondary | ICD-10-CM | POA: Diagnosis not present

## 2022-03-11 DIAGNOSIS — R269 Unspecified abnormalities of gait and mobility: Secondary | ICD-10-CM | POA: Insufficient documentation

## 2022-03-11 DIAGNOSIS — M47812 Spondylosis without myelopathy or radiculopathy, cervical region: Secondary | ICD-10-CM | POA: Insufficient documentation

## 2022-03-11 DIAGNOSIS — R293 Abnormal posture: Secondary | ICD-10-CM | POA: Insufficient documentation

## 2022-03-11 DIAGNOSIS — R2681 Unsteadiness on feet: Secondary | ICD-10-CM | POA: Insufficient documentation

## 2022-03-11 DIAGNOSIS — R262 Difficulty in walking, not elsewhere classified: Secondary | ICD-10-CM | POA: Diagnosis not present

## 2022-03-11 DIAGNOSIS — R278 Other lack of coordination: Secondary | ICD-10-CM | POA: Diagnosis not present
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
## 2022-03-12 ENCOUNTER — Encounter: Payer: Self-pay | Admitting: Family Medicine

## 2022-03-12 ENCOUNTER — Ambulatory Visit (INDEPENDENT_AMBULATORY_CARE_PROVIDER_SITE_OTHER): Payer: Medicare Other | Admitting: Family Medicine

## 2022-03-12 VITALS — BP 140/92 | HR 74 | Temp 97.7°F | Resp 18 | Ht 71.0 in | Wt 214.2 lb

## 2022-03-12 DIAGNOSIS — E871 Hypo-osmolality and hyponatremia: Secondary | ICD-10-CM

## 2022-03-12 DIAGNOSIS — E876 Hypokalemia: Secondary | ICD-10-CM | POA: Diagnosis not present

## 2022-03-12 DIAGNOSIS — R739 Hyperglycemia, unspecified: Secondary | ICD-10-CM | POA: Diagnosis not present

## 2022-03-12 DIAGNOSIS — I10 Essential (primary) hypertension: Secondary | ICD-10-CM | POA: Diagnosis not present

## 2022-03-12 DIAGNOSIS — R531 Weakness: Secondary | ICD-10-CM

## 2022-03-12 NOTE — Assessment & Plan Note (Deleted)
Off diuretics and BB Recheck labs today F/u pcp as scheduled

## 2022-03-12 NOTE — Assessment & Plan Note (Signed)
Chemistry      Component Value Date/Time   NA 132 (L) 03/08/2022 1010   K 4.1 03/08/2022 1010   CL 98 03/08/2022 1010   CO2 26 03/08/2022 1010   BUN 12 03/08/2022 1010   CREATININE 1.03 03/08/2022 1010   CREATININE 1.05 12/07/2021 1511      Component Value Date/Time   CALCIUM 8.8 03/08/2022 1010   ALKPHOS 42 03/08/2022 1010   AST 13 03/08/2022 1010   ALT 14 03/08/2022 1010   BILITOT 1.0 03/08/2022 1010    Off potassium

## 2022-03-12 NOTE — Patient Instructions (Signed)
Hyponatremia Hyponatremia is when the amount of salt (sodium) in a person's blood is too low. When sodium levels are low, the cells absorb extra water, which causes them to swell. The swelling happens throughout the body, but it mostly affects the brain. What are the causes? This condition may be caused by: Certain medical conditions, such as: Heart, kidney, or liver problems. Thyroid problems. Adrenal gland problems. Metabolic conditions, such as Addison's disease or syndrome of inappropriate antidiuresis (SIAD). Excessive vomiting, diarrhea, or sweating. Certain medicines or illegal drugs. Fluids given through an IV. What increases the risk? You are more likely to develop this condition if you: Have certain medical conditions such as heart, kidney, or liver failure. Have a medical condition that causes frequent or excessive diarrhea. Participate in intense physical activities, such as marathon running. Take certain medicines that affect the sodium and fluid balance in the blood. Some of these medicine types include: Diuretics. NSAIDs, such as ibuprofen. Some opioid pain medicines. Some antidepressants. Some seizure prevention medicines. What are the signs or symptoms? Symptoms of this condition include: Headache. Nausea and vomiting. Being very tired (lethargic). Muscle weakness and cramping. Loss of appetite. Feeling weak or light-headed. Severe symptoms of this condition include: Confusion. Agitation. Having a rapid heart rate. Fainting. Seizures. Coma. How is this diagnosed? This condition is diagnosed based on: A physical exam. Your medical history. Tests, including: Blood tests. Urine tests. How is this treated? Treatment for this condition depends on the cause. Treatment may include: Getting fluids through an IV that is inserted into one of your veins. Medicines to correct the sodium imbalance. If medicines are causing the condition, the medicines will need to  be adjusted. Limiting your water or fluid intake to get the correct sodium balance, in certain cases. Monitoring in the hospital to closely watch your symptoms for improvement. Follow these instructions at home:  Take over-the-counter and prescription medicines only as told by your health care provider. Many medicines can make this condition worse. Talk with your health care provider about any medicines that you are currently taking. Do not drink alcohol. Keep all follow-up visits. This is important. Contact a health care provider if: You develop worsening nausea, fatigue, headache, confusion, or weakness. Your symptoms go away and then return. Get help right away if: You have a seizure. You faint. You have ongoing diarrhea or vomiting. Summary Hyponatremia is when the amount of salt (sodium) in your blood is too low. When sodium levels are low, your cells absorb extra water, which causes them to swell. The swelling happens throughout the body, but it mostly affects the brain. Treatment for this condition depends on the cause. It may include receiving IV fluids, taking or adjusting medicines, limiting fluid intake, and monitoring in the hospital. This information is not intended to replace advice given to you by your health care provider. Make sure you discuss any questions you have with your health care provider. Document Revised: 01/09/2021 Document Reviewed: 01/09/2021 Elsevier Patient Education  [place].

## 2022-03-12 NOTE — Assessment & Plan Note (Signed)
Off diuretics and BB Recheck labs today F/u pcp as scheduled

## 2022-03-12 NOTE — Assessment & Plan Note (Signed)
Chemistry      Component Value Date/Time   NA 132 (L) 03/08/2022 1010   K 4.1 03/08/2022 1010   CL 98 03/08/2022 1010   CO2 26 03/08/2022 1010   BUN 12 03/08/2022 1010   CREATININE 1.03 03/08/2022 1010   CREATININE 1.05 12/07/2021 1511      Component Value Date/Time   CALCIUM 8.8 03/08/2022 1010   ALKPHOS 42 03/08/2022 1010   AST 13 03/08/2022 1010   ALT 14 03/08/2022 1010   BILITOT 1.0 03/08/2022 1010     Recheck today---- con't with fluid restriction
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
## 2022-03-12 NOTE — Assessment & Plan Note (Signed)
Recheck today with a1c  Watch simple sugars and starches

## 2022-03-13 LAB — COMPREHENSIVE METABOLIC PANEL
ALT: 13 U/L (ref 0–53)
AST: 15 U/L (ref 0–37)
Albumin: 4.2 g/dL (ref 3.5–5.2)
Alkaline Phosphatase: 43 U/L (ref 39–117)
BUN: 10 mg/dL (ref 6–23)
CO2: 26 mEq/L (ref 19–32)
Calcium: 8.9 mg/dL (ref 8.4–10.5)
Chloride: 97 mEq/L (ref 96–112)
Creatinine, Ser: 0.86 mg/dL (ref 0.40–1.50)
GFR: 83.68 mL/min (ref >60.00)
Glucose, Bld: 77 mg/dL (ref 70–99)
Potassium: 4.4 mEq/L (ref 3.5–5.1)
Sodium: 132 mEq/L — ABNORMAL LOW (ref 135–145)
Total Bilirubin: 1 mg/dL (ref 0.2–1.2)
Total Protein: 6.8 g/dL (ref 6.0–8.3)

## 2022-03-13 LAB — HEMOGLOBIN A1C: Hgb A1c MFr Bld: 5.7 % (ref 4.6–6.5)

## 2022-03-15 ENCOUNTER — Ambulatory Visit: Payer: Medicare Other | Attending: Internal Medicine | Admitting: Physical Therapy

## 2022-03-15 DIAGNOSIS — M6281 Muscle weakness (generalized): Secondary | ICD-10-CM | POA: Insufficient documentation

## 2022-03-15 DIAGNOSIS — R278 Other lack of coordination: Secondary | ICD-10-CM | POA: Insufficient documentation

## 2022-03-15 DIAGNOSIS — R293 Abnormal posture: Secondary | ICD-10-CM | POA: Insufficient documentation

## 2022-03-15 DIAGNOSIS — R2681 Unsteadiness on feet: Secondary | ICD-10-CM | POA: Insufficient documentation

## 2022-03-15 DIAGNOSIS — R262 Difficulty in walking, not elsewhere classified: Secondary | ICD-10-CM | POA: Insufficient documentation
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
## 2022-03-19 ENCOUNTER — Other Ambulatory Visit: Payer: Self-pay

## 2022-03-19 DIAGNOSIS — R531 Weakness: Secondary | ICD-10-CM

## 2022-03-20 ENCOUNTER — Ambulatory Visit: Payer: Medicare Other | Admitting: Physical Therapy

## 2022-03-20 ENCOUNTER — Encounter: Payer: Self-pay | Admitting: Physical Therapy

## 2022-03-20 DIAGNOSIS — R2681 Unsteadiness on feet: Secondary | ICD-10-CM | POA: Diagnosis not present

## 2022-03-20 DIAGNOSIS — M6281 Muscle weakness (generalized): Secondary | ICD-10-CM

## 2022-03-20 DIAGNOSIS — R293 Abnormal posture: Secondary | ICD-10-CM

## 2022-03-20 DIAGNOSIS — R278 Other lack of coordination: Secondary | ICD-10-CM | POA: Diagnosis not present

## 2022-03-20 DIAGNOSIS — R262 Difficulty in walking, not elsewhere classified: Secondary | ICD-10-CM | POA: Diagnosis not present
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
## 2022-03-25 ENCOUNTER — Ambulatory Visit: Payer: Medicare Other | Admitting: Physical Therapy

## 2022-03-25 ENCOUNTER — Telehealth: Payer: Self-pay

## 2022-03-25 ENCOUNTER — Ambulatory Visit (INDEPENDENT_AMBULATORY_CARE_PROVIDER_SITE_OTHER): Payer: Medicare Other | Admitting: Family

## 2022-03-25 VITALS — BP 85/56 | HR 63 | Temp 97.7°F | Resp 16 | Wt 209.0 lb

## 2022-03-25 DIAGNOSIS — E876 Hypokalemia: Secondary | ICD-10-CM | POA: Diagnosis not present

## 2022-03-25 DIAGNOSIS — I10 Essential (primary) hypertension: Secondary | ICD-10-CM | POA: Diagnosis not present

## 2022-03-25 LAB — BASIC METABOLIC PANEL
BUN: 10 mg/dL (ref 6–23)
CO2: 26 mEq/L (ref 19–32)
Calcium: 8.9 mg/dL (ref 8.4–10.5)
Chloride: 91 mEq/L — ABNORMAL LOW (ref 96–112)
Creatinine, Ser: 1.07 mg/dL (ref 0.40–1.50)
GFR: 67.05 mL/min (ref >60.00)
Glucose, Bld: 109 mg/dL — ABNORMAL HIGH (ref 70–99)
Potassium: 3.6 mEq/L (ref 3.5–5.1)
Sodium: 128 mEq/L — ABNORMAL LOW (ref 135–145)

## 2022-03-25 NOTE — Patient Instructions (Signed)
Stop HCTZ. Stop Metoprolol.  Take blood pressure once daily for the next few evenings. Send me your readings via mychart.

## 2022-03-25 NOTE — Telephone Encounter (Signed)
Patient scheduled with Rodney Alar, NP today (03/25/22).    [facility] [place] Night - Client TELEPHONE ADVICE RECORDAccessNurse Patient Name:Rodney Sawyer  Reason for Call Request to Schedule Office Appointment Initial Comment Caller states bp is high and went down. She wants to cancel her appt and pt needs an appt. He was given medicine and is weak. Translation No Nurse Assessment Nurse: Windle Guard, RN, Lesa Date/Time (Eastern Time): 03/25/2022 8:10:03 AM Confirm and document reason for call. If symptomatic, describe symptoms. ---Caller states husband's blood pressure is 135/92. He is wanting to be seen today. He is weak Does the patient have any new or worsening symptoms? ---Yes Will a triage be completed? ---Yes Related visit to physician within the last 2 weeks? ---Yes Does the PT have any chronic conditions? (i.e. diabetes, asthma, this includes High risk factors for pregnancy, etc.) ---Yes List chronic conditions. ---HTN, high cholesterol, A fib, low potassium Is this a behavioral health or substance abuse call? ---No Guidelines Guideline Title Affirmed Question Affirmed Notes Nurse Date/Time (Eastern Time) Blood Pressure - High [1] Taking BP medications AND [2] feels is having side effects (e.g., impotence, cough, dizzy upon standing) Conner, RN, Lesa 03/25/2022 8:12:35 AM Disp. Time Eilene Ghazi Time) Disposition Final User 03/25/2022 8:15:19 AM SEE PCP WITHIN 3 DAYS Yes Conner, RN, Lesa PLEASE NOTE: All timestamps contained within this report are represented as [place] Standard Time. CONFIDENTIALTY NOTICE: This fax transmission is intended only for the addressee. It contains information that is legally privileged, confidential or otherwise protected from use or disclosure. If you are not the intended recipient, you are strictly prohibited from reviewing, disclosing, copying using or disseminating any of this information or taking any action in reliance on  or regarding this information. If you have received this fax in error, please notify us immediately by telephone so that we can arrange for its return to [place]. Phone: [phone], Toll-Free: [phone], Fax: [phone] Page: 2 of 2 Call Id: [id number] Final Disposition 03/25/2022 8:15:19 AM SEE PCP WITHIN 3 DAYS Yes Conner, RN, Emmaline Kluver Caller Disagree/Comply Comply Caller Understands Yes PreDisposition Go to ED Care Advice Given Per Guideline SEE PCP WITHIN 3 DAYS: * You need to be seen within 2 or 3 days. * The goal of blood pressure treatment depends on your age and if you have other health problems. A general goal is less than 130/80. But you should talk to your doctor about a goal that is right for you. CALL BACK IF: * Weakness or numbness of the face, arm or leg on one side of the body occurs * Difficulty walking, difficulty talking, or severe headache occurs * Chest pain or difficulty breathing occurs CARE ADVICE given per High Blood Pressure (Adult) guideline. * You become worse Comments User: Rodney Blue, RN Date/Time Eilene Ghazi Time): 03/25/2022 8:19:38 AM Call was warm transferred to the office
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
## 2022-03-26 ENCOUNTER — Ambulatory Visit: Payer: Medicare Other | Admitting: Physical Therapy

## 2022-03-26 NOTE — Assessment & Plan Note (Signed)
BP is very over treated secondary to patient self dosing discontinued medications.  I urged him not to make any changes to his recommended medications without checking with [place] first.  In the meantime, he is advised not to take his scheduled PM dose of lisinopril/diltiazem tonight but to resume them in the AM. Remain off of metoprolol and HCTZ. HCTZ was apparently discontinued due to hyponatremia/hypokalemia- check bmet.  I think he is checking his blood pressure too often at home and is making himself anxious about these readings.  Advised pt that it is safer for him to have an occasional elevated bp reading of sbp 150-160, than it is to have his blood pressure as low as it is today.  Wife wants to know what to do to keep it from going too high.  I advised them to check his bp once daily for then next 3 days at home and send me his readings via mychart for further recommendations.  Will need to see where his bp is as well as his electrolytes prior to making any definite decisions to change his regimen.

## 2022-03-27 ENCOUNTER — Encounter: Payer: Self-pay | Admitting: Family

## 2022-03-28 ENCOUNTER — Encounter: Payer: Self-pay | Admitting: Physical Therapy

## 2022-03-28 ENCOUNTER — Ambulatory Visit: Payer: Medicare Other | Admitting: Physical Therapy

## 2022-03-28 DIAGNOSIS — M6281 Muscle weakness (generalized): Secondary | ICD-10-CM

## 2022-03-28 DIAGNOSIS — R278 Other lack of coordination: Secondary | ICD-10-CM | POA: Diagnosis not present

## 2022-03-28 DIAGNOSIS — R2681 Unsteadiness on feet: Secondary | ICD-10-CM

## 2022-03-28 DIAGNOSIS — R293 Abnormal posture: Secondary | ICD-10-CM

## 2022-03-28 DIAGNOSIS — R262 Difficulty in walking, not elsewhere classified: Secondary | ICD-10-CM

## 2022-03-28 NOTE — Telephone Encounter (Signed)
Dr. Larose Kells, [facility].
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
## 2022-03-29 ENCOUNTER — Telehealth: Payer: Self-pay | Admitting: Internal Medicine

## 2022-03-29 MED ORDER — BENAZEPRIL HCL 20 MG PO TABS: 20.0000 mg | ORAL_TABLET | Freq: Every day | ORAL | Status: DC

## 2022-03-29 NOTE — Telephone Encounter (Signed)
Was seen 03/25/2022, BP was low.  Was recommended to skip second dose of lisinopril and diltiazem x1. Subsequently patient sent a message, and BP was still low. At this point, he is no longer taking metoprolol or HCTZ.  Only on benazepril diltiazem. I spoke with the patient today, most recent BP is 100/70. Risk of overcontrolled BP > occasional elevated BP, patient verbalized understanding Plan: - Decrease benazepril to 20 mg once a day only - Continue diltiazem twice daily - Monitor BPs and call me next week.

## 2022-03-30 ENCOUNTER — Encounter: Payer: Self-pay | Admitting: Internal Medicine

## 2022-03-31 ENCOUNTER — Encounter: Payer: Self-pay | Admitting: Internal Medicine

## 2022-04-01 ENCOUNTER — Ambulatory Visit: Payer: Medicare Other | Admitting: Physical Therapy

## 2022-04-04 ENCOUNTER — Ambulatory Visit: Payer: Medicare Other | Admitting: Physical Therapy

## 2022-04-04 ENCOUNTER — Encounter: Payer: Self-pay | Admitting: Physical Therapy

## 2022-04-04 DIAGNOSIS — M6281 Muscle weakness (generalized): Secondary | ICD-10-CM

## 2022-04-04 DIAGNOSIS — R278 Other lack of coordination: Secondary | ICD-10-CM | POA: Diagnosis not present

## 2022-04-04 DIAGNOSIS — R293 Abnormal posture: Secondary | ICD-10-CM | POA: Diagnosis not present

## 2022-04-04 DIAGNOSIS — R262 Difficulty in walking, not elsewhere classified: Secondary | ICD-10-CM | POA: Diagnosis not present

## 2022-04-04 DIAGNOSIS — R2681 Unsteadiness on feet: Secondary | ICD-10-CM | POA: Diagnosis not present
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
## 2022-04-08 ENCOUNTER — Encounter: Payer: Self-pay | Admitting: Physical Therapy

## 2022-04-08 ENCOUNTER — Ambulatory Visit: Payer: Medicare Other | Admitting: Physical Therapy

## 2022-04-08 DIAGNOSIS — R293 Abnormal posture: Secondary | ICD-10-CM | POA: Diagnosis not present

## 2022-04-08 DIAGNOSIS — R262 Difficulty in walking, not elsewhere classified: Secondary | ICD-10-CM | POA: Diagnosis not present

## 2022-04-08 DIAGNOSIS — R2681 Unsteadiness on feet: Secondary | ICD-10-CM

## 2022-04-08 DIAGNOSIS — M6281 Muscle weakness (generalized): Secondary | ICD-10-CM

## 2022-04-08 DIAGNOSIS — R278 Other lack of coordination: Secondary | ICD-10-CM | POA: Diagnosis not present
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
## 2022-04-09 ENCOUNTER — Ambulatory Visit (INDEPENDENT_AMBULATORY_CARE_PROVIDER_SITE_OTHER): Payer: Medicare Other | Admitting: Internal Medicine

## 2022-04-09 ENCOUNTER — Encounter: Payer: Self-pay | Admitting: Internal Medicine

## 2022-04-09 VITALS — BP 126/76 | HR 76 | Temp 98.0°F | Resp 18 | Ht 71.0 in | Wt 213.0 lb

## 2022-04-09 DIAGNOSIS — R5383 Other fatigue: Secondary | ICD-10-CM | POA: Diagnosis not present

## 2022-04-09 DIAGNOSIS — I10 Essential (primary) hypertension: Secondary | ICD-10-CM

## 2022-04-09 DIAGNOSIS — E876 Hypokalemia: Secondary | ICD-10-CM
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
## 2022-04-09 NOTE — Patient Instructions (Addendum)
Vaccines I recommend:  Covid booster RSV vaccine  Check the  blood pressure regularly BP GOAL is between 110/65 and  135/85. If it is consistently higher or lower, let me know    GO TO THE LAB : Get the blood work     Your next appointment is already scheduled for November

## 2022-04-10 LAB — BASIC METABOLIC PANEL
BUN: 12 mg/dL (ref 6–23)
CO2: 26 mEq/L (ref 19–32)
Calcium: 8.6 mg/dL (ref 8.4–10.5)
Chloride: 101 mEq/L (ref 96–112)
Creatinine, Ser: 1.04 mg/dL (ref 0.40–1.50)
GFR: 69.36 mL/min (ref >60.00)
Glucose, Bld: 129 mg/dL — ABNORMAL HIGH (ref 70–99)
Potassium: 3.9 mEq/L (ref 3.5–5.1)
Sodium: 135 mEq/L (ref 135–145)

## 2022-04-10 LAB — TSH: TSH: 0.56 u[IU]/mL (ref 0.35–5.50)

## 2022-04-10 NOTE — Assessment & Plan Note (Signed)
HTN:  Recently, BP was low, in part due to self-  management. HCTZ discontinue, benazepril dose decrease to 1 tablet daily, he continue diltiazem twice a day. Ambulatory BPs in the last few days: 130/81, 140/86, 152/91, 151/95, 126/76.  Heart rate in the 70s. Plan: Continue present care, continue monitoring BPs, call for med adjustment only if BPs are consistently high or low. A. Fibrillation: Last seen by cardiology 03/03/2022, anticoagulated, he seems to be tolerating well. Hypokalemia, hyponatremia:  Has been an issue recently, cardiology recommended to increase salt intake and limit free water.  Last last BMP 03/25/2022 showed hyponatremia, currently he is  off HCTZ.  Suspect that without diuretics problem will be corrected.  Recommend to go back to normal/low-salt diet and avoid excessive fluids. Fatigue: Wife requests TSH.  Will do RTC scheduled for November CPX

## 2022-04-11 ENCOUNTER — Ambulatory Visit: Payer: Medicare Other | Admitting: Physical Therapy

## 2022-04-12 ENCOUNTER — Encounter: Payer: Self-pay | Admitting: Physical Therapy

## 2022-04-12 ENCOUNTER — Ambulatory Visit: Payer: Medicare Other | Admitting: Physical Therapy

## 2022-04-12 DIAGNOSIS — M6281 Muscle weakness (generalized): Secondary | ICD-10-CM

## 2022-04-12 DIAGNOSIS — R278 Other lack of coordination: Secondary | ICD-10-CM | POA: Diagnosis not present

## 2022-04-12 DIAGNOSIS — R2681 Unsteadiness on feet: Secondary | ICD-10-CM

## 2022-04-12 DIAGNOSIS — R293 Abnormal posture: Secondary | ICD-10-CM

## 2022-04-12 DIAGNOSIS — R262 Difficulty in walking, not elsewhere classified: Secondary | ICD-10-CM
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
## 2022-04-15 ENCOUNTER — Ambulatory Visit: Payer: Medicare Other | Admitting: Physical Therapy

## 2022-04-16 ENCOUNTER — Encounter: Payer: Self-pay | Admitting: Physical Therapy

## 2022-04-16 ENCOUNTER — Ambulatory Visit: Payer: Medicare Other | Attending: Internal Medicine | Admitting: Physical Therapy

## 2022-04-16 DIAGNOSIS — R293 Abnormal posture: Secondary | ICD-10-CM | POA: Diagnosis not present

## 2022-04-16 DIAGNOSIS — R262 Difficulty in walking, not elsewhere classified: Secondary | ICD-10-CM | POA: Insufficient documentation

## 2022-04-16 DIAGNOSIS — R278 Other lack of coordination: Secondary | ICD-10-CM | POA: Insufficient documentation

## 2022-04-16 DIAGNOSIS — M6281 Muscle weakness (generalized): Secondary | ICD-10-CM | POA: Insufficient documentation

## 2022-04-16 DIAGNOSIS — R2681 Unsteadiness on feet: Secondary | ICD-10-CM | POA: Insufficient documentation
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
## 2022-04-17 ENCOUNTER — Encounter: Payer: Self-pay | Admitting: Internal Medicine

## 2022-04-18 ENCOUNTER — Ambulatory Visit: Payer: Medicare Other | Admitting: Physical Therapy

## 2022-04-18 ENCOUNTER — Encounter: Payer: Self-pay | Admitting: Physical Therapy

## 2022-04-18 DIAGNOSIS — R293 Abnormal posture: Secondary | ICD-10-CM

## 2022-04-18 DIAGNOSIS — R262 Difficulty in walking, not elsewhere classified: Secondary | ICD-10-CM

## 2022-04-18 DIAGNOSIS — M6281 Muscle weakness (generalized): Secondary | ICD-10-CM

## 2022-04-18 DIAGNOSIS — R2681 Unsteadiness on feet: Secondary | ICD-10-CM | POA: Diagnosis not present

## 2022-04-18 DIAGNOSIS — R278 Other lack of coordination: Secondary | ICD-10-CM | POA: Diagnosis not present
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
## 2022-04-22 ENCOUNTER — Ambulatory Visit: Payer: Medicare Other | Admitting: Internal Medicine

## 2022-04-22 ENCOUNTER — Ambulatory Visit: Payer: Medicare Other | Admitting: Diagnostic Neuroimaging

## 2022-04-23 ENCOUNTER — Ambulatory Visit: Payer: Medicare Other | Admitting: Physical Therapy

## 2022-04-23 ENCOUNTER — Encounter: Payer: Self-pay | Admitting: Physical Therapy

## 2022-04-23 DIAGNOSIS — M6281 Muscle weakness (generalized): Secondary | ICD-10-CM | POA: Diagnosis not present

## 2022-04-23 DIAGNOSIS — R293 Abnormal posture: Secondary | ICD-10-CM | POA: Diagnosis not present

## 2022-04-23 DIAGNOSIS — R278 Other lack of coordination: Secondary | ICD-10-CM | POA: Diagnosis not present

## 2022-04-23 DIAGNOSIS — R2681 Unsteadiness on feet: Secondary | ICD-10-CM | POA: Diagnosis not present

## 2022-04-23 DIAGNOSIS — R262 Difficulty in walking, not elsewhere classified: Secondary | ICD-10-CM | POA: Diagnosis not present
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
## 2022-04-25 ENCOUNTER — Ambulatory Visit: Payer: Medicare Other | Admitting: Occupational Therapy

## 2022-04-25 ENCOUNTER — Ambulatory Visit: Payer: Medicare Other | Admitting: Physical Therapy

## 2022-04-25 ENCOUNTER — Encounter: Payer: Self-pay | Admitting: Physical Therapy

## 2022-04-25 DIAGNOSIS — M6281 Muscle weakness (generalized): Secondary | ICD-10-CM

## 2022-04-25 DIAGNOSIS — R2681 Unsteadiness on feet: Secondary | ICD-10-CM | POA: Diagnosis not present

## 2022-04-25 DIAGNOSIS — R293 Abnormal posture: Secondary | ICD-10-CM

## 2022-04-25 DIAGNOSIS — R262 Difficulty in walking, not elsewhere classified: Secondary | ICD-10-CM

## 2022-04-25 DIAGNOSIS — R278 Other lack of coordination: Secondary | ICD-10-CM | POA: Diagnosis not present
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
## 2022-04-30 ENCOUNTER — Ambulatory Visit: Payer: Medicare Other | Admitting: Occupational Therapy

## 2022-04-30 ENCOUNTER — Encounter: Payer: Self-pay | Admitting: Physical Therapy

## 2022-04-30 ENCOUNTER — Ambulatory Visit: Payer: Medicare Other | Admitting: Physical Therapy

## 2022-04-30 DIAGNOSIS — M6281 Muscle weakness (generalized): Secondary | ICD-10-CM | POA: Diagnosis not present

## 2022-04-30 DIAGNOSIS — R2681 Unsteadiness on feet: Secondary | ICD-10-CM

## 2022-04-30 DIAGNOSIS — R293 Abnormal posture: Secondary | ICD-10-CM

## 2022-04-30 DIAGNOSIS — R262 Difficulty in walking, not elsewhere classified: Secondary | ICD-10-CM | POA: Diagnosis not present

## 2022-04-30 DIAGNOSIS — R278 Other lack of coordination: Secondary | ICD-10-CM | POA: Diagnosis not present
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
## 2022-05-02 ENCOUNTER — Encounter: Payer: Self-pay | Admitting: Diagnostic Neuroimaging

## 2022-05-02 ENCOUNTER — Ambulatory Visit: Payer: Medicare Other | Admitting: Diagnostic Neuroimaging

## 2022-05-02 VITALS — BP 119/73 | HR 70 | Ht 71.0 in | Wt 215.0 lb

## 2022-05-02 DIAGNOSIS — R269 Unspecified abnormalities of gait and mobility: Secondary | ICD-10-CM | POA: Diagnosis not present

## 2022-05-02 DIAGNOSIS — M4804 Spinal stenosis, thoracic region: Secondary | ICD-10-CM

## 2022-05-02 DIAGNOSIS — M5124 Other intervertebral disc displacement, thoracic region: Secondary | ICD-10-CM
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
## 2022-05-03 ENCOUNTER — Ambulatory Visit: Payer: Medicare Other | Admitting: Physical Therapy

## 2022-05-03 ENCOUNTER — Telehealth: Payer: Self-pay

## 2022-05-03 ENCOUNTER — Encounter: Payer: Self-pay | Admitting: Physical Therapy

## 2022-05-03 ENCOUNTER — Telehealth: Payer: Self-pay | Admitting: Diagnostic Neuroimaging

## 2022-05-03 DIAGNOSIS — R293 Abnormal posture: Secondary | ICD-10-CM

## 2022-05-03 DIAGNOSIS — M6281 Muscle weakness (generalized): Secondary | ICD-10-CM

## 2022-05-03 DIAGNOSIS — R262 Difficulty in walking, not elsewhere classified: Secondary | ICD-10-CM

## 2022-05-03 DIAGNOSIS — R2681 Unsteadiness on feet: Secondary | ICD-10-CM

## 2022-05-03 DIAGNOSIS — R278 Other lack of coordination: Secondary | ICD-10-CM

## 2022-05-03 MED ORDER — ROSUVASTATIN CALCIUM 40 MG PO TABS: ORAL_TABLET | ORAL | 1 refills | Status: DC
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
## 2022-05-03 NOTE — Telephone Encounter (Signed)
[employer] medicare Josem Kaufmann: [phone] exp. 05/03/22-06/01/22 sent to GI [phone]

## 2022-05-03 NOTE — Telephone Encounter (Signed)
Also Rodney Sawyer (DOB 1946/06/17) needs the exact same prescription called in for 90 days.Marland Kitchen He takes 20 MG Monday, Wednesday, Friday which he knows.  But he would also like it written for 40 mg for 90 days, in a similar manner.  We cut them in half with a pill cutter.  Also called into [facility] - same number.      Received message above from Pt's wife. Okay to send as requested per PCP.

## 2022-05-07 ENCOUNTER — Ambulatory Visit: Payer: Medicare Other | Admitting: Physical Therapy

## 2022-05-07 ENCOUNTER — Encounter: Payer: Self-pay | Admitting: Physical Therapy

## 2022-05-07 DIAGNOSIS — R262 Difficulty in walking, not elsewhere classified: Secondary | ICD-10-CM

## 2022-05-07 DIAGNOSIS — R2681 Unsteadiness on feet: Secondary | ICD-10-CM

## 2022-05-07 DIAGNOSIS — R293 Abnormal posture: Secondary | ICD-10-CM

## 2022-05-07 DIAGNOSIS — R278 Other lack of coordination: Secondary | ICD-10-CM | POA: Diagnosis not present

## 2022-05-07 DIAGNOSIS — M6281 Muscle weakness (generalized): Secondary | ICD-10-CM
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
## 2022-05-09 ENCOUNTER — Ambulatory Visit: Payer: Medicare Other | Admitting: Physical Therapy

## 2022-05-10 ENCOUNTER — Ambulatory Visit: Payer: Medicare Other | Admitting: Physical Therapy

## 2022-05-10 ENCOUNTER — Encounter: Payer: Self-pay | Admitting: Physical Therapy

## 2022-05-10 DIAGNOSIS — R293 Abnormal posture: Secondary | ICD-10-CM

## 2022-05-10 DIAGNOSIS — R278 Other lack of coordination: Secondary | ICD-10-CM | POA: Diagnosis not present

## 2022-05-10 DIAGNOSIS — R262 Difficulty in walking, not elsewhere classified: Secondary | ICD-10-CM

## 2022-05-10 DIAGNOSIS — M6281 Muscle weakness (generalized): Secondary | ICD-10-CM

## 2022-05-10 DIAGNOSIS — R2681 Unsteadiness on feet: Secondary | ICD-10-CM
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
## 2022-05-13 ENCOUNTER — Encounter: Payer: Self-pay | Admitting: Physical Therapy

## 2022-05-13 ENCOUNTER — Ambulatory Visit: Payer: Medicare Other | Admitting: Physical Therapy

## 2022-05-13 DIAGNOSIS — R293 Abnormal posture: Secondary | ICD-10-CM

## 2022-05-13 DIAGNOSIS — R262 Difficulty in walking, not elsewhere classified: Secondary | ICD-10-CM | POA: Diagnosis not present

## 2022-05-13 DIAGNOSIS — M6281 Muscle weakness (generalized): Secondary | ICD-10-CM

## 2022-05-13 DIAGNOSIS — R2681 Unsteadiness on feet: Secondary | ICD-10-CM

## 2022-05-13 DIAGNOSIS — R278 Other lack of coordination: Secondary | ICD-10-CM
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
## 2022-05-14 ENCOUNTER — Encounter: Payer: Self-pay | Admitting: Gastroenterology

## 2022-05-16 ENCOUNTER — Ambulatory Visit: Payer: Medicare Other | Attending: Internal Medicine | Admitting: Physical Therapy

## 2022-05-16 DIAGNOSIS — M6281 Muscle weakness (generalized): Secondary | ICD-10-CM | POA: Insufficient documentation

## 2022-05-16 DIAGNOSIS — R2681 Unsteadiness on feet: Secondary | ICD-10-CM | POA: Insufficient documentation

## 2022-05-16 DIAGNOSIS — R293 Abnormal posture: Secondary | ICD-10-CM | POA: Insufficient documentation

## 2022-05-16 DIAGNOSIS — R262 Difficulty in walking, not elsewhere classified: Secondary | ICD-10-CM | POA: Diagnosis not present

## 2022-05-16 DIAGNOSIS — R278 Other lack of coordination: Secondary | ICD-10-CM | POA: Diagnosis not present
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
## 2022-05-20 ENCOUNTER — Ambulatory Visit: Payer: Medicare Other | Admitting: Physical Therapy

## 2022-05-20 ENCOUNTER — Other Ambulatory Visit: Payer: Self-pay | Admitting: Internal Medicine

## 2022-05-21 ENCOUNTER — Encounter: Payer: Medicare Other | Admitting: Physical Therapy

## 2022-05-22 ENCOUNTER — Ambulatory Visit
Admission: RE | Admit: 2022-05-22 | Discharge: 2022-05-22 | Disposition: A | Discharge status: Home / Self Care | Payer: Medicare Other | Source: Ambulatory Visit | Attending: Diagnostic Neuroimaging | Admitting: Diagnostic Neuroimaging

## 2022-05-22 ENCOUNTER — Ambulatory Visit: Payer: Medicare Other | Admitting: Physical Therapy

## 2022-05-22 ENCOUNTER — Encounter: Payer: Self-pay | Admitting: Physical Therapy

## 2022-05-22 DIAGNOSIS — R262 Difficulty in walking, not elsewhere classified: Secondary | ICD-10-CM | POA: Diagnosis not present

## 2022-05-22 DIAGNOSIS — R2681 Unsteadiness on feet: Secondary | ICD-10-CM

## 2022-05-22 DIAGNOSIS — R278 Other lack of coordination: Secondary | ICD-10-CM | POA: Diagnosis not present

## 2022-05-22 DIAGNOSIS — R269 Unspecified abnormalities of gait and mobility: Secondary | ICD-10-CM | POA: Diagnosis not present

## 2022-05-22 DIAGNOSIS — R293 Abnormal posture: Secondary | ICD-10-CM

## 2022-05-22 DIAGNOSIS — M6281 Muscle weakness (generalized): Secondary | ICD-10-CM
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
## 2022-05-26 NOTE — Addendum Note (Signed)
Addended by: Andrey Spearman R on: 05/26/2022 10:06 AM   Modules accepted: Orders

## 2022-05-27 ENCOUNTER — Telehealth: Payer: Self-pay | Admitting: Diagnostic Neuroimaging

## 2022-05-27 NOTE — Telephone Encounter (Signed)
Referral sent to [facility], phone # [phone]. 

## 2022-06-03 NOTE — Telephone Encounter (Signed)
Rodney Sawyer from [facility] reports that pt does not want to schedule, he wants to see how it goes.  If Rodney Sawyer has questions Darnelle Bos can be called back at [phone]

## 2022-06-11 ENCOUNTER — Encounter: Payer: Medicare Other | Admitting: Internal Medicine

## 2022-06-11 ENCOUNTER — Ambulatory Visit: Payer: Medicare Other

## 2022-07-10 ENCOUNTER — Encounter: Payer: Self-pay | Admitting: Internal Medicine

## 2022-07-10 ENCOUNTER — Ambulatory Visit (INDEPENDENT_AMBULATORY_CARE_PROVIDER_SITE_OTHER): Payer: Medicare Other | Admitting: Internal Medicine

## 2022-07-10 VITALS — BP 124/82 | HR 66 | Temp 97.4°F | Resp 18 | Ht 71.0 in | Wt 212.5 lb

## 2022-07-10 DIAGNOSIS — Z23 Encounter for immunization: Secondary | ICD-10-CM

## 2022-07-10 DIAGNOSIS — E785 Hyperlipidemia, unspecified: Secondary | ICD-10-CM | POA: Diagnosis not present

## 2022-07-10 DIAGNOSIS — Z0000 Encounter for general adult medical examination without abnormal findings: Secondary | ICD-10-CM | POA: Diagnosis not present

## 2022-07-10 LAB — BASIC METABOLIC PANEL
BUN: 14 mg/dL (ref 6–23)
CO2: 30 mEq/L (ref 19–32)
Calcium: 9 mg/dL (ref 8.4–10.5)
Chloride: 100 mEq/L (ref 96–112)
Creatinine, Ser: 0.95 mg/dL (ref 0.40–1.50)
GFR: 77.18 mL/min (ref >60.00)
Glucose, Bld: 88 mg/dL (ref 70–99)
Potassium: 4.2 mEq/L (ref 3.5–5.1)
Sodium: 136 mEq/L (ref 135–145)

## 2022-07-10 LAB — LIPID PANEL
Cholesterol: 136 mg/dL (ref 0–200)
HDL: 42.7 mg/dL (ref >39.00)
LDL Cholesterol: 73 mg/dL (ref 0–99)
NonHDL: 93.03
Total CHOL/HDL Ratio: 3
Triglycerides: 102 mg/dL (ref 0.0–149.0)
VLDL: 20.4 mg/dL (ref 0.0–40.0)

## 2022-07-10 LAB — AST: AST: 15 U/L (ref 0–37)

## 2022-07-10 LAB — ALT: ALT: 12 U/L (ref 0–53)

## 2022-07-10 MED ORDER — ROSUVASTATIN CALCIUM 40 MG PO TABS: 20.0000 mg | ORAL_TABLET | ORAL | Status: AC

## 2022-07-10 NOTE — Patient Instructions (Addendum)
    GO TO THE LAB : Get the blood work     [place], [place] back for checkup in 6 months    Do you have a "Living will" or "[place] of attorney"? (Advance care planning documents)  If you already have a living will or healthcare power of attorney, is recommended you bring the copy to be scanned in your chart. The document will be available to all the doctors you see in the system.  If you don't have one, please consider create one.     More information at:  [email or web address]
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
## 2022-07-11 ENCOUNTER — Encounter: Payer: Self-pay | Admitting: Internal Medicine

## 2022-07-11 NOTE — Assessment & Plan Note (Signed)
Here for CPX Prediabetes: Last A1c stable HTN: BP is very good, continue benazepril, diltiazem, checking labs Hyperlipidemia: On Crestor 40 mg: Half tablet 3 times a week. Checking labs Depression: On citalopram, controlled. Paroxysmal A-fib: No symptoms for over a year.  Anticoagulated and tolerating well.  He is at risk for falls, had recent PT. Gait disorder: Saw neurology 05/02/2022, had a cervical and thoracic spine MRI done, they noted at T7 and 8 disc herniation, referred to neurosurgery (patient declined it to see neurosurgery: Encouraged to discuss with neurology).  See full report. RTC 6 months

## 2022-07-11 NOTE — Assessment & Plan Note (Signed)
-   Td 2020  - PNM 2013;  Prevnar 08-2014; PNM 20 today - had a RSV   -  had a zostavax and shingrix x 2 -  covid vax  UTD - s/p flu shot -Prostate cancer screening: DRE last year stable, PSA normal.  Only LUTS is a slightly low urine.  No further screening.  If symptoms increase will need workup.  -CCS: colonoscopy 05-2014, 3 polyps, cscope again 05-2017, next 5 years. Got a  letter from GI, plan to  discuss with them further CCS.   - Diet: Discussed - labs: BMP AST ALT FLP -Healthcare POA discussed

## 2022-07-16 ENCOUNTER — Encounter: Payer: Self-pay | Admitting: Gastroenterology

## 2022-07-20 ENCOUNTER — Other Ambulatory Visit: Payer: Self-pay | Admitting: Internal Medicine
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
## 2022-08-19 ENCOUNTER — Ambulatory Visit: Payer: Medicare Other | Admitting: Gastroenterology

## 2022-08-19 ENCOUNTER — Telehealth: Payer: Self-pay

## 2022-08-19 ENCOUNTER — Encounter: Payer: Self-pay | Admitting: Gastroenterology

## 2022-08-19 VITALS — BP 132/64 | HR 64 | Ht 71.0 in | Wt 221.0 lb

## 2022-08-19 DIAGNOSIS — Z7902 Long term (current) use of antithrombotics/antiplatelets: Secondary | ICD-10-CM

## 2022-08-19 DIAGNOSIS — Z86010 Personal history of colonic polyps: Secondary | ICD-10-CM

## 2022-08-19 MED ORDER — NA SULFATE-K SULFATE-MG SULF 17.5-3.13-1.6 GM/177ML PO SOLN: 1.0000 | Freq: Once | ORAL | 0 refills | Status: AC

## 2022-08-19 NOTE — Patient Instructions (Signed)
_______________________________________________________  If your blood pressure at your visit was 140/90 or greater, please contact your primary care physician to follow up on this.  _______________________________________________________  If you are age 78 or older, your body mass index should be between 23-30. Your Body mass index is 30.82 kg/m. If this is out of the aforementioned range listed, please consider follow up with your Primary Care Provider.  If you are age 36 or younger, your body mass index should be between 19-25. Your Body mass index is 30.82 kg/m. If this is out of the aformentioned range listed, please consider follow up with your Primary Care Provider.   ________________________________________________________  The [facility] providers would like to encourage you to use [facility] to communicate with providers for non-urgent requests or questions.  Due to long hold times on the telephone, sending your provider a message by [facility] may be a faster and more efficient way to get a response.  Please allow 48 business hours for a response.  Please remember that this is for non-urgent requests.  _______________________________________________________  Rodney Sawyer have been scheduled for a colonoscopy. Please follow written instructions given to you at your visit today.  Please pick up your prep supplies at the pharmacy within the next 1-3 days. If you use inhalers (even only as needed), please bring them with you on the day of your procedure.   It was a pleasure to see you today!  Thank you for trusting me with your gastrointestinal care!

## 2022-08-19 NOTE — Telephone Encounter (Signed)
[facility] Pre-operative Risk Assessment     Request for surgical clearance:     Endoscopy Procedure  What type of surgery is being performed?     Colonoscopy  When is this surgery scheduled?     10-02-2022  What type of clearance is required ?   Pharmacy  Are there any medications that need to be held prior to surgery and how long? Yes, Eliquis, 2 days  Practice name and name of physician performing surgery?      [facility]  What is your office phone and fax number?      Phone- [phone]  [phone]  Anesthesia type (None, local, MAC, general) ?       MAC

## 2022-08-19 NOTE — Progress Notes (Signed)
[facility] Consult Note:  History: Rodney Sawyer 08/19/2022  Referring provider: Colon Branch, MD  Reason for consult/chief complaint: Colonoscopy (Discuss possible colonoscopy, no concerns or complaints )   Subjective  HPI:  Rodney Sawyer is here to see me today for consideration of surveillance colonoscopy. 07/10/2022 primary care comprehensive physical note reviewed.  Health sounds generally good overall.  Since I last saw him, A-fib in 2019, on Eliquis.  He also has cervical and thoracic disc disease affecting his mobility.  Abdominal wall hernia repaired with mesh placement (Dr. Rosendo Sawyer) March 2022  Last colonoscopy with me November 2018: 3 subcentimeter tubular adenomas removed, 5-year recall recommended.    3 subcentimeter TA's 3 years prior to that.  He has chronic constipation generally under good control if he takes MiraLAX regularly.  Denies rectal bleeding.  Takes Prilosec as needed for heartburn, denies dysphagia, vomiting or weight loss. ROS:  Review of Systems Occasional palpitations Chronic neck and back pain Denies chest pain or dyspnea  Past Medical History: Past Medical History:  Diagnosis Date   AF (paroxysmal atrial fibrillation) (HCC)    dx ~ 2006   Depression    Gait difficulty    GERD (gastroesophageal reflux disease)    occasionally   Hepatitis A 1960   containmented water at school when he was a child   Hyperlipidemia    Hypertension    Seborrheic keratosis 12/2015   R midline upper back   Shingles 12/2013   Skin cancer    Basal cell   Last Cardiology office note Aug 2023 reviewed  Last Echo 2016 - LVEF 50%, no sig valve issues  Past Surgical History: Past Surgical History:  Procedure Laterality Date   ANTERIOR CERVICAL DECOMP/DISCECTOMY FUSION N/A 03/13/2013   Procedure: ANTERIOR CERVICAL DECOMPRESSION/DISCECTOMY FUSION. Cervical four-five, Cervical five-six;  Surgeon: Rodney Connors, MD;  Location: [facility]S;  Service:  Neurosurgery;  Laterality: N/A;   COLONOSCOPY     COLONOSCOPY W/ POLYPECTOMY  2001   negative 2006;[facility]   CYSTOSCOPY  2004   Negative; done for microscopic hematuria, asymptomatic   INSERTION OF MESH N/A 10/10/2020   Procedure: INSERTION OF MESH;  Surgeon: Rodney Ok, MD;  Location: [place];  Service: General;  Laterality: N/A;   POLYPECTOMY     TONSILLECTOMY AND ADENOIDECTOMY     VASECTOMY     XI ROBOTIC ASSISTED VENTRAL HERNIA N/A 10/10/2020   Procedure: XI ROBOTIC ASSISTED VENTRAL HERNIA REPAIR WITH MESH;  Surgeon: Rodney Ok, MD;  Location: [place];  Service: General;  Laterality: N/A;     Family History: Family History  Problem Relation Age of Onset   Alzheimer's disease Father    Alzheimer's disease Mother    Heart attack Paternal Uncle 62       sudden death   Stroke Paternal Grandfather 11   Cancer Neg Hx    Diabetes Neg Hx    Colon cancer Neg Hx    Colon polyps Neg Hx    Esophageal cancer Neg Hx    Rectal cancer Neg Hx    Stomach cancer Neg Hx     Social History: Social History   Socioeconomic History   Marital status: Married    Spouse name: Rodney Sawyer   Number of children: 1   Years of education: Not on file   Highest education level: Not on file  Occupational History   Occupation: retired-disable, workers comp d/t accident 02-2013    Employer: [employer]  Use   Smoking status: Never   Smokeless tobacco: Never   Tobacco comments:    Smoke rarely in college   Vaping Use   Vaping Use: Never used  Substance and Sexual Activity   Alcohol use: Not Currently    Alcohol/week: 7.0 standard drinks of alcohol    Types: 7 Glasses of wine per week    Comment: socially    Drug use: No   Sexual activity: Not on file  Other Topics Concern   Not on file  Social History Narrative   Daughter: Chief Executive Officer, lives Rodney Sawyer    Lives with wife   Social Determinants of Health   Financial Resource Strain: Not on file  Food Insecurity: Not on  file  Transportation Needs: Not on file  Physical Activity: Not on file  Stress: Not on file  Social Connections: Not on file    Allergies: No Known Allergies  Outpatient Meds: Current Outpatient Medications  Medication Sig Dispense Refill   apixaban (ELIQUIS) 5 MG TABS tablet Take 1 tablet (5 mg total) by mouth 2 (two) times daily. 180 tablet 3   benazepril (LOTENSIN) 20 MG tablet Take 1 tablet (20 mg total) by mouth daily.     citalopram (CELEXA) 20 MG tablet Take 1 tablet (20 mg total) by mouth daily. 90 tablet 1   diltiazem (DILT-XR) 180 MG 24 hr capsule Take 1 capsule (180 mg total) by mouth in the morning and at bedtime. 180 capsule 1   flecainide (TAMBOCOR) 100 MG tablet TAKE 3 TABLETS('300MG'$  TOTAL) BY MOUTH AS NEEDED FOR EPISODE OF AFIB AS DIRECTED 90 tablet 0   Na Sulfate-K Sulfate-Mg Sulf 17.5-3.13-1.6 GM/177ML SOLN Take 1 kit by mouth once for 1 dose. 354 mL 0   Omega-3 Fatty Acids (FISH OIL) 1200 MG CAPS Take 1,200 mg by mouth daily.     omeprazole (PRILOSEC) 20 MG capsule Take 20 mg by mouth daily as needed (for heartburn).     polyethylene glycol powder (GLYCOLAX/MIRALAX) 17 GM/SCOOP powder Take 17 g by mouth daily as needed for mild constipation.     rosuvastatin (CRESTOR) 40 MG tablet Take 0.5 tablets (20 mg total) by mouth as directed. 3 times a week     No current facility-administered medications for this visit.      ___________________________________________________________________ Objective   Exam:  BP 132/64   Pulse 64   Ht '5\' 11"'$  (1.803 m)   Wt 221 lb (100.2 kg)   BMI 30.82 kg/m  Wt Readings from Last 3 Encounters:  08/19/22 221 lb (100.2 kg)  07/10/22 212 lb 8 oz (96.4 kg)  05/02/22 215 lb (97.5 kg)    General: Antalgic gait, well-appearing.  Gets on exam table slowly but without assistance Eyes: sclera anicteric, no redness ENT: oral mucosa moist without lesions, no cervical or supraclavicular lymphadenopathy CV: Regular with occasional  premature beat, no JVD, mild peripheral edema Resp: clear to auscultation bilaterally, normal RR and effort noted GI: soft, no tenderness, with active bowel sounds. No guarding or palpable organomegaly noted.  Assessment: Encounter Diagnoses  Name Primary?   Personal history of colonic polyps Yes   Long term (current) use of antithrombotics/antiplatelets     Due for surveillance colonoscopy, after evaluating him my belief the risk-benefit ratio favors doing it.  He was agreeable after discussion of procedure and risks.  The benefits and risks of the planned procedure were described in detail with the patient or (when appropriate) their health care proxy.  Risks were outlined as  including, but not limited to, bleeding, infection, perforation, adverse medication reaction leading to cardiac or pulmonary decompensation, pancreatitis (if ERCP).  The limitation of incomplete mucosal visualization was also discussed.  No guarantees or warranties were given.  Old Eliquis 36 hours before procedure, will clear with cardiology but I expect they will be agreeable.  Resume that there is a next depending on polypectomy.  Low risk of stroke in that scenario.  Thank you for the courtesy of this consult.  Please call me with any questions or concerns.  Nelida Meuse III  CC: Referring provider noted above

## 2022-08-20 NOTE — Telephone Encounter (Signed)
Patient with diagnosis of A Fib on Eliquis for anticoagulation.    Procedure: colonoscopy Date of procedure: 10/02/22   CHA2DS2-VASc Score = 3  This indicates a 3.2% annual risk of stroke. The patient's score is based upon: CHF History: 0 HTN History: 1 Diabetes History: 0 Stroke History: 0 Vascular Disease History: 0 Age Score: 2 Gender Score: 0   CrCl 92 mL/min Platelet count 272K  Per office protocol, patient can hold Eliquis for 2 days prior to procedure.    **This guidance is not considered finalized until pre-operative APP has relayed final recommendations.**

## 2022-08-20 NOTE — Telephone Encounter (Signed)
   Patient Name: Rodney Sawyer  DOB: November 09, 1945 MRN: [id number]  Primary Cardiologist: Jenkins Rouge, MD  Clinical pharmacists have reviewed the patient's past medical history, labs, and current medications as part of preoperative protocol coverage. The following recommendations have been made:   Patient with diagnosis of A Fib on Eliquis for anticoagulation.     Procedure: colonoscopy Date of procedure: 10/02/22     CHA2DS2-VASc Score = 3  This indicates a 3.2% annual risk of stroke. The patient's score is based upon: CHF History: 0 HTN History: 1 Diabetes History: 0 Stroke History: 0 Vascular Disease History: 0 Age Score: 2 Gender Score: 0     CrCl 92 mL/min Platelet count 272K   Per office protocol, patient can hold Eliquis for 2 days prior to procedure. Please resume Eliquis as soon as possible postprocedure, at the discretion of the surgeon.  I will route this recommendation to the requesting party via Epic fax function and remove from pre-op pool.  Please call with questions.  Lenna Sciara, NP 08/20/2022, 4:13 PM

## 2022-08-22 NOTE — Telephone Encounter (Signed)
No answer on either home or cell numbers. Unable to leave a voicemail.

## 2022-08-23 ENCOUNTER — Encounter: Payer: Self-pay | Admitting: Cardiovascular Disease

## 2022-08-23 ENCOUNTER — Ambulatory Visit: Payer: Medicare Other | Attending: Cardiovascular Disease | Admitting: Cardiovascular Disease

## 2022-08-23 VITALS — BP 106/68 | HR 67 | Ht 71.0 in | Wt 222.0 lb

## 2022-08-23 DIAGNOSIS — Z7901 Long term (current) use of anticoagulants: Secondary | ICD-10-CM

## 2022-08-23 DIAGNOSIS — I480 Paroxysmal atrial fibrillation: Secondary | ICD-10-CM

## 2022-08-23 DIAGNOSIS — I10 Essential (primary) hypertension: Secondary | ICD-10-CM | POA: Diagnosis not present

## 2022-08-23 DIAGNOSIS — R001 Bradycardia, unspecified: Secondary | ICD-10-CM

## 2022-08-23 NOTE — Patient Instructions (Signed)
Medication Instructions:  Your physician recommends that you continue on your current medications as directed. Please refer to the Current Medication list given to you today.  *If you need a refill on your cardiac medications before your next appointment, please call your pharmacy*  Lab Work: If you have labs (blood work) drawn today and your tests are completely normal, you will receive your results only by: [place] (if you have MyChart) OR A paper copy in the mail If you have any lab test that is abnormal or we need to change your treatment, we will call you to review the results.  Testing/Procedures: Your physician has requested that you have an echocardiogram. Echocardiography is a painless test that uses sound waves to create images of your heart. It provides your doctor with information about the size and shape of your heart and how well your heart's chambers and valves are working. This procedure takes approximately one hour. There are no restrictions for this procedure. Please do NOT wear cologne, perfume, aftershave, or lotions (deodorant is allowed). Please arrive 15 minutes prior to your appointment time.  Follow-Up: At [facility], you and your health needs are our priority.  As part of our continuing mission to provide you with exceptional heart care, we have created designated Provider Care Teams.  These Care Teams include your primary Cardiologist (physician) and Advanced Practice Providers (APPs -  Physician Assistants and Nurse Practitioners) who all work together to provide you with the care you need, when you need it.  We recommend signing up for the patient portal called "MyChart".  Sign up information is provided on this After Visit Summary.  MyChart is used to connect with patients for Virtual Visits (Telemedicine).  Patients are able to view lab/test results, encounter notes, upcoming appointments, etc.  Non-urgent messages can be sent to your provider as  well.   To learn more about what you can do with MyChart, go to [email or web address].    Your next appointment:   1 year(s)  Provider:   Jenkins Rouge, MD

## 2022-08-23 NOTE — Telephone Encounter (Signed)
Patient is returning your call.  

## 2022-08-23 NOTE — Telephone Encounter (Signed)
Left message to return call.  

## 2022-08-26 NOTE — Telephone Encounter (Signed)
Inbound call from patient, returning Toni's call. Advised patient to hold Eliquis for 2 days prior to procedure. Patient had no other questions at this time.

## 2022-09-03 DIAGNOSIS — C44212 Basal cell carcinoma of skin of right ear and external auricular canal: Secondary | ICD-10-CM | POA: Diagnosis not present

## 2022-09-03 DIAGNOSIS — B360 Pityriasis versicolor: Secondary | ICD-10-CM | POA: Diagnosis not present

## 2022-09-03 DIAGNOSIS — L603 Nail dystrophy: Secondary | ICD-10-CM | POA: Diagnosis not present

## 2022-09-03 DIAGNOSIS — L570 Actinic keratosis: Secondary | ICD-10-CM | POA: Diagnosis not present

## 2022-09-03 DIAGNOSIS — L821 Other seborrheic keratosis: Secondary | ICD-10-CM | POA: Diagnosis not present

## 2022-09-03 DIAGNOSIS — C44519 Basal cell carcinoma of skin of other part of trunk: Secondary | ICD-10-CM | POA: Diagnosis not present

## 2022-09-03 DIAGNOSIS — L918 Other hypertrophic disorders of the skin: Secondary | ICD-10-CM | POA: Diagnosis not present

## 2022-09-03 DIAGNOSIS — D485 Neoplasm of uncertain behavior of skin: Secondary | ICD-10-CM | POA: Diagnosis not present

## 2022-09-17 ENCOUNTER — Ambulatory Visit (HOSPITAL_COMMUNITY): Payer: Medicare Other | Attending: Cardiovascular Disease

## 2022-09-17 DIAGNOSIS — I480 Paroxysmal atrial fibrillation: Secondary | ICD-10-CM | POA: Diagnosis not present

## 2022-09-17 LAB — ECHOCARDIOGRAM COMPLETE
AR max vel: 3.05 cm2
AV Area VTI: 2.89 cm2
AV Area mean vel: 2.85 cm2
AV Mean grad: 5 mmHg
AV Peak grad: 9.1 mmHg
Ao pk vel: 1.51 m/s
Area-P 1/2: 3.03 cm2
S' Lateral: 3.5 cm

## 2022-09-24 ENCOUNTER — Encounter: Payer: Self-pay | Admitting: Gastroenterology

## 2022-09-26 ENCOUNTER — Telehealth: Payer: Self-pay | Admitting: Gastroenterology

## 2022-09-26 NOTE — Telephone Encounter (Signed)
Reviewed the 5 days before the procedure restrictions with the patient.

## 2022-09-26 NOTE — Telephone Encounter (Signed)
Inbound call from patient requesting to speak with a nurse regarding his prep instructions for upcomming procedure.Please advise

## 2022-10-02 ENCOUNTER — Ambulatory Visit (AMBULATORY_SURGERY_CENTER): Payer: Medicare Other | Admitting: Gastroenterology

## 2022-10-02 ENCOUNTER — Encounter: Payer: Self-pay | Admitting: Gastroenterology

## 2022-10-02 VITALS — BP 94/54 | HR 66 | Temp 97.1°F | Resp 13 | Ht 71.0 in | Wt 221.0 lb

## 2022-10-02 DIAGNOSIS — D123 Benign neoplasm of transverse colon: Secondary | ICD-10-CM | POA: Diagnosis not present

## 2022-10-02 DIAGNOSIS — Z09 Encounter for follow-up examination after completed treatment for conditions other than malignant neoplasm: Secondary | ICD-10-CM | POA: Diagnosis not present

## 2022-10-02 DIAGNOSIS — Z86010 Personal history of colonic polyps: Secondary | ICD-10-CM

## 2022-10-02 MED ORDER — SODIUM CHLORIDE 0.9 % IV SOLN: 500.0000 mL | Freq: Once | INTRAVENOUS | Status: DC

## 2022-10-02 NOTE — Progress Notes (Unsigned)
Anesthesia Post Note  Patient: Rodney Sawyer  Procedure(s) Performed: * No surgery found *  Anesthesia type: Epidural  Patient location: Mother/Baby  Post pain: Pain level controlled  Post assessment: Post-op Vital signs reviewed  Last Vitals:  Vitals:   10/02/22 1540 10/02/22 1544  BP: (!) 68/42 (!) 94/54  Pulse:    Resp: 15 13  Temp:    SpO2: 98% 96%    Post vital signs: Reviewed  Level of consciousness:alert  Complications: No apparent anesthesia complications

## 2022-10-02 NOTE — Progress Notes (Unsigned)
History and Physical:  This patient presents for endoscopic testing for: Encounter Diagnosis  Name Primary?   Personal history of colonic polyps Yes    Clinical details in 08/19/22 office consult note. Hx colon polyps in 2018 and 2015 Patient has briefly held [facility] for this procedure. Patient denies chronic abdominal pain, rectal bleeding, constipation or diarrhea.  Patient is otherwise without complaints or active issues today.   Past Medical History: Past Medical History:  Diagnosis Date   AF (paroxysmal atrial fibrillation) ([place])    dx ~ 2006   Depression    Gait difficulty    GERD (gastroesophageal reflux disease)    occasionally   Hepatitis A 1960   containmented water at school when he was a child   Hyperlipidemia    Hypertension    Seborrheic keratosis 12/2015   R midline upper back   Shingles 12/2013   Skin cancer    Basal cell     Past Surgical History: Past Surgical History:  Procedure Laterality Date   ANTERIOR CERVICAL DECOMP/DISCECTOMY FUSION N/A 03/13/2013   Procedure: ANTERIOR CERVICAL DECOMPRESSION/DISCECTOMY FUSION. Cervical four-five, Cervical five-six;  Surgeon: Otilio Connors, MD;  Location: [facility] NEURO ORS;  Service: Neurosurgery;  Laterality: N/A;   COLONOSCOPY     COLONOSCOPY W/ POLYPECTOMY  2001   negative 2006;[facility]   CYSTOSCOPY  2004   Negative; done for microscopic hematuria, asymptomatic   INSERTION OF MESH N/A 10/10/2020   Procedure: INSERTION OF MESH;  Surgeon: Ralene Ok, MD;  Location: [place];  Service: General;  Laterality: N/A;   POLYPECTOMY     TONSILLECTOMY AND ADENOIDECTOMY     VASECTOMY     XI ROBOTIC ASSISTED VENTRAL HERNIA N/A 10/10/2020   Procedure: XI ROBOTIC ASSISTED VENTRAL HERNIA REPAIR WITH MESH;  Surgeon: Ralene Ok, MD;  Location: [place];  Service: General;  Laterality: N/A;    Allergies: No Known Allergies  Outpatient Meds: Current Outpatient Medications  Medication Sig Dispense Refill   benazepril  (LOTENSIN) 20 MG tablet Take 1 tablet (20 mg total) by mouth daily.     citalopram (CELEXA) 20 MG tablet Take 1 tablet (20 mg total) by mouth daily. 90 tablet 1   diltiazem (DILT-XR) 180 MG 24 hr capsule Take 1 capsule (180 mg total) by mouth in the morning and at bedtime. 180 capsule 1   Omega-3 Fatty Acids (FISH OIL) 1200 MG CAPS Take 1,200 mg by mouth daily.     polyethylene glycol powder (GLYCOLAX/MIRALAX) 17 GM/SCOOP powder Take 17 g by mouth daily as needed for mild constipation.     rosuvastatin (CRESTOR) 40 MG tablet Take 0.5 tablets (20 mg total) by mouth as directed. 3 times a week     apixaban (ELIQUIS) 5 MG TABS tablet Take 1 tablet (5 mg total) by mouth 2 (two) times daily. 180 tablet 3   flecainide (TAMBOCOR) 100 MG tablet TAKE 3 TABLETS(300MG  TOTAL) BY MOUTH AS NEEDED FOR EPISODE OF AFIB AS DIRECTED 90 tablet 0   omeprazole (PRILOSEC) 20 MG capsule Take 20 mg by mouth daily as needed (for heartburn).     Current Facility-Administered Medications  Medication Dose Route Frequency Provider Last Rate Last Admin   0.9 %  sodium chloride infusion  500 mL Intravenous Once Nelida Meuse III, MD          ___________________________________________________________________ Objective   Exam:  BP 116/70   Pulse 84   Temp (!) 97.1 F (36.2 C)   Ht 5\' 11"  (1.803 m)  Wt 221 lb (100.2 kg)   SpO2 97%   BMI 30.82 kg/m   CV: regular , S1/S2 Resp: clear to auscultation bilaterally, normal RR and effort noted GI: soft, no tenderness, with active bowel sounds.   Assessment: Encounter Diagnosis  Name Primary?   Personal history of colonic polyps Yes     Plan: Colonoscopy  The benefits and risks of the planned procedure were described in detail with the patient or (when appropriate) their health care proxy.  Risks were outlined as including, but not limited to, bleeding, infection, perforation, adverse medication reaction leading to cardiac or pulmonary decompensation,  pancreatitis (if ERCP).  The limitation of incomplete mucosal visualization was also discussed.  No guarantees or warranties were given.    The patient is appropriate for an endoscopic procedure in the ambulatory setting.   - Wilfrid Lund, MD

## 2022-10-02 NOTE — Patient Instructions (Addendum)
-   Resume previous diet. - Resume Eliquis (apixaban) at prior dose tomorrow. - Await pathology results. - Repeat colonoscopy in 6 months for surveillance.  GOLYTELY prep for next exam.  YOU HAD AN ENDOSCOPIC PROCEDURE TODAY AT [place]:   Refer to the procedure report that was given to you for any specific questions about what was found during the examination.  If the procedure report does not answer your questions, please call your gastroenterologist to clarify.  If you requested that your care partner not be given the details of your procedure findings, then the procedure report has been included in a sealed envelope for you to review at your convenience later.  YOU SHOULD EXPECT: Some feelings of bloating in the abdomen. Passage of more gas than usual.  Walking can help get rid of the air that was put into your GI tract during the procedure and reduce the bloating. If you had a lower endoscopy (such as a colonoscopy or flexible sigmoidoscopy) you may notice spotting of blood in your stool or on the toilet paper. If you underwent a bowel prep for your procedure, you may not have a normal bowel movement for a few days.  Please Note:  You might notice some irritation and congestion in your nose or some drainage.  This is from the oxygen used during your procedure.  There is no need for concern and it should clear up in a day or so.  SYMPTOMS TO REPORT IMMEDIATELY:  Following lower endoscopy (colonoscopy or flexible sigmoidoscopy):  Excessive amounts of blood in the stool  Significant tenderness or worsening of abdominal pains  Swelling of the abdomen that is new, acute  Fever of 100F or higher  For urgent or emergent issues, a gastroenterologist can be reached at any hour by calling [phone]. Do not use MyChart messaging for urgent concerns.    DIET:  We do recommend a small meal at first, but then you may proceed to your regular diet.  Drink plenty of fluids but you  should avoid alcoholic beverages for 24 hours.  ACTIVITY:  You should plan to take it easy for the rest of today and you should NOT DRIVE or use heavy machinery until tomorrow (because of the sedation medicines used during the test).    FOLLOW UP: Our staff will call the number listed on your records the next business day following your procedure.  We will call around 7:15- 8:00 am to check on you and address any questions or concerns that you may have regarding the information given to you following your procedure. If we do not reach you, we will leave a message.     If any biopsies were taken you will be contacted by phone or by letter within the next 1-3 weeks.  Please call us at [phone] if you have not heard about the biopsies in 3 weeks.    SIGNATURES/CONFIDENTIALITY: You and/or your care partner have signed paperwork which will be entered into your electronic medical record.  These signatures attest to the fact that that the information above on your After Visit Summary has been reviewed and is understood.  Full responsibility of the confidentiality of this discharge information lies with you and/or your care-partner.

## 2022-10-02 NOTE — Op Note (Signed)
[facility] Patient Name: Rodney Sawyer Procedure Date: 10/02/2022 3:16 PM MRN: [id number] Endoscopist: Mallie Mussel L. Loletha Carrow , MD, [id number] Age: 77 Referring MD:  Date of Birth: 1946-01-03 Gender: Male Account #: [id number] Procedure:                Colonoscopy Indications:              Surveillance: Personal history of adenomatous                            polyps on last colonoscopy > 5 years ago                           3 subcentimeter tubular adenomas November 2018                           3 tubular adenomas in 2015 Medicines:                Monitored Anesthesia Care Procedure:                Pre-Anesthesia Assessment:                           - Prior to the procedure, a History and Physical                            was performed, and patient medications and                            allergies were reviewed. The patient's tolerance of                            previous anesthesia was also reviewed. The risks                            and benefits of the procedure and the sedation                            options and risks were discussed with the patient.                            All questions were answered, and informed consent                            was obtained. Prior Anticoagulants: The patient has                            taken Eliquis (apixaban), last dose was 2 days                            prior to procedure. ASA Grade Assessment: II - A                            patient with mild systemic disease. After reviewing  the risks and benefits, the patient was deemed in                            satisfactory condition to undergo the procedure.                           After obtaining informed consent, the colonoscope                            was passed under direct vision. Throughout the                            procedure, the patient's blood pressure, pulse, and                            oxygen saturations were monitored  continuously. The                            Olympus [id number]) Colonoscope was                            introduced through the anus and advanced to the the                            cecum, identified by the ileocecal valve.(AO                            obscured by stool). The colonoscopy was performed                            with difficulty due to poor bowel prep, a redundant                            colon and significant looping. Successful                            completion of the procedure was aided by changing                            the patient's position, using manual pressure,                            straightening and shortening the scope to obtain                            bowel loop reduction and lavage. The patient                            tolerated the procedure well. The quality of the                            bowel preparation was poor. There was thick opaque  semiliquid stool throughout the colon. The                            ileocecal valve and the rectum were photographed.                            The bowel preparation used was SUPREP via split                            dose instruction. (Same prep used on last exam) Scope In: 3:21:27 PM Scope Out: 3:42:43 PM Scope Withdrawal Time: 0 hours 10 minutes 43 seconds  Total Procedure Duration: 0 hours 21 minutes 16 seconds  Findings:                 The perianal and digital rectal examinations were                            normal.                           Copious quantities of stool was found in the entire                            colon, precluding visualization. Lavage performed                            to little avail.                           A 6 mm polyp was found in the transverse colon. The                            polyp was sessile. The polyp was removed with a                            cold snare. Resection and retrieval were complete.                            The colon (entire examined portion) was                            significantly redundant. Scope passage was quite                            challenging as a result.                           The exam was otherwise without abnormality (with                            limited view due to poor prep as noted above). Complications:            No immediate complications. Estimated Blood Loss:     Estimated blood loss was minimal. Impression:               -  Preparation of the colon was poor.                           - Stool in the entire examined colon.                           - One 6 mm polyp in the transverse colon, removed                            with a cold snare. Resected and retrieved.                           - Redundant colon.                           - The examination was otherwise normal. Recommendation:           - Patient has a contact number available for                            emergencies. The signs and symptoms of potential                            delayed complications were discussed with the                            patient. Return to normal activities tomorrow.                            Written discharge instructions were provided to the                            patient.                           - Resume previous diet.                           - Resume Eliquis (apixaban) at prior dose tomorrow.                           - Await pathology results.                           - Repeat colonoscopy in 6 months for surveillance.                            GOLYTELY prep for next exam. [name], MD 10/02/2022 3:49:34 PM This report has been signed electronically.

## 2022-10-02 NOTE — Progress Notes (Unsigned)
Called to room to assist during endoscopic procedure.  Patient ID and intended procedure confirmed with present staff. Received instructions for my participation in the procedure from the performing physician.  

## 2022-10-03 ENCOUNTER — Telehealth: Payer: Self-pay | Admitting: *Deleted

## 2022-10-03 NOTE — Telephone Encounter (Signed)
  Follow up Call-     10/02/2022    2:53 PM  Call back number  Post procedure Call Back phone  # [phone]  Permission to leave phone message Yes     Patient questions:  Do you have a fever, pain , or abdominal swelling? No. Pain Score  0 *  Have you tolerated food without any problems? Yes.    Have you been able to return to your normal activities? Yes.    Do you have any questions about your discharge instructions: Diet   No. Medications  No. Follow up visit  No.  Do you have questions or concerns about your Care? No.  Actions: * If pain score is 4 or above: No action needed, pain <4.

## 2022-10-05 ENCOUNTER — Encounter: Payer: Self-pay | Admitting: Internal Medicine

## 2022-10-07 MED ORDER — OMEPRAZOLE 20 MG PO CPDR: 20.0000 mg | DELAYED_RELEASE_CAPSULE | Freq: Every day | ORAL | 1 refills | Status: DC | PRN

## 2022-10-08 ENCOUNTER — Encounter: Payer: Self-pay | Admitting: Gastroenterology

## 2022-10-08 DIAGNOSIS — C44212 Basal cell carcinoma of skin of right ear and external auricular canal: Secondary | ICD-10-CM | POA: Diagnosis not present

## 2022-10-17 ENCOUNTER — Telehealth: Payer: Self-pay | Admitting: Internal Medicine

## 2022-10-17 DIAGNOSIS — L905 Scar conditions and fibrosis of skin: Secondary | ICD-10-CM | POA: Diagnosis not present

## 2022-10-17 NOTE — Telephone Encounter (Signed)
Contacted NACHO DICE to schedule their annual wellness visit. Appointment made for 10/24/2022.  Sherol Dade; Care Guide [facility] Direct Dial: [phone]

## 2022-10-24 ENCOUNTER — Ambulatory Visit (INDEPENDENT_AMBULATORY_CARE_PROVIDER_SITE_OTHER): Payer: Medicare Other | Admitting: *Deleted

## 2022-10-24 VITALS — BP 120/66 | HR 68 | Ht 71.0 in | Wt 222.0 lb

## 2022-10-24 DIAGNOSIS — Z0000 Encounter for general adult medical examination without abnormal findings: Secondary | ICD-10-CM

## 2022-10-24 NOTE — Patient Instructions (Signed)
Rodney Sawyer , Thank you for taking time to come for your Medicare Wellness Visit. I appreciate your ongoing commitment to your health goals. Please review the following plan we discussed and let me know if I can assist you in the future.   These are the goals we discussed:  Goals   None     This is a list of the screening recommended for you and due dates:  Health Maintenance  Topic Date Due   Flu Shot  02/13/2023   Colon Cancer Screening  10/02/2023   Medicare Annual Wellness Visit  10/24/2023   DTaP/Tdap/Td vaccine (4 - Td or Tdap) 05/30/2029   Pneumonia Vaccine  Completed   Hepatitis C Screening: USPSTF Recommendation to screen - Ages 22-79 yo.  Completed   Zoster (Shingles) Vaccine  Completed   HPV Vaccine  Aged Out   COVID-19 Vaccine  Discontinued     Next appointment: Follow up in one year for your annual wellness visit.   Preventive Care 78 Years and Older, Male Preventive care refers to lifestyle choices and visits with your health care provider that can promote health and wellness. What does preventive care include? A yearly physical exam. This is also called an annual well check. Dental exams once or twice a year. Routine eye exams. Ask your health care provider how often you should have your eyes checked. Personal lifestyle choices, including: Daily care of your teeth and gums. Regular physical activity. Eating a healthy diet. Avoiding tobacco and drug use. Limiting alcohol use. Practicing safe sex. Taking low doses of aspirin every day. Taking vitamin and mineral supplements as recommended by your health care provider. What happens during an annual well check? The services and screenings done by your health care provider during your annual well check will depend on your age, overall health, lifestyle risk factors, and family history of disease. Counseling  Your health care provider may ask you questions about your: Alcohol use. Tobacco use. Drug  use. Emotional well-being. Home and relationship well-being. Sexual activity. Eating habits. History of falls. Memory and ability to understand (cognition). Work and work Astronomer. Screening  You may have the following tests or measurements: Height, weight, and BMI. Blood pressure. Lipid and cholesterol levels. These may be checked every 5 years, or more frequently if you are over 51 years old. Skin check. Lung cancer screening. You may have this screening every year starting at age 34 if you have a 30-pack-year history of smoking and currently smoke or have quit within the past 15 years. Fecal occult blood test (FOBT) of the stool. You may have this test every year starting at age 21. Flexible sigmoidoscopy or colonoscopy. You may have a sigmoidoscopy every 5 years or a colonoscopy every 10 years starting at age 56. Prostate cancer screening. Recommendations will vary depending on your family history and other risks. Hepatitis C blood test. Hepatitis B blood test. Sexually transmitted disease (STD) testing. Diabetes screening. This is done by checking your blood sugar (glucose) after you have not eaten for a while (fasting). You may have this done every 1-3 years. Abdominal aortic aneurysm (AAA) screening. You may need this if you are a current or former smoker. Osteoporosis. You may be screened starting at age 82 if you are at high risk. Talk with your health care provider about your test results, treatment options, and if necessary, the need for more tests. Vaccines  Your health care provider may recommend certain vaccines, such as: Influenza vaccine. This is recommended  every year. Tetanus, diphtheria, and acellular pertussis (Tdap, Td) vaccine. You may need a Td booster every 10 years. Zoster vaccine. You may need this after age 75. Pneumococcal 13-valent conjugate (PCV13) vaccine. One dose is recommended after age 9. Pneumococcal polysaccharide (PPSV23) vaccine. One dose is  recommended after age 61. Talk to your health care provider about which screenings and vaccines you need and how often you need them. This information is not intended to replace advice given to you by your health care provider. Make sure you discuss any questions you have with your health care provider. Document Released: 07/28/2015 Document Revised: 03/20/2016 Document Reviewed: 05/02/2015 Elsevier Interactive Patient Education  2017 [place] Prevention in the Home Falls can cause injuries. They can happen to people of all ages. There are many things you can do to make your home safe and to help prevent falls. What can I do on the outside of my home? Regularly fix the edges of walkways and driveways and fix any cracks. Remove anything that might make you trip as you walk through a door, such as a raised step or threshold. Trim any bushes or trees on the path to your home. Use bright outdoor lighting. Clear any walking paths of anything that might make someone trip, such as rocks or tools. Regularly check to see if handrails are loose or broken. Make sure that both sides of any steps have handrails. Any raised decks and porches should have guardrails on the edges. Have any leaves, snow, or ice cleared regularly. Use sand or salt on walking paths during winter. Clean up any spills in your garage right away. This includes oil or grease spills. What can I do in the bathroom? Use night lights. Install grab bars by the toilet and in the tub and shower. Do not use towel bars as grab bars. Use non-skid mats or decals in the tub or shower. If you need to sit down in the shower, use a plastic, non-slip stool. Keep the floor dry. Clean up any water that spills on the floor as soon as it happens. Remove soap buildup in the tub or shower regularly. Attach bath mats securely with double-sided non-slip rug tape. Do not have throw rugs and other things on the floor that can make you  trip. What can I do in the bedroom? Use night lights. Make sure that you have a light by your bed that is easy to reach. Do not use any sheets or blankets that are too big for your bed. They should not hang down onto the floor. Have a firm chair that has side arms. You can use this for support while you get dressed. Do not have throw rugs and other things on the floor that can make you trip. What can I do in the kitchen? Clean up any spills right away. Avoid walking on wet floors. Keep items that you use a lot in easy-to-reach places. If you need to reach something above you, use a strong step stool that has a grab bar. Keep electrical cords out of the way. Do not use floor polish or wax that makes floors slippery. If you must use wax, use non-skid floor wax. Do not have throw rugs and other things on the floor that can make you trip. What can I do with my stairs? Do not leave any items on the stairs. Make sure that there are handrails on both sides of the stairs and use them. Fix handrails that are broken  or loose. Make sure that handrails are as long as the stairways. Check any carpeting to make sure that it is firmly attached to the stairs. Fix any carpet that is loose or worn. Avoid having throw rugs at the top or bottom of the stairs. If you do have throw rugs, attach them to the floor with carpet tape. Make sure that you have a light switch at the top of the stairs and the bottom of the stairs. If you do not have them, ask someone to add them for you. What else can I do to help prevent falls? Wear shoes that: Do not have high heels. Have rubber bottoms. Are comfortable and fit you well. Are closed at the toe. Do not wear sandals. If you use a stepladder: Make sure that it is fully opened. Do not climb a closed stepladder. Make sure that both sides of the stepladder are locked into place. Ask someone to hold it for you, if possible. Clearly mark and make sure that you can  see: Any grab bars or handrails. First and last steps. Where the edge of each step is. Use tools that help you move around (mobility aids) if they are needed. These include: Canes. Walkers. Scooters. Crutches. Turn on the lights when you go into a dark area. Replace any light bulbs as soon as they burn out. Set up your furniture so you have a clear path. Avoid moving your furniture around. If any of your floors are uneven, fix them. If there are any pets around you, be aware of where they are. Review your medicines with your doctor. Some medicines can make you feel dizzy. This can increase your chance of falling. Ask your doctor what other things that you can do to help prevent falls. This information is not intended to replace advice given to you by your health care provider. Make sure you discuss any questions you have with your health care provider. Document Released: 04/27/2009 Document Revised: 12/07/2015 Document Reviewed: 08/05/2014 Elsevier Interactive Patient Education  2017 [employer].
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
## 2022-10-31 ENCOUNTER — Telehealth: Payer: Self-pay | Admitting: Cardiovascular Disease

## 2022-10-31 NOTE — Telephone Encounter (Signed)
error 

## 2022-11-13 LAB — BASIC METABOLIC PANEL
BUN: 17 (ref 4–21)
CO2: 27 — AB (ref 13–22)
Chloride: 102 (ref 99–108)
Creatinine: 1 (ref 0.6–1.3)
Glucose: 91
Potassium: 4.1 mEq/L (ref 3.5–5.1)
Sodium: 140 (ref 137–147)

## 2022-11-13 LAB — HEPATIC FUNCTION PANEL
ALT: 12 U/L (ref 10–40)
AST: 17 (ref 14–40)
Alkaline Phosphatase: 65 (ref 25–125)
Bilirubin, Total: 0.7

## 2022-11-13 LAB — CBC AND DIFFERENTIAL
HCT: 45 (ref 41–53)
Hemoglobin: 14.6 (ref 13.5–17.5)
Platelets: 234 10*3/uL (ref 150–400)
WBC: 6.3

## 2022-11-13 LAB — LIPID PANEL
Cholesterol: 141 (ref 0–200)
HDL: 49 (ref 35–70)
LDL Cholesterol: 76
Triglycerides: 78 (ref 40–160)

## 2022-11-13 LAB — HEMOGLOBIN A1C: Hemoglobin A1C: 5.5

## 2022-11-13 LAB — COMPREHENSIVE METABOLIC PANEL
Albumin: 4.4 (ref 3.5–5.0)
Calcium: 9.1 (ref 8.7–10.7)

## 2022-11-13 LAB — LAB REPORT - SCANNED: A1c: 5.5

## 2022-11-13 LAB — VITAMIN B12: Vitamin B-12: 338

## 2022-11-13 LAB — TSH: TSH: 1.13 (ref 0.41–5.90)

## 2022-11-13 LAB — CBC: RBC: 4.65 (ref 3.87–5.11)

## 2022-11-26 ENCOUNTER — Other Ambulatory Visit: Payer: Self-pay | Admitting: Internal Medicine

## 2022-12-15 ENCOUNTER — Other Ambulatory Visit: Payer: Self-pay | Admitting: Internal Medicine

## 2022-12-27 ENCOUNTER — Other Ambulatory Visit: Payer: Self-pay | Admitting: Internal Medicine

## 2023-01-10 ENCOUNTER — Ambulatory Visit (INDEPENDENT_AMBULATORY_CARE_PROVIDER_SITE_OTHER): Payer: Medicare Other | Admitting: Internal Medicine

## 2023-01-10 ENCOUNTER — Encounter: Payer: Self-pay | Admitting: Internal Medicine

## 2023-01-10 VITALS — BP 126/74 | HR 68 | Temp 97.7°F | Resp 16 | Ht 71.0 in | Wt 210.5 lb

## 2023-01-10 DIAGNOSIS — E785 Hyperlipidemia, unspecified: Secondary | ICD-10-CM | POA: Diagnosis not present

## 2023-01-10 DIAGNOSIS — I10 Essential (primary) hypertension: Secondary | ICD-10-CM

## 2023-01-10 DIAGNOSIS — I480 Paroxysmal atrial fibrillation: Secondary | ICD-10-CM | POA: Diagnosis not present

## 2023-01-10 DIAGNOSIS — R739 Hyperglycemia, unspecified: Secondary | ICD-10-CM

## 2023-01-10 DIAGNOSIS — F419 Anxiety disorder, unspecified: Secondary | ICD-10-CM

## 2023-01-10 DIAGNOSIS — F32A Depression, unspecified: Secondary | ICD-10-CM
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
## 2023-01-10 NOTE — Patient Instructions (Addendum)
Vaccines I recommend: Flu shot this fall  Check the  blood pressure regularly BP GOAL is between 110/65 and  135/85. If it is consistently higher or lower, let me know       GO TO THE FRONT DESK, PLEASE SCHEDULE YOUR APPOINTMENTS Come back for  a physical exam by 06-2023

## 2023-01-12 NOTE — Assessment & Plan Note (Signed)
Labs from [facility] (metformin on dementia prevention program).  From 11/13/2022: LFTs normal, potassium 4.1, creatinine 0.9.  CBC essentially normal. Total cholesterol 141, LDL 76, TG 78. B12 normal, RPR negative, TSH normal, A1c 5.0. Prediabetes: Last A1c very good. HTN: Reports ambulatory BPs are normal, continue benazepril, diltiazem.  Last BMP okay. High cholesterol: On Crestor, controlled. Paroxysmal A-fib: Tolerated anticoagulation well, rate controlled, he seems to be on an NSR today on clinical grounds. BCC: Recommend to see dermatology regularly. Depression: PHQ-9: 0.  On citalopram RTC 6 months CPX.

## 2023-01-27 ENCOUNTER — Telehealth: Payer: Self-pay | Admitting: Cardiovascular Disease

## 2023-01-27 NOTE — Telephone Encounter (Signed)
Wife stated patient wants provider switch from Dr. Eden Emms to Dr. Antoine Poche.  Please confirm.

## 2023-01-29 NOTE — Telephone Encounter (Signed)
Wife did not specify reason for provider switch.

## 2023-02-03 ENCOUNTER — Telehealth: Payer: Self-pay | Admitting: Internal Medicine

## 2023-02-03 NOTE — Telephone Encounter (Signed)
Spoke w/ Pt- informed of pcp recommendations. Pt verbalized understanding. He denied any GI problems.

## 2023-02-03 NOTE — Telephone Encounter (Signed)
Pt called stating that he was receiving messages regarding the US Abdomen that he had scheduled back on 8.16.23. Pt would like to know if PCP would still like to have that done. Please Advise.

## 2023-02-03 NOTE — Telephone Encounter (Signed)
Please advise 

## 2023-02-03 NOTE — Addendum Note (Signed)
Addended byConrad Palestine D on: 02/03/2023 04:50 PM   Modules accepted: Orders

## 2023-02-03 NOTE — Telephone Encounter (Signed)
The ultrasound was ordered 02/27/2022, the same day was admitted to the hospital, a CT was done at that time.   Plan: I do not see a need to do ultrasound.  If he has GI symptoms needs to be seen.

## 2023-02-07 IMAGING — CT CT CHEST W/O CM
2 of 3 series · 15 of 36 positions shown, 18 images · non-contrast
Comparison: CT abdomen done on 08/07/2020, chest radiograph done on
03/12/2013

CLINICAL DATA: Lung nodule seen in the previous CT abdomen



[Series 2: thorax · axial · 0.75mm/px · z∈[-330,-52]mm · 12 of 165 slices shown, 15 images]
[im 13/165  mediastinal]
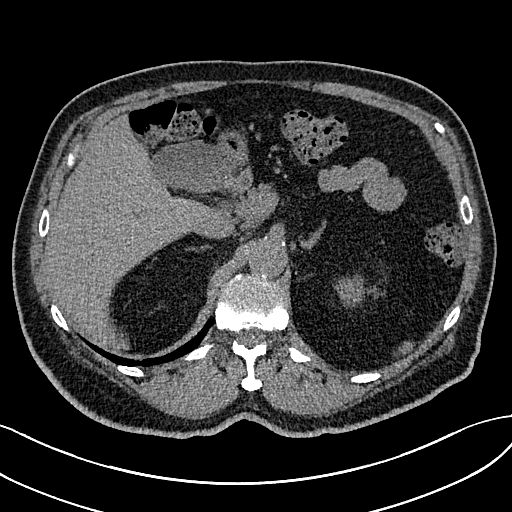
[im 13/165  lung]
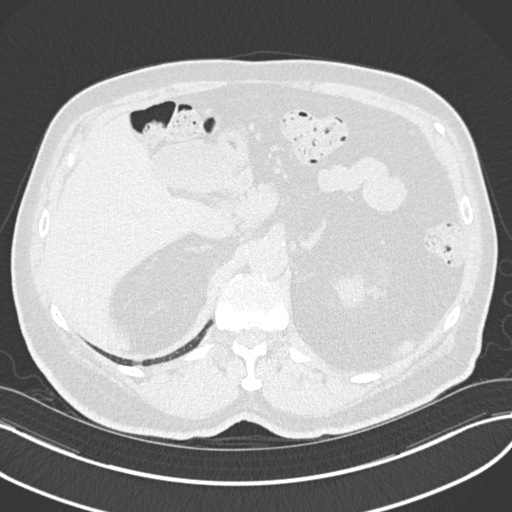
[im 25/165  lung]
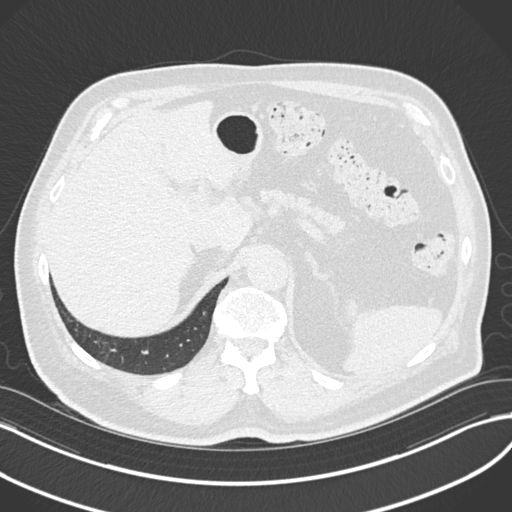
[im 37/165  lung]
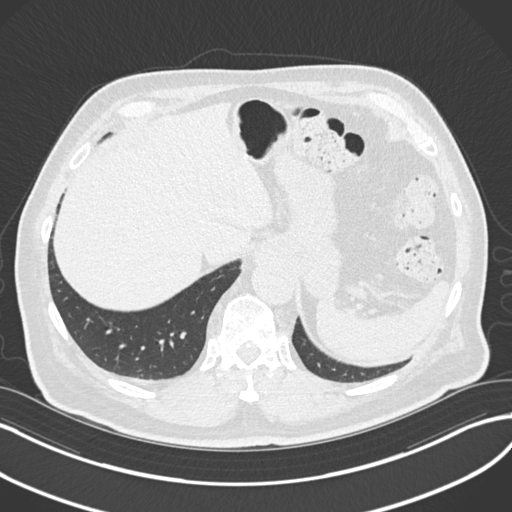
[im 49/165  lung]
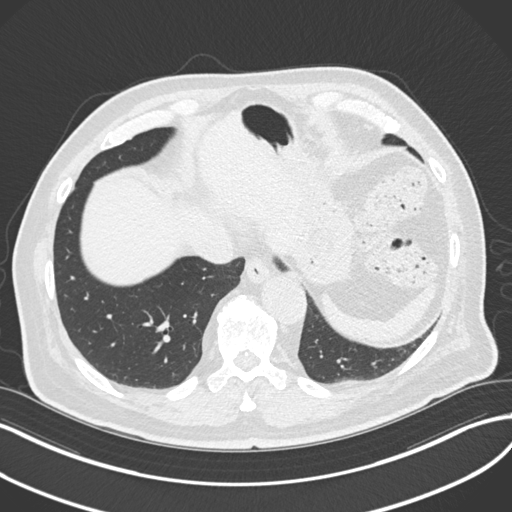
[im 61/165  mediastinal]
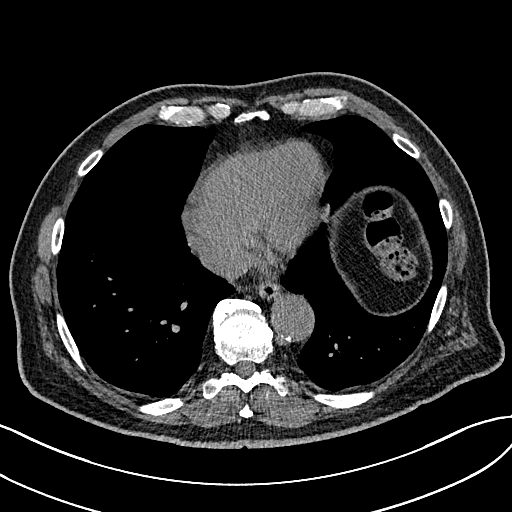
[im 61/165  lung]
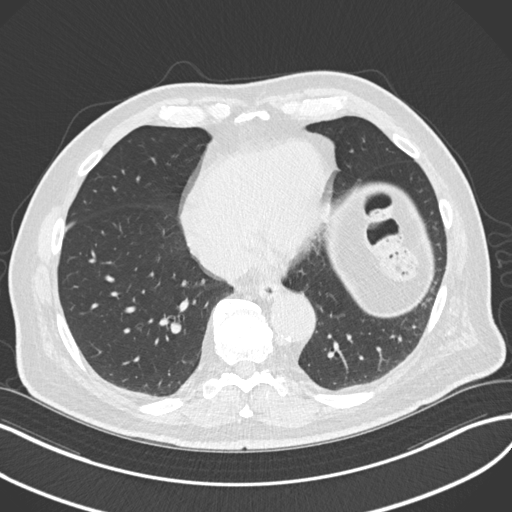
[im 73/165  lung]
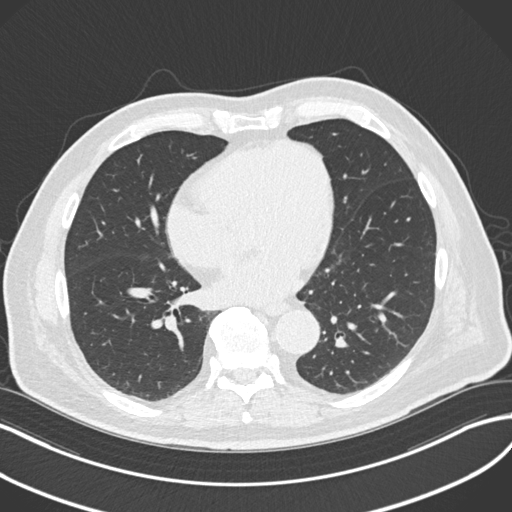
[im 92/165  lung]
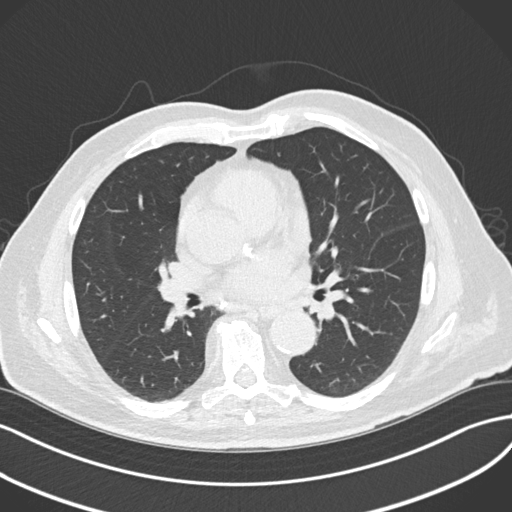
[im 104/165  lung]
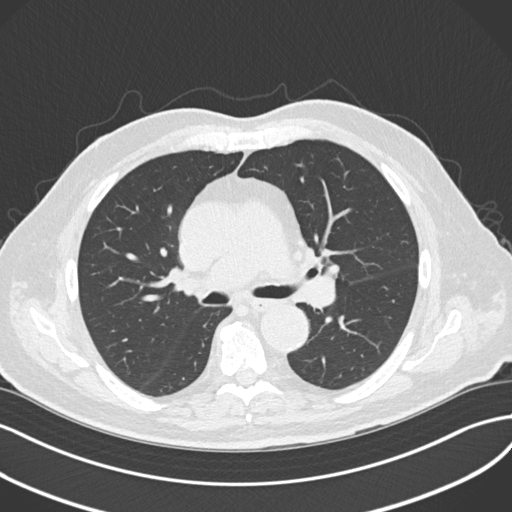
[im 116/165  mediastinal]
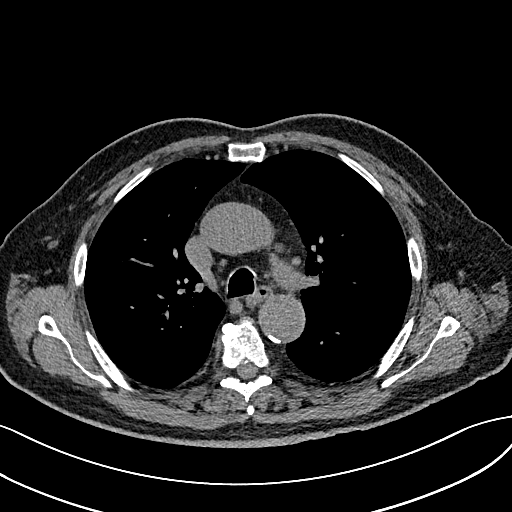
[im 116/165  lung]
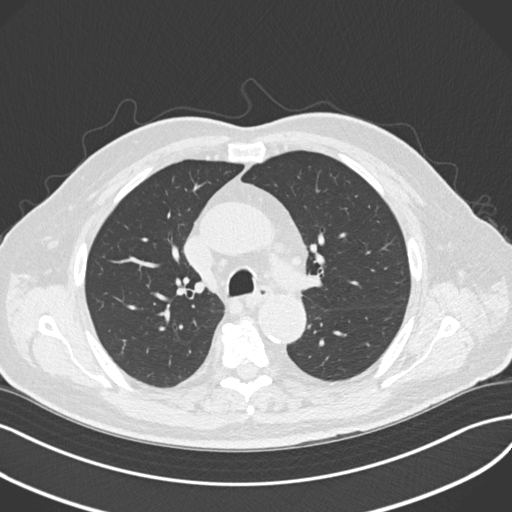
[im 128/165  lung]
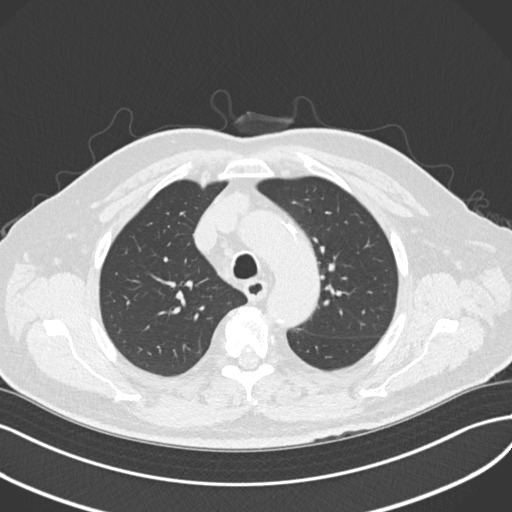
[im 140/165  lung]
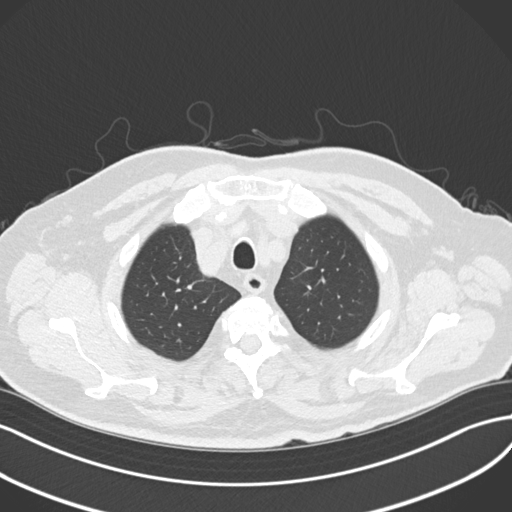
[im 152/165  lung]
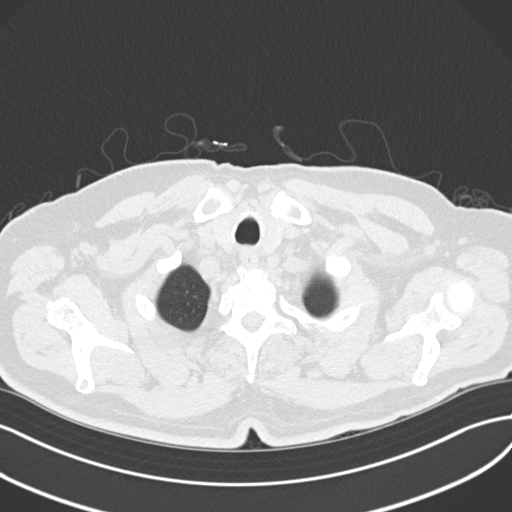

[Series 5: coronal · coronal · 0.68mm/px · 3 of 136 slices shown]
[im 28/136  lung]
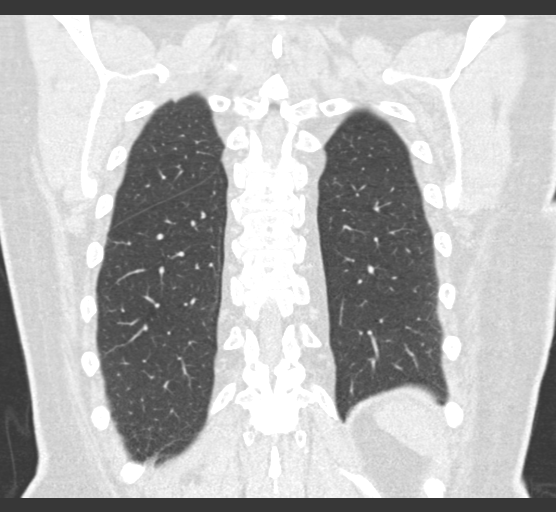
[im 55/136  lung]
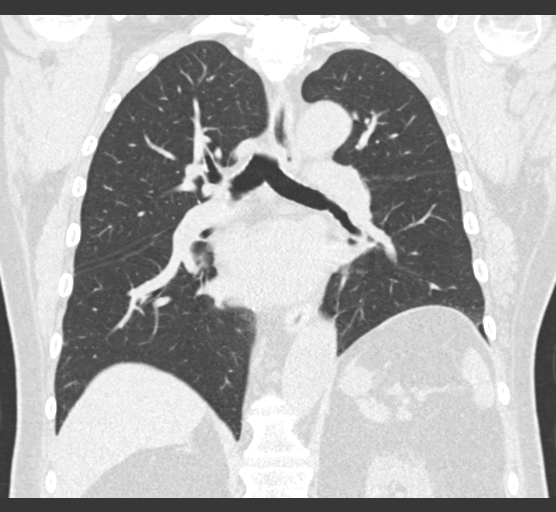
[im 82/136  lung]
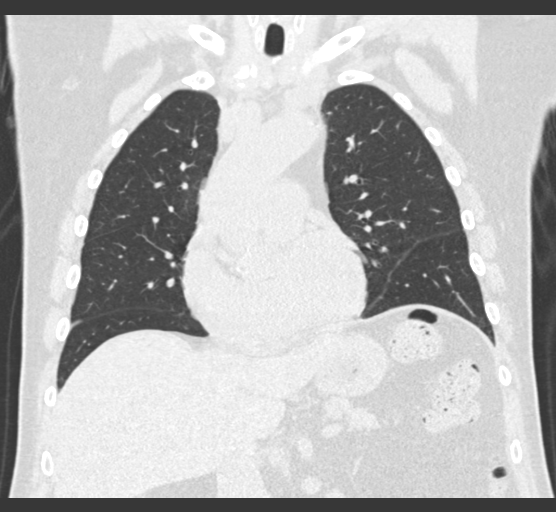

[15 of 36 positions shown; findings below may reference images not displayed]

FINDINGS: Cardiovascular: There are scattered calcifications in the thoracic
aorta.

Mediastinum/Nodes: There are subcentimeter nodes in the mediastinum
suggesting reactive hyperplasia.

Lungs/Pleura: In the image 113 of series 4 there is 3 mm
pleural-based nodule in the right lower lobe which appears stable.
There are no new nodules or new infiltrates in the lung fields.
There is no pleural effusion or pneumothorax.

Upper Abdomen: Unremarkable.

Musculoskeletal: Degenerative changes are noted with significant
encroachment of neural foramina at C7-T1 level. Posterior bony spurs
causing extrinsic pressure over ventral margin of thecal sac at
T7-T8 level.
IMPRESSION: 3 mm pleural-based nodule in the right lower lobe has not changed in
comparison with the CT abdomen done on 08/07/2020. No further
follow-up is needed.

There are no new nodules or new infiltrates. No significant
lymphadenopathy is seen.

Degenerative changes are noted with significant encroachment of
neural foramina at C7-T1 level. Posterior bony spurs at T7-T8 level
are causing extrinsic pressure over the ventral margin of thecal sac
in the thoracic spinal canal.
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
## 2023-02-20 ENCOUNTER — Encounter: Payer: Self-pay | Admitting: Cardiovascular Disease

## 2023-02-20 ENCOUNTER — Ambulatory Visit: Payer: Medicare Other | Attending: Cardiovascular Disease | Admitting: Cardiovascular Disease

## 2023-02-20 VITALS — BP 126/80 | HR 71 | Ht 71.0 in | Wt 211.6 lb

## 2023-02-20 DIAGNOSIS — I480 Paroxysmal atrial fibrillation: Secondary | ICD-10-CM | POA: Diagnosis not present

## 2023-02-20 DIAGNOSIS — I10 Essential (primary) hypertension: Secondary | ICD-10-CM | POA: Diagnosis not present

## 2023-02-20 MED ORDER — FLECAINIDE ACETATE 100 MG PO TABS: ORAL_TABLET | ORAL | 3 refills | Status: DC

## 2023-02-20 NOTE — Patient Instructions (Signed)

## 2023-02-26 ENCOUNTER — Ambulatory Visit: Payer: Medicare Other | Admitting: Cardiovascular Disease

## 2023-03-05 ENCOUNTER — Other Ambulatory Visit: Payer: Self-pay | Admitting: Internal Medicine

## 2023-03-26 ENCOUNTER — Encounter: Payer: Self-pay | Admitting: Gastroenterology

## 2023-03-26 ENCOUNTER — Ambulatory Visit (INDEPENDENT_AMBULATORY_CARE_PROVIDER_SITE_OTHER): Payer: Medicare Other | Admitting: Gastroenterology

## 2023-03-26 ENCOUNTER — Ambulatory Visit: Payer: Medicare Other | Admitting: Gastroenterology

## 2023-03-26 VITALS — BP 140/82 | HR 78 | Ht 71.0 in | Wt 212.0 lb

## 2023-03-26 DIAGNOSIS — Z7901 Long term (current) use of anticoagulants: Secondary | ICD-10-CM | POA: Insufficient documentation

## 2023-03-26 DIAGNOSIS — Z86010 Personal history of colonic polyps: Secondary | ICD-10-CM | POA: Diagnosis not present

## 2023-03-26 MED ORDER — GOLYTELY 236 G PO SOLR: 4000.0000 mL | Freq: Once | ORAL | 0 refills | Status: AC

## 2023-03-26 NOTE — Patient Instructions (Signed)
You will be contacted by our office prior to your procedure for directions on holding your Eliquis.  If you do not hear from our office 1 week prior to your scheduled procedure, please call [phone] to discuss.   You have been scheduled for a colonoscopy. Please follow written instructions given to you at your visit today.   Please pick up your prep supplies at the pharmacy within the next 1-3 days.  If you use inhalers (even only as needed), please bring them with you on the day of your procedure.  DO NOT TAKE 7 DAYS PRIOR TO TEST- Trulicity (dulaglutide) Ozempic, Wegovy (semaglutide) Mounjaro (tirzepatide) Bydureon Bcise (exanatide extended release)  DO NOT TAKE 1 DAY PRIOR TO YOUR TEST Rybelsus (semaglutide) Adlyxin (lixisenatide) Victoza (liraglutide) Byetta (exanatide) ___________________________________________________________________________  _______________________________________________________  If your blood pressure at your visit was 140/90 or greater, please contact your primary care physician to follow up on this.  _______________________________________________________  If you are age 59 or older, your body mass index should be between 23-30. Your Body mass index is 29.57 kg/m. If this is out of the aforementioned range listed, please consider follow up with your Primary Care Provider.  If you are age 21 or younger, your body mass index should be between 19-25. Your Body mass index is 29.57 kg/m. If this is out of the aformentioned range listed, please consider follow up with your Primary Care Provider.   ________________________________________________________  The [facility] providers would like to encourage you to use [facility] to communicate with providers for non-urgent requests or questions.  Due to long hold times on the telephone, sending your provider a message by [facility] may be a faster and more efficient way to get a response.  Please allow 48 business  hours for a response.  Please remember that this is for non-urgent requests.  _______________________________________________________

## 2023-03-26 NOTE — Progress Notes (Signed)
03/26/2023 Rodney Sawyer [id number] Aug 05, 1945   HISTORY OF PRESENT ILLNESS:  This is a 77 year old male is a patient of Dr. Irving Burton.  He is here today to discuss and schedule colonoscopy.  Has personal history of colon polyps on colonoscopy in 2018 with 3 subcentimeter tubular adenomas removed, 5-year recall recommended.  3 subcentimeter tubular adenomas 3 years prior to that.  Incomplete colonoscopy earlier this year due to poor prep.  Needs repeat colonoscopy with GoLytely prep.  He is using MiraLAX daily for his underlying constipation and that seems to be working well.  No other new complaints today.  He is on Eliquis for history of atrial fibrillation that is controlled by just cardiologist, Dr. Eden Emms.  No issues with holding this previously for hernia repair and then earlier this year for colonoscopy.  Of note, he is on a study with neurology through the [facility] and is either on a placebo or a dose of metformin, it is a double-blind study.  Will treat him as if he is on metformin in regards to holding dosing, etc.   Past Medical History:  Diagnosis Date   AF (paroxysmal atrial fibrillation) (HCC)    dx ~ 2006   Depression    Gait difficulty    GERD (gastroesophageal reflux disease)    occasionally   Hepatitis A 1960   containmented water at school when he was a child   Hyperlipidemia    Hypertension    Seborrheic keratosis 12/2015   R midline upper back   Shingles 12/2013   Skin cancer    Basal cell   Past Surgical History:  Procedure Laterality Date   ANTERIOR CERVICAL DECOMP/DISCECTOMY FUSION N/A 03/13/2013   Procedure: ANTERIOR CERVICAL DECOMPRESSION/DISCECTOMY FUSION. Cervical four-five, Cervical five-six;  Surgeon: Clydene Fake, MD;  Location: [facility] NEURO ORS;  Service: Neurosurgery;  Laterality: N/A;   COLONOSCOPY     COLONOSCOPY W/ POLYPECTOMY  2001   negative 2006;[facility]   CYSTOSCOPY  2004   Negative; done for microscopic hematuria,  asymptomatic   HERNIA REPAIR     INSERTION OF MESH N/A 10/10/2020   Procedure: INSERTION OF MESH;  Surgeon: Axel Filler, MD;  Location: [facility] OR;  Service: General;  Laterality: N/A;   POLYPECTOMY     SPINE SURGERY     TONSILLECTOMY AND ADENOIDECTOMY     VASECTOMY     XI ROBOTIC ASSISTED VENTRAL HERNIA N/A 10/10/2020   Procedure: XI ROBOTIC ASSISTED VENTRAL HERNIA REPAIR WITH MESH;  Surgeon: Axel Filler, MD;  Location: [facility] OR;  Service: General;  Laterality: N/A;    reports that he has never smoked. He has never used smokeless tobacco. He reports that he does not currently use alcohol after a past usage of about 7.0 standard drinks of alcohol per week. He reports that he does not use drugs. family history includes Alzheimer's disease in his father and mother; Heart attack (age of onset: 16) in his paternal uncle; Stroke (age of onset: 33) in his paternal grandfather. No Known Allergies    Outpatient Encounter Medications as of 03/26/2023  Medication Sig   apixaban (ELIQUIS) 5 MG TABS tablet Take 1 tablet (5 mg total) by mouth 2 (two) times daily.   benazepril (LOTENSIN) 20 MG tablet Take 1 tablet (20 mg total) by mouth 2 (two) times daily.   citalopram (CELEXA) 20 MG tablet Take 1 tablet (20 mg total) by mouth daily.   diltiazem (DILT-XR) 180 MG 24 hr capsule Take 1 capsule (  180 mg total) by mouth in the morning and at bedtime.   Omega-3 Fatty Acids (FISH OIL) 1200 MG CAPS Take 1,200 mg by mouth daily.   polyethylene glycol (GOLYTELY) 236 g solution Take 4,000 mLs by mouth once for 1 dose.   polyethylene glycol powder (GLYCOLAX/MIRALAX) 17 GM/SCOOP powder Take 17 g by mouth daily as needed for mild constipation.   rosuvastatin (CRESTOR) 40 MG tablet Take 0.5 tablets (20 mg total) by mouth as directed. 3 times a week   flecainide (TAMBOCOR) 100 MG tablet TAKE 3 TABLETS(300MG  TOTAL) BY MOUTH AS NEEDED FOR EPISODE OF AFIB AS DIRECTED (Patient not taking: Reported on 03/26/2023)    omeprazole (PRILOSEC) 20 MG capsule Take 1 capsule (20 mg total) by mouth daily as needed (for heartburn). (Patient not taking: Reported on 03/26/2023)   No facility-administered encounter medications on file as of 03/26/2023.     REVIEW OF SYSTEMS  : All other systems reviewed and negative except where noted in the History of Present Illness.   PHYSICAL EXAM: BP (!) 140/82   Pulse 78   Ht 5\' 11"  (1.803 m)   Wt 212 lb (96.2 kg)   BMI 29.57 kg/m  General: Well developed white male in no acute distress Head: Normocephalic and atraumatic Eyes:  Sclerae anicteric, conjunctiva pink. Ears: Normal auditory acuity Lungs: Clear throughout to auscultation; no W/R/R. Heart: Regular rate and rhythm; no M/R/G. Rectal:  Will be done at the time of colonoscopy. Musculoskeletal: Symmetrical with no gross deformities  Skin: No lesions on visible extremities Extremities:  Some edema noted in B/L LEs Neurological: Alert oriented x 4, grossly non-focal Psychological:  Alert and cooperative. Normal mood and affect  ASSESSMENT AND PLAN: *Personal history of colon polyps on colonoscopy in 2018 with 3 subcentimeter tubular adenomas removed, 5-year recall recommended.  3 subcentimeter tubular adenomas 3 years prior to that.  Incomplete colonoscopy earlier this year due to poor prep.  Needs repeat colonoscopy with GoLytely prep.  He is also using MiraLAX daily for his underlying constipation.  Will schedule with Dr. Myrtie Neither. *Chronic anticoagulation with Eliquis due to history of paroxysmal atrial fibrillation:  Will hold Eliquis for 2 days prior to endoscopic procedures - will instruct when and how to resume after procedure. Benefits and risks of procedure explained including risks of bleeding, perforation, infection, missed lesions, reactions to medications and possible need for hospitalization and surgery for complications. Additional rare but real risk of stroke or other vascular clotting events off of Eliquis  also explained and need to seek urgent help if any signs of these problems occur. Will communicate by phone or EMR with patient's prescribing provider, Dr. Eden Emms, to confirm that holding Eliquis is reasonable in this case.     CC:  Wanda Plump, MD

## 2023-03-27 NOTE — Progress Notes (Signed)
____________________________________________________________  Attending physician addendum:  Thank you for sending this case to me. I have reviewed the entire note and agree with the plan.   Henry Danis, MD  ____________________________________________________________  

## 2023-04-04 ENCOUNTER — Telehealth: Payer: Self-pay | Admitting: *Deleted

## 2023-04-04 NOTE — Telephone Encounter (Signed)
Dr. Eden Emms,  You saw this patient on 02/20/2023. Per office protocol, will you please comment on medical clearance for colonoscopy on 05/06/2023?  Please route your response to P CV DIV Preop. I will communicate with requesting office once you have given recommendations.   Thank you!  Carlos Levering, NP

## 2023-04-04 NOTE — Telephone Encounter (Signed)
[facility] Pre-operative Risk Assessment     Request for surgical clearance:     Endoscopy Procedure  What type of surgery is being performed?     colonoscopy  When is this surgery scheduled?     05/06/2023  What type of clearance is required ?   Pharmacy  Are there any medications that need to be held prior to surgery and how long? Eliquis 2 days  Practice name and name of physician performing surgery?      [facility]  What is your office phone and fax number?      Phone- [phone]  Fax- [phone]  Anesthesia type (None, local, MAC, general) ?       MAC

## 2023-04-04 NOTE — Telephone Encounter (Signed)
Please advise holding Eliquis prior to colonoscopy on 05/06/2023.  Thank you!  DW

## 2023-04-07 DIAGNOSIS — H35033 Hypertensive retinopathy, bilateral: Secondary | ICD-10-CM | POA: Diagnosis not present

## 2023-04-07 DIAGNOSIS — H52223 Regular astigmatism, bilateral: Secondary | ICD-10-CM | POA: Diagnosis not present

## 2023-04-07 DIAGNOSIS — H2513 Age-related nuclear cataract, bilateral: Secondary | ICD-10-CM | POA: Diagnosis not present

## 2023-04-07 NOTE — Telephone Encounter (Signed)
Patient with diagnosis of afib on Eliquis for anticoagulation.    Procedure: colonoscopy Date of procedure: 05/06/23  CHA2DS2-VASc Score = 3  This indicates a 3.2% annual risk of stroke. The patient's score is based upon: CHF History: 0 HTN History: 1 Diabetes History: 0 Stroke History: 0 Vascular Disease History: 0 Age Score: 2 Gender Score: 0   CrCl 34mL/min Platelet count 234K  Per office protocol, patient can hold Eliquis for 2 days prior to procedure as requested.    **This guidance is not considered finalized until pre-operative APP has relayed final recommendations.**

## 2023-04-07 NOTE — Telephone Encounter (Signed)
Name: Rodney Sawyer  DOB: July 28, 1945  MRN: [id number]   Primary Cardiologist: Charlton Haws, MD  Chart reviewed as part of pre-operative protocol coverage. LASALLE CHEAK was last seen on 02/20/2023 by Dr. Eden Emms.  Per Dr. Eden Emms "Okay for colonoscopy."  Therefore, based on ACC/AHA guidelines, the patient would be at acceptable risk for the planned procedure without further cardiovascular testing.   Per Pharm D, patient may hold Eliquis for 2 days prior to procedure.    I will route this recommendation to the requesting party via Epic fax function and remove from pre-op pool. Please call with questions.  Carlos Levering, NP 04/07/2023, 10:56 AM

## 2023-04-08 ENCOUNTER — Other Ambulatory Visit: Payer: Self-pay | Admitting: Pharmacist

## 2023-04-08 DIAGNOSIS — I480 Paroxysmal atrial fibrillation: Secondary | ICD-10-CM

## 2023-04-08 MED ORDER — APIXABAN 5 MG PO TABS: 5.0000 mg | ORAL_TABLET | Freq: Two times a day (BID) | ORAL | 1 refills | Status: DC

## 2023-04-23 ENCOUNTER — Encounter: Payer: Self-pay | Admitting: Gastroenterology

## 2023-04-24 ENCOUNTER — Ambulatory Visit: Payer: Medicare Other | Admitting: Gastroenterology

## 2023-05-01 DIAGNOSIS — C44519 Basal cell carcinoma of skin of other part of trunk: Secondary | ICD-10-CM | POA: Diagnosis not present

## 2023-05-01 DIAGNOSIS — C44722 Squamous cell carcinoma of skin of right lower limb, including hip: Secondary | ICD-10-CM | POA: Diagnosis not present

## 2023-05-01 DIAGNOSIS — Z129 Encounter for screening for malignant neoplasm, site unspecified: Secondary | ICD-10-CM | POA: Diagnosis not present

## 2023-05-01 DIAGNOSIS — B351 Tinea unguium: Secondary | ICD-10-CM | POA: Diagnosis not present

## 2023-05-01 DIAGNOSIS — D485 Neoplasm of uncertain behavior of skin: Secondary | ICD-10-CM | POA: Diagnosis not present

## 2023-05-01 DIAGNOSIS — L218 Other seborrheic dermatitis: Secondary | ICD-10-CM | POA: Diagnosis not present

## 2023-05-05 NOTE — Telephone Encounter (Signed)
Spoke with patient he did hold Eliquis.

## 2023-05-06 ENCOUNTER — Encounter: Payer: Self-pay | Admitting: Gastroenterology

## 2023-05-06 ENCOUNTER — Ambulatory Visit: Payer: Medicare Other | Admitting: Gastroenterology

## 2023-05-06 VITALS — BP 119/75 | HR 67 | Temp 98.4°F | Ht 71.0 in | Wt 212.0 lb

## 2023-05-06 DIAGNOSIS — Z860100 Personal history of colon polyps, unspecified: Secondary | ICD-10-CM

## 2023-05-06 MED ORDER — SODIUM CHLORIDE 0.9 % IV SOLN: 500.0000 mL | INTRAVENOUS | Status: DC

## 2023-05-06 NOTE — Progress Notes (Signed)
Pt reports he has not had a BM today after having AM dose of prep. He was rescheduled previously due to a poor prep. MD discussed options with pt. Plan to stay on clear liquids today and complete a Miralax split dose prep today/tomorrow and complete colonoscopy tomorrow. Prep instructions given and reviewed with pt and care partner.

## 2023-05-07 ENCOUNTER — Ambulatory Visit (AMBULATORY_SURGERY_CENTER): Payer: Medicare Other | Admitting: Gastroenterology

## 2023-05-07 ENCOUNTER — Encounter: Payer: Self-pay | Admitting: Gastroenterology

## 2023-05-07 VITALS — BP 109/52 | HR 72 | Temp 98.0°F | Resp 17 | Ht 71.0 in | Wt 212.0 lb

## 2023-05-07 DIAGNOSIS — Z09 Encounter for follow-up examination after completed treatment for conditions other than malignant neoplasm: Secondary | ICD-10-CM | POA: Diagnosis not present

## 2023-05-07 DIAGNOSIS — D122 Benign neoplasm of ascending colon: Secondary | ICD-10-CM

## 2023-05-07 DIAGNOSIS — Z83719 Family history of colon polyps, unspecified: Secondary | ICD-10-CM

## 2023-05-07 DIAGNOSIS — Z860100 Personal history of colon polyps, unspecified: Secondary | ICD-10-CM | POA: Diagnosis not present

## 2023-05-07 MED ORDER — SODIUM CHLORIDE 0.9 % IV SOLN: 500.0000 mL | Freq: Once | INTRAVENOUS | Status: DC

## 2023-05-07 NOTE — Progress Notes (Signed)
Called to room to assist during endoscopic procedure.  Patient ID and intended procedure confirmed with present staff. Received instructions for my participation in the procedure from the performing physician.  

## 2023-05-07 NOTE — Progress Notes (Signed)
[facility] History and Physical   Primary Care Physician:  Wanda Plump, MD   Reason for Procedure:   H/O polyps. Pt came yesterday for colon- stool. Reprepped  Plan:    colon     HPI: Rodney Sawyer is a 77 y.o. male    Past Medical History:  Diagnosis Date   AF (paroxysmal atrial fibrillation) (HCC)    dx ~ 2006   Depression    Gait difficulty    GERD (gastroesophageal reflux disease)    occasionally   Hepatitis A 1960   containmented water at school when he was a child   Hyperlipidemia    Hypertension    Seborrheic keratosis 12/2015   R midline upper back   Shingles 12/2013   Skin cancer    Basal cell    Past Surgical History:  Procedure Laterality Date   ANTERIOR CERVICAL DECOMP/DISCECTOMY FUSION N/A 03/13/2013   Procedure: ANTERIOR CERVICAL DECOMPRESSION/DISCECTOMY FUSION. Cervical four-five, Cervical five-six;  Surgeon: Clydene Fake, MD;  Location: [facility] NEURO ORS;  Service: Neurosurgery;  Laterality: N/A;   COLONOSCOPY     COLONOSCOPY W/ POLYPECTOMY  2001   negative 2006; GI   CYSTOSCOPY  2004   Negative; done for microscopic hematuria, asymptomatic   HERNIA REPAIR     INSERTION OF MESH N/A 10/10/2020   Procedure: INSERTION OF MESH;  Surgeon: Axel Filler, MD;  Location: [facility] OR;  Service: General;  Laterality: N/A;   POLYPECTOMY     SPINE SURGERY     TONSILLECTOMY AND ADENOIDECTOMY     VASECTOMY     XI ROBOTIC ASSISTED VENTRAL HERNIA N/A 10/10/2020   Procedure: XI ROBOTIC ASSISTED VENTRAL HERNIA REPAIR WITH MESH;  Surgeon: Axel Filler, MD;  Location: [facility] OR;  Service: General;  Laterality: N/A;    Prior to Admission medications   Medication Sig Start Date End Date Taking? Authorizing Provider  benazepril (LOTENSIN) 20 MG tablet Take 1 tablet (20 mg total) by mouth 2 (two) times daily. 11/26/22  Yes Paz, Nolon Rod, MD  citalopram (CELEXA) 20 MG tablet Take 1 tablet (20 mg total) by mouth daily. 03/05/23  Yes Paz, Nolon Rod, MD   clobetasol (TEMOVATE) 0.05 % external solution Apply topically. 05/01/23  Yes [provider]  diltiazem (DILT-XR) 180 MG 24 hr capsule Take 1 capsule (180 mg total) by mouth in the morning and at bedtime. 12/27/22  Yes Paz, Nolon Rod, MD  Omega-3 Fatty Acids (FISH OIL) 1200 MG CAPS Take 1,200 mg by mouth daily.   Yes [provider]  rosuvastatin (CRESTOR) 40 MG tablet Take 0.5 tablets (20 mg total) by mouth as directed. 3 times a week 07/10/22  Yes Paz, Nolon Rod, MD  apixaban (ELIQUIS) 5 MG TABS tablet Take 1 tablet (5 mg total) by mouth 2 (two) times daily. 04/08/23   Wendall Stade, MD  ciclopirox (PENLAC) 8 % solution Apply topically at bedtime. 03/04/23   [provider]  flecainide (TAMBOCOR) 100 MG tablet TAKE 3 TABLETS(300MG  TOTAL) BY MOUTH AS NEEDED FOR EPISODE OF AFIB AS DIRECTED Patient not taking: Reported on 03/26/2023 02/20/23   Wendall Stade, MD  GAVILYTE-G 236 g solution Take by mouth as directed. 03/27/23   [provider]  omeprazole (PRILOSEC) 20 MG capsule Take 1 capsule (20 mg total) by mouth daily as needed (for heartburn). Patient not taking: Reported on 03/26/2023 10/07/22   Wanda Plump, MD    Current Outpatient Medications  Medication Sig Dispense Refill  benazepril (LOTENSIN) 20 MG tablet Take 1 tablet (20 mg total) by mouth 2 (two) times daily. 180 tablet 1   citalopram (CELEXA) 20 MG tablet Take 1 tablet (20 mg total) by mouth daily. 90 tablet 1   clobetasol (TEMOVATE) 0.05 % external solution Apply topically.     diltiazem (DILT-XR) 180 MG 24 hr capsule Take 1 capsule (180 mg total) by mouth in the morning and at bedtime. 180 capsule 1   Omega-3 Fatty Acids (FISH OIL) 1200 MG CAPS Take 1,200 mg by mouth daily.     rosuvastatin (CRESTOR) 40 MG tablet Take 0.5 tablets (20 mg total) by mouth as directed. 3 times a week     apixaban (ELIQUIS) 5 MG TABS tablet Take 1 tablet (5 mg total) by mouth 2 (two) times daily. 180 tablet 1   ciclopirox  (PENLAC) 8 % solution Apply topically at bedtime.     flecainide (TAMBOCOR) 100 MG tablet TAKE 3 TABLETS(300MG  TOTAL) BY MOUTH AS NEEDED FOR EPISODE OF AFIB AS DIRECTED (Patient not taking: Reported on 03/26/2023) 90 tablet 3   GAVILYTE-G 236 g solution Take by mouth as directed.     omeprazole (PRILOSEC) 20 MG capsule Take 1 capsule (20 mg total) by mouth daily as needed (for heartburn). (Patient not taking: Reported on 03/26/2023) 90 capsule 1   Current Facility-Administered Medications  Medication Dose Route Frequency Provider Last Rate Last Admin   0.9 %  sodium chloride infusion  500 mL Intravenous Continuous Danis, Starr Lake III, MD       0.9 %  sodium chloride infusion  500 mL Intravenous Once Lynann Bologna, MD        Allergies as of 05/07/2023   (No Known Allergies)    Family History  Problem Relation Age of Onset   Alzheimer's disease Father    Alzheimer's disease Mother    Heart attack Paternal Uncle 56       sudden death   Stroke Paternal Grandfather 73   Cancer Neg Hx    Diabetes Neg Hx    Colon cancer Neg Hx    Colon polyps Neg Hx    Esophageal cancer Neg Hx    Rectal cancer Neg Hx    Stomach cancer Neg Hx     Social History   Socioeconomic History   Marital status: Married    Spouse name: Britta Mccreedy   Number of children: 1   Years of education: Not on file   Highest education level: Not on file  Occupational History   Occupation: retired-disable, workers comp d/t accident 02-2013    Employer:  [employer]  Tobacco Use   Smoking status: Never   Smokeless tobacco: Never   Tobacco comments:    Smoke rarely in college   Vaping Use   Vaping status: Never Used  Substance and Sexual Activity   Alcohol use: Not Currently    Alcohol/week: 7.0 standard drinks of alcohol    Types: 7 Glasses of wine per week    Comment: socially    Drug use: No   Sexual activity: Not on file  Other Topics Concern   Not on file  Social History Narrative   Daughter: Clinical research associate,  lives [place]    Lives with wife   Social Determinants of Health   Financial Resource Strain: Low Risk  (10/24/2022)   Overall Financial Resource Strain (CARDIA)    Difficulty of Paying Living Expenses: Not hard at all  Food Insecurity: No Food Insecurity (10/24/2022)   Hunger  Vital Sign    Worried About Programme researcher, broadcasting/film/video in the Last Year: Never true    Ran Out of Food in the Last Year: Never true  Transportation Needs: No Transportation Needs (10/24/2022)   PRAPARE - Administrator, Civil Service (Medical): No    Lack of Transportation (Non-Medical): No  Physical Activity: Inactive (10/24/2022)   Exercise Vital Sign    Days of Exercise per Week: 0 days    Minutes of Exercise per Session: 0 min  Stress: No Stress Concern Present (10/24/2022)   [employer] of Occupational Health - Occupational Stress Questionnaire    Feeling of Stress : Not at all  Social Connections: Moderately Integrated (10/24/2022)   Social Connection and Isolation Panel [NHANES]    Frequency of Communication with Friends and Family: More than three times a week    Frequency of Social Gatherings with Friends and Family: Twice a week    Attends Religious Services: 1 to 4 times per year    Active Member of [employer] or Organizations: No    Attends Banker Meetings: Never    Marital Status: Married  Catering manager Violence: Not At Risk (10/24/2022)   Humiliation, Afraid, Rape, and Kick questionnaire    Fear of Current or Ex-Partner: No    Emotionally Abused: No    Physically Abused: No    Sexually Abused: No    Review of Systems: Positive for none All other review of systems negative except as mentioned in the HPI.  Physical Exam: Vital signs in last 24 hours: @VSRANGES @   General:   Alert,  Well-developed, well-nourished, pleasant and cooperative in NAD Lungs:  Clear throughout to auscultation.   Heart:  Regular rate and rhythm; no murmurs, clicks, rubs,  or  gallops. Abdomen:  Soft, nontender and nondistended. Normal bowel sounds.   Neuro/Psych:  Alert and cooperative. Normal mood and affect. A and O x 3    No significant changes were identified.  The patient continues to be an appropriate candidate for the planned procedure and anesthesia.   Edman Circle, MD. [facility] 05/07/2023 11:42 AM@

## 2023-05-07 NOTE — Op Note (Signed)
[facility] Patient Name: Rodney Sawyer Procedure Date: 05/07/2023 11:05 AM MRN: [id number] Endoscopist: Lynann Bologna , MD, [id number] Age: 77 Referring MD:  Date of Birth: April 30, 1946 Gender: Male Account #: [id number] Procedure:                Colonoscopy Indications:              High risk colon cancer surveillance: Personal                            history of colonic polyps Medicines:                Propofol per Anesthesia Procedure:                Pre-Anesthesia Assessment:                           - Prior to the procedure, a History and Physical                            was performed, and patient medications and                            allergies were reviewed. The patient's tolerance of                            previous anesthesia was also reviewed. The risks                            and benefits of the procedure and the sedation                            options and risks were discussed with the patient.                            All questions were answered, and informed consent                            was obtained. Prior Anticoagulants: The patient has                            taken Eliquis (apixaban), last dose was 3 days                            prior to procedure. ASA Grade Assessment: II - A                            patient with mild systemic disease. After reviewing                            the risks and benefits, the patient was deemed in                            satisfactory condition to undergo the procedure.  After obtaining informed consent, the colonoscope                            was passed under direct vision. Throughout the                            procedure, the patient's blood pressure, pulse, and                            oxygen saturations were monitored continuously. The                            CF [id number] #[id number] was introduced through the anus                            and advanced to the  2 cm into the ileum. The                            colonoscopy was performed without difficulty. The                            patient tolerated the procedure well. The quality                            of the bowel preparation was adequate to identify                            polyps. Some areas with retained stool. However,                            cleared by lavage. Overall, the exam was adequate.                            The terminal ileum, ileocecal valve, appendiceal                            orifice, and rectum were photographed. Scope In: 11:11:46 AM Scope Out: 11:33:21 AM Scope Withdrawal Time: 0 hours 12 minutes 7 seconds  Total Procedure Duration: 0 hours 21 minutes 35 seconds  Findings:                 A 6 mm polyp was found in the distal ascending                            colon. The polyp was sessile. The polyp was removed                            with a cold snare. Resection and retrieval were                            complete.                           A few rare (1-2)  small-mouthed diverticula were                            found in the sigmoid colon.                           Non-bleeding internal hemorrhoids were found during                            retroflexion and during perianal exam. The                            hemorrhoids were small and Grade I (internal                            hemorrhoids that do not prolapse).                           The terminal ileum appeared normal.                           The exam was otherwise without abnormality on                            direct and retroflexion views. The colon was                            torturous. Complications:            No immediate complications. Estimated Blood Loss:     Estimated blood loss: none. Impression:               - One 6 mm polyp in the distal ascending colon,                            removed with a cold snare. Resected and retrieved.                           -  Minimal sigmoid diverticulosis                           - Non-bleeding internal hemorrhoids.                           - The examined portion of the ileum was normal.                           - The examination was otherwise normal on direct                            and retroflexion views. The colon was torturous. Recommendation:           - Patient has a contact number available for                            emergencies. The signs and symptoms of potential  delayed complications were discussed with the                            patient. Return to normal activities tomorrow.                            Written discharge instructions were provided to the                            patient.                           - Resume previous diet.                           - Continue present medications.                           - Resume Eliquis (apixaban) at prior dose tomorrow.                           - Await pathology results.                           - Repeat colonoscopy is not recommended for                            surveillance d/t age.                           - The findings and recommendations were discussed                            with the patient's family. Lynann Bologna, MD 05/07/2023 11:42:03 AM This report has been signed electronically.

## 2023-05-07 NOTE — Progress Notes (Signed)
[facility]  Called to room to assist during endoscopic procedure.  Patient ID and intended procedure confirmed with present staff. Received instructions for my participation in the procedure from the performing physician.

## 2023-05-07 NOTE — Patient Instructions (Signed)
Resume Eliquis at prior dose tomorrow.  Awaiting pathology results.  Handouts given on polyps, hemorrhoids, and diverticulosis.  YOU HAD AN ENDOSCOPIC PROCEDURE TODAY AT THE [facility]:   Refer to the procedure report that was given to you for any specific questions about what was found during the examination.  If the procedure report does not answer your questions, please call your gastroenterologist to clarify.  If you requested that your care partner not be given the details of your procedure findings, then the procedure report has been included in a sealed envelope for you to review at your convenience later.  YOU SHOULD EXPECT: Some feelings of bloating in the abdomen. Passage of more gas than usual.  Walking can help get rid of the air that was put into your GI tract during the procedure and reduce the bloating. If you had a lower endoscopy (such as a colonoscopy or flexible sigmoidoscopy) you may notice spotting of blood in your stool or on the toilet paper. If you underwent a bowel prep for your procedure, you may not have a normal bowel movement for a few days.  Please Note:  You might notice some irritation and congestion in your nose or some drainage.  This is from the oxygen used during your procedure.  There is no need for concern and it should clear up in a day or so.  SYMPTOMS TO REPORT IMMEDIATELY:  Following lower endoscopy (colonoscopy or flexible sigmoidoscopy):  Excessive amounts of blood in the stool  Significant tenderness or worsening of abdominal pains  Swelling of the abdomen that is new, acute  Fever of 100F or higher   For urgent or emergent issues, a gastroenterologist can be reached at any hour by calling [phone]. Do not use MyChart messaging for urgent concerns.    DIET:  We do recommend a small meal at first, but then you may proceed to your regular diet.  Drink plenty of fluids but you should avoid alcoholic beverages for 24  hours.  ACTIVITY:  You should plan to take it easy for the rest of today and you should NOT DRIVE or use heavy machinery until tomorrow (because of the sedation medicines used during the test).    FOLLOW UP: Our staff will call the number listed on your records the next business day following your procedure.  We will call around 7:15- 8:00 am to check on you and address any questions or concerns that you may have regarding the information given to you following your procedure. If we do not reach you, we will leave a message.     If any biopsies were taken you will be contacted by phone or by letter within the next 1-3 weeks.  Please call us at [phone] if you have not heard about the biopsies in 3 weeks.    SIGNATURES/CONFIDENTIALITY: You and/or your care partner have signed paperwork which will be entered into your electronic medical record.  These signatures attest to the fact that that the information above on your After Visit Summary has been reviewed and is understood.  Full responsibility of the confidentiality of this discharge information lies with you and/or your care-partner.

## 2023-05-08 ENCOUNTER — Telehealth: Payer: Self-pay

## 2023-05-08 NOTE — Telephone Encounter (Signed)
  Follow up Call-     05/07/2023   10:52 AM 05/07/2023   10:43 AM 05/06/2023    2:17 PM 10/02/2022    2:53 PM  Call back number  Post procedure Call Back phone  # [phone]  [phone]  Permission to leave phone message  Yes Yes Yes     Patient questions:  Do you have a fever, pain , or abdominal swelling? No. Pain Score  0 *  Have you tolerated food without any problems? Yes.    Have you been able to return to your normal activities? Yes.    Do you have any questions about your discharge instructions: Diet   No. Medications  No. Follow up visit  No.  Do you have questions or concerns about your Care? No.  Actions: * If pain score is 4 or above: No action needed, pain <4.

## 2023-05-09 LAB — SURGICAL PATHOLOGY

## 2023-05-16 ENCOUNTER — Encounter: Payer: Self-pay | Admitting: Gastroenterology

## 2023-06-11 DIAGNOSIS — C44722 Squamous cell carcinoma of skin of right lower limb, including hip: Secondary | ICD-10-CM | POA: Diagnosis not present

## 2023-06-17 ENCOUNTER — Ambulatory Visit (INDEPENDENT_AMBULATORY_CARE_PROVIDER_SITE_OTHER): Payer: Medicare Other | Admitting: Medical

## 2023-06-17 VITALS — BP 124/68 | HR 66 | Temp 98.2°F | Resp 18 | Ht 71.0 in | Wt 208.4 lb

## 2023-06-17 DIAGNOSIS — K4090 Unilateral inguinal hernia, without obstruction or gangrene, not specified as recurrent: Secondary | ICD-10-CM | POA: Diagnosis not present

## 2023-06-17 NOTE — Progress Notes (Signed)
Subjective:    Patient ID: Rodney Sawyer, male    DOB: [birth year], 77 y.o.   MRN: [id number]  HPI Discussed the use of AI scribe software for clinical note transcription with the patient, who gave verbal consent to proceed.  History of Present Illness   The patient, with a history of a previous hernia repair, noticed a new bulge in the left groin area approximately two  weeks ago. He describes it as a hernia, similar to the one he had previously, but slightly larger. The patient reports being able to manually reduce the hernia, with no associated pain, nausea, vomiting, fever, or chills. The  hernia was first noticed post-colonoscopy, and the patient has since been managing it without any significant discomfort. The primary concern expressed is the risk of complications, such as strangulation, particularly while traveling.        Review of Systems  Constitutional:  Negative for chills, fatigue and fever.  Respiratory:  Negative for cough, chest tightness, shortness of breath and wheezing.   Cardiovascular:  Negative for chest pain and palpitations.  Gastrointestinal:  Negative for abdominal pain.       See hpi.  Genitourinary:  Negative for dysuria and frequency.  Musculoskeletal:  Negative for back pain and myalgias.  Skin:  Negative for wound.  Neurological:  Negative for dizziness.  Hematological:  Negative for adenopathy. Does not bruise/bleed easily.    Past Medical History:  Diagnosis Date   AF (paroxysmal atrial fibrillation) (HCC)    dx ~ 2006   Depression    Gait difficulty    GERD (gastroesophageal reflux disease)    occasionally   Hepatitis A 1960   containmented water at school when he was a child   Hyperlipidemia    Hypertension    Seborrheic keratosis 12/2015   R midline upper back   Shingles 12/2013   Skin cancer    Basal cell     Social History   Socioeconomic History   Marital status: Married    Spouse name: Britta Mccreedy   Number of children: 1   Years  of education: Not on file   Highest education level: Bachelor's degree (e.g., BA, AB, BS)  Occupational History   Occupation: retired-disable, workers comp d/t accident 02-2013    Employer: [employer]  Tobacco Use   Smoking status: Never   Smokeless tobacco: Never   Tobacco comments:    Smoke rarely in college   Vaping Use   Vaping status: Never Used  Substance and Sexual Activity   Alcohol use: Not Currently    Alcohol/week: 7.0 standard drinks of alcohol    Types: 7 Glasses of wine per week    Comment: socially    Drug use: No   Sexual activity: Not on file  Other Topics Concern   Not on file  Social History Narrative   Daughter: Clinical research associate, lives [place]    Lives with wife   Social Determinants of Health   Financial Resource Strain: Low Risk  (06/16/2023)   Overall Financial Resource Strain (CARDIA)    Difficulty of Paying Living Expenses: Not hard at all  Food Insecurity: No Food Insecurity (06/16/2023)   Hunger Vital Sign    Worried About Running Out of Food in the Last Year: Never true    Ran Out of Food in the Last Year: Never true  Transportation Needs: No Transportation Needs (06/16/2023)   PRAPARE - Transportation    Lack of Transportation (Medical): No    Lack  of Transportation (Non-Medical): No  Physical Activity: Insufficiently Active (06/16/2023)   Exercise Vital Sign    Days of Exercise per Week: 1 day    Minutes of Exercise per Session: 30 min  Stress: No Stress Concern Present (06/16/2023)   [employer] of Occupational Health - Occupational Stress Questionnaire    Feeling of Stress : Not at all  Social Connections: Moderately Isolated (06/16/2023)   Social Connection and Isolation Panel [NHANES]    Frequency of Communication with Friends and Family: More than three times a week    Frequency of Social Gatherings with Friends and Family: Once a week    Attends Religious Services: Never    Database administrator or Organizations: No    Attends Occupational hygienist Meetings: Never    Marital Status: Married  Catering manager Violence: Not At Risk (10/24/2022)   Humiliation, Afraid, Rape, and Kick questionnaire    Fear of Current or Ex-Partner: No    Emotionally Abused: No    Physically Abused: No    Sexually Abused: No    Past Surgical History:  Procedure Laterality Date   ANTERIOR CERVICAL DECOMP/DISCECTOMY FUSION N/A 03/13/2013   Procedure: ANTERIOR CERVICAL DECOMPRESSION/DISCECTOMY FUSION. Cervical four-five, Cervical five-six;  Surgeon: Clydene Fake, MD;  Location: [facility] NEURO ORS;  Service: Neurosurgery;  Laterality: N/A;   COLONOSCOPY     COLONOSCOPY W/ POLYPECTOMY  2001   negative 2006;[facility]   CYSTOSCOPY  2004   Negative; done for microscopic hematuria, asymptomatic   HERNIA REPAIR     INSERTION OF MESH N/A 10/10/2020   Procedure: INSERTION OF MESH;  Surgeon: Axel Filler, MD;  Location: [facility] OR;  Service: General;  Laterality: N/A;   POLYPECTOMY     SPINE SURGERY     TONSILLECTOMY AND ADENOIDECTOMY     VASECTOMY     XI ROBOTIC ASSISTED VENTRAL HERNIA N/A 10/10/2020   Procedure: XI ROBOTIC ASSISTED VENTRAL HERNIA REPAIR WITH MESH;  Surgeon: Axel Filler, MD;  Location: [facility] OR;  Service: General;  Laterality: N/A;    Family History  Problem Relation Age of Onset   Alzheimer's disease Father    Alzheimer's disease Mother    Heart attack Paternal Uncle 35       sudden death   Stroke Paternal Grandfather 74   Cancer Neg Hx    Diabetes Neg Hx    Colon cancer Neg Hx    Colon polyps Neg Hx    Esophageal cancer Neg Hx    Rectal cancer Neg Hx    Stomach cancer Neg Hx     No Known Allergies  Current Outpatient Medications on File Prior to Visit  Medication Sig Dispense Refill   apixaban (ELIQUIS) 5 MG TABS tablet Take 1 tablet (5 mg total) by mouth 2 (two) times daily. 180 tablet 1   benazepril (LOTENSIN) 20 MG tablet Take 1 tablet (20 mg total) by mouth 2 (two) times daily. 180 tablet 1   ciclopirox  (PENLAC) 8 % solution Apply topically at bedtime.     citalopram (CELEXA) 20 MG tablet Take 1 tablet (20 mg total) by mouth daily. 90 tablet 1   clobetasol (TEMOVATE) 0.05 % external solution Apply topically.     diltiazem (DILT-XR) 180 MG 24 hr capsule Take 1 capsule (180 mg total) by mouth in the morning and at bedtime. 180 capsule 1   flecainide (TAMBOCOR) 100 MG tablet TAKE 3 TABLETS(300MG  TOTAL) BY MOUTH AS NEEDED FOR EPISODE OF AFIB AS DIRECTED (Patient  not taking: Reported on 03/26/2023) 90 tablet 3   GAVILYTE-G 236 g solution Take by mouth as directed.     Omega-3 Fatty Acids (FISH OIL) 1200 MG CAPS Take 1,200 mg by mouth daily.     omeprazole (PRILOSEC) 20 MG capsule Take 1 capsule (20 mg total) by mouth daily as needed (for heartburn). (Patient not taking: Reported on 03/26/2023) 90 capsule 1   rosuvastatin (CRESTOR) 40 MG tablet Take 0.5 tablets (20 mg total) by mouth as directed. 3 times a week     No current facility-administered medications on file prior to visit.    BP 124/68   Pulse 66   Temp 98.2 F (36.8 C)   Resp 18   Ht 5\' 11"  (1.803 m)   Wt 208 lb 6.4 oz (94.5 kg)   SpO2 99%   BMI 29.07 kg/m        Objective:   Physical Exam  General Mental Status- Alert. General Appearance- Not in acute distress.   \ Neck Carotid Arteries- Normal color. Moisture- Normal Moisture. No carotid bruits. No JVD.  Chest and Lung Exam Auscultation: Breath Sounds:-Normal.  Cardiovascular Auscultation:Rythm- Regular. Murmurs & Other Heart Sounds:Auscultation of the heart reveals- No Murmurs.  Abdomen Inspection:-Inspeection Normal. Palpation/Percussion:Note:No mass. Palpation and Percussion of the abdomen reveal- Non Tender, Non Distended + BS, no rebound or guarding.   Neurologic Cranial Nerve exam:- CN III-XII intact(No nystagmus), symmetric smile. Strength:- 5/5 equal and symmetric strength both upper and lower extremities.   Genital- left inguinal canal at top  portion feels like small lump/bulge about 2.5 cm length      Assessment & Plan:   Assessment and Plan    Probable inguinal Hernia(vs other differential) Patient reports a bulge in the groin area for the past 2 weeks, which he is able to manually reduce. No associated pain, nausea, vomiting, or fever. On examination, a high-riding early hernia is palpated. -Refer to general surgery for further evaluation. -Advise patient to seek immediate medical attention if he experiences severe pain, nausea, vomiting, or fever, as these could be signs of hernia complications such as strangulation or incarceration.      Follow up as regularly scheduled with pcp or sooner if needed  [employer], PA-C

## 2023-06-17 NOTE — Patient Instructions (Addendum)
Probable inguinal Hernia(vs other differential) Patient reports a bulge in the groin area for the past 2 weeks, which he is able to manually reduce. No associated pain, nausea, vomiting, or fever. On examination, a high-riding early hernia is palpated. -Refer to general surgery for further evaluation. -Advise patient to seek immediate medical attention if he experiences severe pain, nausea, vomiting, or fever, as these could be signs of hernia complications such as strangulation or incarceration.  Follow up as regularly scheduled with pcp or sooner if needed

## 2023-07-02 DIAGNOSIS — K4090 Unilateral inguinal hernia, without obstruction or gangrene, not specified as recurrent: Secondary | ICD-10-CM | POA: Diagnosis not present

## 2023-07-11 ENCOUNTER — Telehealth: Payer: Self-pay | Admitting: *Deleted

## 2023-07-11 NOTE — Telephone Encounter (Signed)
Patient with diagnosis of afib on Eliquis for anticoagulation.    Procedure: HERNIA SURGERY  Date of procedure: TBD   CHA2DS2-VASc Score = 3   This indicates a 3.2% annual risk of stroke. The patient's score is based upon: CHF History: 0 HTN History: 1 Diabetes History: 0 Stroke History: 0 Vascular Disease History: 0 Age Score: 2 Gender Score: 0     CrCl 82 mL/min Platelet count 234 (11/14/2022)    Per office protocol, patient can hold Eliquis for 2 days prior to procedure.     **This guidance is not considered finalized until pre-operative APP has relayed final recommendations.**

## 2023-07-11 NOTE — Telephone Encounter (Signed)
   Name: Rodney Sawyer  DOB: 1946-07-01  MRN: [id number]  Primary Cardiologist: Charlton Haws, MD  Preoperative team, please contact this patient and set up a phone call appointment for further preoperative risk assessment. Please obtain consent and complete medication review. Thank you for your help.  I confirm that guidance regarding antiplatelet and oral anticoagulation therapy has been completed and, if necessary, noted below.  Per office protocol, patient can hold Eliquis for 2 days prior to procedure.    I also confirmed the patient resides in the state of [place]. As per [facility] Medical Board telemedicine laws, the patient must reside in the state in which the provider is licensed.   Rip Harbour, NP 07/11/2023, 1:12 PM [facility]

## 2023-07-11 NOTE — Telephone Encounter (Signed)
S/w the pt and he has been scheduled 07/25/23 preop tele appt. Med rec and consent are done.     Patient Consent for Virtual Visit        Rodney Sawyer has provided verbal consent on 07/11/2023 for a virtual visit (video or telephone).   CONSENT FOR VIRTUAL VISIT FOR:  Rodney Sawyer  By participating in this virtual visit I agree to the following:  I hereby voluntarily request, consent and authorize [facility] and its employed or contracted physicians, physician assistants, nurse practitioners or other licensed health care professionals (the Practitioner), to provide me with telemedicine health care services (the "Services") as deemed necessary by the treating Practitioner. I acknowledge and consent to receive the Services by the Practitioner via telemedicine. I understand that the telemedicine visit will involve communicating with the Practitioner through live audiovisual communication technology and the disclosure of certain medical information by electronic transmission. I acknowledge that I have been given the opportunity to request an in-person assessment or other available alternative prior to the telemedicine visit and am voluntarily participating in the telemedicine visit.  I understand that I have the right to withhold or withdraw my consent to the use of telemedicine in the course of my care at any time, without affecting my right to future care or treatment, and that the Practitioner or I may terminate the telemedicine visit at any time. I understand that I have the right to inspect all information obtained and/or recorded in the course of the telemedicine visit and may receive copies of available information for a reasonable fee.  I understand that some of the potential risks of receiving the Services via telemedicine include:  Delay or interruption in medical evaluation due to technological equipment failure or disruption; Information transmitted may not be sufficient (e.g.  poor resolution of images) to allow for appropriate medical decision making by the Practitioner; and/or  In rare instances, security protocols could fail, causing a breach of personal health information.  Furthermore, I acknowledge that it is my responsibility to provide information about my medical history, conditions and care that is complete and accurate to the best of my ability. I acknowledge that Practitioner's advice, recommendations, and/or decision may be based on factors not within their control, such as incomplete or inaccurate data provided by me or distortions of diagnostic images or specimens that may result from electronic transmissions. I understand that the practice of medicine is not an exact science and that Practitioner makes no warranties or guarantees regarding treatment outcomes. I acknowledge that a copy of this consent can be made available to me via my patient portal [facility] MyChart), or I can request a printed copy by calling the office of [facility].    I understand that my insurance will be billed for this visit.   I have read or had this consent read to me. I understand the contents of this consent, which adequately explains the benefits and risks of the Services being provided via telemedicine.  I have been provided ample opportunity to ask questions regarding this consent and the Services and have had my questions answered to my satisfaction. I give my informed consent for the services to be provided through the use of telemedicine in my medical care

## 2023-07-11 NOTE — Telephone Encounter (Signed)
   Pre-operative Risk Assessment    Patient Name: Rodney Sawyer  DOB: 01-30-1946 MRN: [id number]   Date of last office visit: 02/20/23 DR. NISHAN Date of next office visit: NONE   Request for Surgical Clearance    Procedure:   HERNIA SURGERY  Date of Surgery:  Clearance TBD                                Surgeon:  DR. Twana First Surgeon's Group or Practice Name:  [facility] Phone number:  [phone] Fax number:  [phone] ATTN: Scarlett Presto, CMA   Type of Clearance Requested:   - Medical  - Pharmacy:  Hold Apixaban (Eliquis)     Type of Anesthesia:  General    Additional requests/questions:    Elpidio Anis   07/11/2023, 12:01 PM

## 2023-07-11 NOTE — Telephone Encounter (Signed)
S/w the pt and he has been scheduled 07/25/23 preop tele appt. Med rec and consent are done.

## 2023-07-18 ENCOUNTER — Other Ambulatory Visit: Payer: Self-pay | Admitting: Internal Medicine

## 2023-07-18 ENCOUNTER — Other Ambulatory Visit: Payer: Self-pay | Admitting: Cardiovascular Disease

## 2023-07-18 DIAGNOSIS — I480 Paroxysmal atrial fibrillation: Secondary | ICD-10-CM

## 2023-07-18 NOTE — Telephone Encounter (Signed)
 Eliquis 5mg  refill request received. Patient is 78 years old, weight-94.5kg, Crea-1.0 on 11/13/22, Diagnosis-Afib, and last seen by Dr. Eden Emms on 02/20/23. Dose is appropriate based on dosing criteria. Will send in refill to requested pharmacy.

## 2023-07-25 ENCOUNTER — Encounter: Payer: Medicare Other | Admitting: Internal Medicine

## 2023-07-25 ENCOUNTER — Ambulatory Visit: Payer: Medicare Other | Attending: Cardiology | Admitting: Student

## 2023-07-25 DIAGNOSIS — Z01810 Encounter for preprocedural cardiovascular examination: Secondary | ICD-10-CM | POA: Diagnosis not present

## 2023-07-25 NOTE — Progress Notes (Signed)
 Virtual Visit via Telephone Note   Because of Rodney Sawyer's co-morbid illnesses, he is at least at moderate risk for complications without adequate follow up.  This format is felt to be most appropriate for this patient at this time.  The patient did not have access to video technology/had technical difficulties with video requiring transitioning to audio format only (telephone).  All issues noted in this document were discussed and addressed.  No physical exam could be performed with this format.  Please refer to the patient's chart for his consent to telehealth for [facility].  Evaluation Performed:  Preoperative cardiovascular risk assessment _____________   Date:  07/25/2023   Patient ID:  Rodney Sawyer, DOB 05-Mar-1946, MRN [id number] Patient Location:  Home Provider location:   Office  Primary Care Provider:  Amon Aloysius BRAVO, MD Primary Cardiologist:  Rodney Emmer, MD  Chief Complaint / Patient Profile   78 y.o. y/o male with a h/o PAF on anticoagulation, hypertension, hyperlipidemia who is pending hernia repair by Dr. Cameron and presents today for telephonic preoperative cardiovascular risk assessment.  History of Present Illness    Rodney Sawyer is a 78 y.o. male who presents via audio/video conferencing for a telehealth visit today.  Pt was last seen in cardiology clinic on 02/20/2023 by Dr. Emmer.  At that time Rodney Sawyer was stable from a cardiac standpoint.  The patient is now pending procedure as outlined above. Since his last visit, he is doing well. Patient denies shortness of breath, dyspnea on exertion, lower extremity edema, orthopnea or PND. No chest pain, pressure, or tightness. No palpitations.  Activity is limited but he is able to perform light to moderate household activities.   Past Medical History    Past Medical History:  Diagnosis Date   AF (paroxysmal atrial fibrillation) (HCC)    dx ~ 2006   Depression    Gait difficulty    GERD  (gastroesophageal reflux disease)    occasionally   Hepatitis A 1960   containmented water  at school when he was a child   Hyperlipidemia    Hypertension    Seborrheic keratosis 12/2015   R midline upper back   Shingles 12/2013   Skin cancer    Basal cell   Past Surgical History:  Procedure Laterality Date   ANTERIOR CERVICAL DECOMP/DISCECTOMY FUSION N/A 03/13/2013   Procedure: ANTERIOR CERVICAL DECOMPRESSION/DISCECTOMY FUSION. Cervical four-five, Cervical five-six;  Surgeon: Lynwood JONELLE Mill, MD;  Location: [facility] NEURO ORS;  Service: Neurosurgery;  Laterality: N/A;   COLONOSCOPY     COLONOSCOPY W/ POLYPECTOMY  2001   negative 2006;[facility]   CYSTOSCOPY  2004   Negative; done for microscopic hematuria, asymptomatic   HERNIA REPAIR     INSERTION OF MESH N/A 10/10/2020   Procedure: INSERTION OF MESH;  Surgeon: Rubin Calamity, MD;  Location: [facility] OR;  Service: General;  Laterality: N/A;   POLYPECTOMY     SPINE SURGERY     TONSILLECTOMY AND ADENOIDECTOMY     VASECTOMY     XI ROBOTIC ASSISTED VENTRAL HERNIA N/A 10/10/2020   Procedure: XI ROBOTIC ASSISTED VENTRAL HERNIA REPAIR WITH MESH;  Surgeon: Rubin Calamity, MD;  Location: [facility] OR;  Service: General;  Laterality: N/A;    Allergies  No Known Allergies  Home Medications    Prior to Admission medications   Medication Sig Start Date End Date Taking? Authorizing Provider  benazepril  (LOTENSIN ) 20 MG tablet Take 1 tablet (20 mg total) by mouth  2 (two) times daily. 11/26/22   Paz, Jose E, MD  ciclopirox (PENLAC) 8 % solution Apply topically at bedtime. 03/04/23   [provider]  citalopram  (CELEXA ) 20 MG tablet Take 1 tablet (20 mg total) by mouth daily. 03/05/23   Paz, Jose E, MD  clobetasol (TEMOVATE) 0.05 % external solution Apply topically. 05/01/23   [provider]  diltiazem  (DILT-XR) 180 MG 24 hr capsule Take 1 capsule (180 mg total) by mouth 2 (two) times daily. 07/18/23   Paz, Jose E, MD  Rodney Sawyer  5 MG TABS  tablet TAKE 1 TABLET BY MOUTH 2 TIMES DAILY. 07/18/23   Nishan, Peter C, MD  flecainide  (TAMBOCOR ) 100 MG tablet TAKE 3 TABLETS(300MG  TOTAL) BY MOUTH AS NEEDED FOR EPISODE OF AFIB AS DIRECTED 02/20/23   Sawyer Rodney BROCKS, MD  GAVILYTE-G 236 g solution Take by mouth as directed. 03/27/23   [provider]  Omega-3 Fatty Acids (FISH OIL) 1200 MG CAPS Take 1,200 mg by mouth daily.    [provider]  omeprazole  (PRILOSEC) 20 MG capsule Take 1 capsule (20 mg total) by mouth daily as needed (for heartburn). 10/07/22   Rodney Aloysius BRAVO, MD  rosuvastatin  (CRESTOR ) 40 MG tablet Take 0.5 tablets (20 mg total) by mouth as directed. 3 times a week 07/10/22   Rodney Aloysius BRAVO, MD    Physical Exam    Vital Signs:  Rodney Sawyer does not have vital signs available for review today.  Given telephonic nature of communication, physical exam is limited. AAOx3. NAD. Normal affect.  Speech and respirations are unlabored.   Assessment & Plan    Primary Cardiologist: Rodney Delford, MD  Preoperative cardiovascular risk assessment.  Hernia repair by Dr. Cameron.  Chart reviewed as part of pre-operative protocol coverage. According to the RCRI, patient has a 0.4% risk of MACE. Patient reports activity equivalent to 4.0 METS (light to moderate household chores).   Given past medical history and time since last visit, based on ACC/AHA guidelines, Rodney Sawyer would be at acceptable risk for the planned procedure without further cardiovascular testing.   Patient was advised that if he develops new symptoms prior to surgery to contact our office to arrange a follow-up appointment.  he verbalized understanding.  Per Pharm D, patient may hold Rodney Sawyer  for 2 days prior to procedure.    I will route this recommendation to the requesting party via Epic fax function.  Please call with questions.  Time:   Today, I have spent 6 minutes with the patient with telehealth technology discussing medical history, symptoms,  and management plan.     Barnie Hila, NP  07/25/2023, 8:30 AM

## 2023-08-07 ENCOUNTER — Ambulatory Visit: Payer: Self-pay | Admitting: Surgery

## 2023-08-07 NOTE — Patient Instructions (Signed)
SURGICAL WAITING ROOM VISITATION  Patients having surgery or a procedure may have no more than 2 support people in the waiting area - these visitors may rotate.    Children under the age of 67 must have an adult with them who is not the patient.  Due to an increase in RSV and influenza rates and associated hospitalizations, children ages 9 and under may not visit patients in [facility] hospitals.  Visitors with respiratory illnesses are discouraged from visiting and should remain at home.  If the patient needs to stay at the hospital during part of their recovery, the visitor guidelines for inpatient rooms apply. Pre-op nurse will coordinate an appropriate time for 1 support person to accompany patient in pre-op.  This support person may not rotate.    Please refer to the [facility] website for the visitor guidelines for Inpatients (after your surgery is over and you are in a regular room).       Your procedure is scheduled on:  08/15/2023    Report to [facility] Main Entrance    Report to admitting at  0730 AM   Call this number if you have problems the morning of surgery [phone]   Do not eat food  or drink liquids :After Midnight.               If you have questions, please contact your surgeon's office.       Oral Hygiene is also important to reduce your risk of infection.                                    Remember - BRUSH YOUR TEETH THE MORNING OF SURGERY WITH YOUR REGULAR TOOTHPASTE  DENTURES WILL BE REMOVED PRIOR TO SURGERY PLEASE DO NOT APPLY "Poly grip" OR ADHESIVES!!!   Do NOT smoke after Midnight   Stop all vitamins and herbal supplements 7 days before surgery.   Take these medicines the morning of surgery with A SIP OF WATER:  Celexa, Diltizaem, Tambocor if needed, Omeprazole if needed   DO NOT TAKE ANY ORAL DIABETIC MEDICATIONS DAY OF YOUR SURGERY  Bring CPAP mask and tubing day of surgery.                              You may not have  any metal on your body including hair pins, jewelry, and body piercing             Do not wear make-up, lotions, powders, perfumes/cologne, or deodorant  Do not wear nail polish including gel and S&S, artificial/acrylic nails, or any other type of covering on natural nails including finger and toenails. If you have artificial nails, gel coating, etc. that needs to be removed by a nail salon please have this removed prior to surgery or surgery may need to be canceled/ delayed if the surgeon/ anesthesia feels like they are unable to be safely monitored.   Do not shave  48 hours prior to surgery.               Men may shave face and neck.   Do not bring valuables to the hospital. [place] IS NOT             RESPONSIBLE   FOR VALUABLES.   Contacts, glasses, dentures or bridgework may not be worn into surgery.  Bring small overnight bag day of surgery.   DO NOT BRING YOUR HOME MEDICATIONS TO THE HOSPITAL. PHARMACY WILL DISPENSE MEDICATIONS LISTED ON YOUR MEDICATION LIST TO YOU DURING YOUR ADMISSION IN THE HOSPITAL!    Patients discharged on the day of surgery will not be allowed to drive home.  Someone NEEDS to stay with you for the first 24 hours after anesthesia.   Special Instructions: Bring a copy of your healthcare power of attorney and living will documents the day of surgery if you haven't scanned them before.              Please read over the following fact sheets you were given: IF YOU HAVE QUESTIONS ABOUT YOUR PRE-OP INSTRUCTIONS PLEASE CALL [phone]   If you received a COVID test during your pre-op visit  it is requested that you wear a mask when out in public, stay away from anyone that may not be feeling well and notify your surgeon if you develop symptoms. If you test positive for Covid or have been in contact with anyone that has tested positive in the last 10 days please notify you surgeon.    [place] - Preparing for Surgery Before surgery, you can play an  important role.  Because skin is not sterile, your skin needs to be as free of germs as possible.  You can reduce the number of germs on your skin by washing with CHG (chlorahexidine gluconate) soap before surgery.  CHG is an antiseptic cleaner which kills germs and bonds with the skin to continue killing germs even after washing. Please DO NOT use if you have an allergy to CHG or antibacterial soaps.  If your skin becomes reddened/irritated stop using the CHG and inform your nurse when you arrive at Short Stay. Do not shave (including legs and underarms) for at least 48 hours prior to the first CHG shower.  You may shave your face/neck. Please follow these instructions carefully:  1.  Shower with CHG Soap the night before surgery and the  morning of Surgery.  2.  If you choose to wash your hair, wash your hair first as usual with your  normal  shampoo.  3.  After you shampoo, rinse your hair and body thoroughly to remove the  shampoo.                           4.  Use CHG as you would any other liquid soap.  You can apply chg directly  to the skin and wash                       Gently with a scrungie or clean washcloth.  5.  Apply the CHG Soap to your body ONLY FROM THE NECK DOWN.   Do not use on face/ open                           Wound or open sores. Avoid contact with eyes, ears mouth and genitals (private parts).                       Wash face,  Genitals (private parts) with your normal soap.             6.  Wash thoroughly, paying special attention to the area where your surgery  will be performed.  7.  Thoroughly rinse your body  with warm water from the neck down.  8.  DO NOT shower/wash with your normal soap after using and rinsing off  the CHG Soap.                9.  Pat yourself dry with a clean towel.            10.  Wear clean pajamas.            11.  Place clean sheets on your bed the night of your first shower and do not  sleep with pets. Day of Surgery : Do not apply any  lotions/deodorants the morning of surgery.  Please wear clean clothes to the hospital/surgery center.  FAILURE TO FOLLOW THESE INSTRUCTIONS MAY RESULT IN THE CANCELLATION OF YOUR SURGERY PATIENT SIGNATURE_________________________________  NURSE SIGNATURE__________________________________  ________________________________________________________________________
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
## 2023-08-07 NOTE — H&P (View-Only) (Signed)
 Rodney Sawyer [id number]   Referring Provider: Wanda Plump, MD  Subjective   Chief Complaint: Post Operative Visit (Hernia consult)   History of Present Illness: 78 year old man with history of atrial fibrillation, depression, GERD, hypertension, hyperlipidemia, and history of robot-assisted ventral (right-sided spigelian) hernia repair with (preperitoneal ProGrip) mesh by Dr. Derrell Lolling in 2022 referred for evaluation of a new left inguinal hernia which he noticed about a month ago. This has been reducible. No associated pain, nausea, vomiting, fever or chills. No associated pain. He has had some anxiety about the possibility of bowel incarceration or other emergency scenario.  His wife had a fall recently and has a left upper extremity fracture, and he is currently having to help her significantly with ADLs. He himself has gait instability and walks with a cane.  Review of Systems: A complete review of systems was obtained from the patient. I have reviewed this information and discussed as appropriate with the patient. See HPI as well for other ROS.  Medical History: Past Medical History:  Diagnosis Date  Arrhythmia  GERD (gastroesophageal reflux disease)  Hyperlipidemia  Hypertension   There is no problem list on file for this patient.  Past Surgical History:  Procedure Laterality Date  HERNIA REPAIR  spine surgery  TONSILLECTOMY & ADENOIDECTOMY  VASECTOMY    No Known Allergies  Current Outpatient Medications on File Prior to Visit  Medication Sig Dispense Refill  benazepriL (LOTENSIN) 20 MG tablet Take 20 mg by mouth 2 (two) times daily  citalopram (CELEXA) 20 MG tablet Take 20 mg by mouth once daily  dilTIAZem (DILT-XR) 180 MG XR capsule Take 180 mg by mouth 2 (two) times daily  ELIQUIS 5 mg tablet Take 5 mg by mouth 2 (two) times daily  flecainide (TAMBOCOR) 100 MG tablet TAKE 3 TABLETS(300MG  TOTAL) BY MOUTH AS NEEDED FOR EPISODE OF AFIB AS DIRECTED  omega-3 fatty  acids-fish oil 360-1,200 mg Cap Take 1,200 mg by mouth once daily  polyethylene glycol 3350 (MIRALAX ORAL) Take by mouth  rosuvastatin (CRESTOR) 40 MG tablet Take 20 mg by mouth   No current facility-administered medications on file prior to visit.   Family History  Problem Relation Age of Onset  Hyperlipidemia (Elevated cholesterol) Mother  High blood pressure (Hypertension) Father    Social History   Tobacco Use  Smoking Status Never  Smokeless Tobacco Never    Social History   Socioeconomic History  Marital status: Married  Tobacco Use  Smoking status: Never  Smokeless tobacco: Never  Vaping Use  Vaping status: Never Used  Substance and Sexual Activity  Alcohol use: Not Currently  Drug use: Never   Social Drivers of Health   Financial Resource Strain: Low Risk (06/16/2023)  Received from [facility]  Overall Financial Resource Strain (CARDIA)  Difficulty of Paying Living Expenses: Not hard at all  Food Insecurity: No Food Insecurity (06/16/2023)  Received from [facility]  Hunger Vital Sign  Worried About Running Out of Food in the Last Year: Never true  Ran Out of Food in the Last Year: Never true  Transportation Needs: No Transportation Needs (06/16/2023)  Received from [facility] - Transportation  Lack of Transportation (Medical): No  Lack of Transportation (Non-Medical): No  Physical Activity: Insufficiently Active (06/16/2023)  Received from [facility]  Exercise Vital Sign  Days of Exercise per Week: 1 day  Minutes of Exercise per Session: 30 min  Stress: No Stress Concern Present (06/16/2023)  Received from [facility]  [employer] of Occupational Health - Occupational Stress Questionnaire  Feeling of Stress : Not at all  Social Connections: Moderately Isolated (06/16/2023)  Received from [facility]  Social Connection and Isolation Panel [NHANES]  Frequency of Communication with Friends and Family: More than three times a week   Frequency of Social Gatherings with Friends and Family: Once a week  Attends Religious Services: Never  Database administrator or Organizations: No  Attends Banker Meetings: Never  Marital Status: Married   Objective:   Vitals:  07/02/23 1529  BP: (!) 140/86  Pulse: 75  Temp: 36.7 C (98 F)  SpO2: (!) 87%  Weight: 92.8 kg (204 lb 9.6 oz)  Height: 180.3 cm (5\' 11" )  PainSc: 0-No pain   Body mass index is 28.54 kg/m.  Gen: A&Ox3, no distress  Unlabored respirations Diminished soft, nontender, nondistended. Moderate left inguinal hernia. No hernia on the right.  Assessment and Plan:  Diagnoses and all orders for this visit:  Non-recurrent unilateral inguinal hernia without obstruction or gangrene   We discussed the relevant anatomy and we discussed options for repair. I recommend an open approach and went over the technique of the procedure. Discussed risks of bleeding, infection, pain, scarring, injury to structures in the area including nerves, blood vessels, bowel, bladder, risk of chronic pain, hernia recurrence, risk of seroma or hematoma, urinary retention, and risks of general anesthesia including cardiovascular, pulmonary, and thromboembolic complications. We discussed typical postop recovery, timeline, and activity limitations. We also discussed the option of ongoing observation, with high rate of ultimately returning for surgery and risk of increasing size/symptoms from the hernia as well as incarceration/strangulation and went over symptoms that should prompt him to seek emergency treatment. Questions were come and answered to the patient's satisfaction. Patient wishes to proceed with scheduling, but would like to wait until his wife is recovered somewhat so that she can be more independent and I think that that is completely reasonable. We will tentatively plan to schedule surgery in early March.   Amanii Snethen Carlye Grippe, MD

## 2023-08-07 NOTE — H&P (Signed)
Rodney Sawyer [id number]   Referring Provider: Wanda Plump, MD  Subjective   Chief Complaint: Post Operative Visit (Hernia consult)   History of Present Illness: 78 year old man with history of atrial fibrillation, depression, GERD, hypertension, hyperlipidemia, and history of robot-assisted ventral (right-sided spigelian) hernia repair with (preperitoneal ProGrip) mesh by Dr. Derrell Lolling in 2022 referred for evaluation of a new left inguinal hernia which he noticed about a month ago. This has been reducible. No associated pain, nausea, vomiting, fever or chills. No associated pain. He has had some anxiety about the possibility of bowel incarceration or other emergency scenario.  His wife had a fall recently and has a left upper extremity fracture, and he is currently having to help her significantly with ADLs. He himself has gait instability and walks with a cane.  Review of Systems: A complete review of systems was obtained from the patient. I have reviewed this information and discussed as appropriate with the patient. See HPI as well for other ROS.  Medical History: Past Medical History:  Diagnosis Date  Arrhythmia  GERD (gastroesophageal reflux disease)  Hyperlipidemia  Hypertension   There is no problem list on file for this patient.  Past Surgical History:  Procedure Laterality Date  HERNIA REPAIR  spine surgery  TONSILLECTOMY & ADENOIDECTOMY  VASECTOMY    No Known Allergies  Current Outpatient Medications on File Prior to Visit  Medication Sig Dispense Refill  benazepriL (LOTENSIN) 20 MG tablet Take 20 mg by mouth 2 (two) times daily  citalopram (CELEXA) 20 MG tablet Take 20 mg by mouth once daily  dilTIAZem (DILT-XR) 180 MG XR capsule Take 180 mg by mouth 2 (two) times daily  ELIQUIS 5 mg tablet Take 5 mg by mouth 2 (two) times daily  flecainide (TAMBOCOR) 100 MG tablet TAKE 3 TABLETS(300MG  TOTAL) BY MOUTH AS NEEDED FOR EPISODE OF AFIB AS DIRECTED  omega-3 fatty  acids-fish oil 360-1,200 mg Cap Take 1,200 mg by mouth once daily  polyethylene glycol 3350 (MIRALAX ORAL) Take by mouth  rosuvastatin (CRESTOR) 40 MG tablet Take 20 mg by mouth   No current facility-administered medications on file prior to visit.   Family History  Problem Relation Age of Onset  Hyperlipidemia (Elevated cholesterol) Mother  High blood pressure (Hypertension) Father    Social History   Tobacco Use  Smoking Status Never  Smokeless Tobacco Never    Social History   Socioeconomic History  Marital status: Married  Tobacco Use  Smoking status: Never  Smokeless tobacco: Never  Vaping Use  Vaping status: Never Used  Substance and Sexual Activity  Alcohol use: Not Currently  Drug use: Never   Social Drivers of Health   Financial Resource Strain: Low Risk (06/16/2023)  Received from [facility]  Overall Financial Resource Strain (CARDIA)  Difficulty of Paying Living Expenses: Not hard at all  Food Insecurity: No Food Insecurity (06/16/2023)  Received from [facility]  Hunger Vital Sign  Worried About Running Out of Food in the Last Year: Never true  Ran Out of Food in the Last Year: Never true  Transportation Needs: No Transportation Needs (06/16/2023)  Received from [facility]  Lack of Transportation (Medical): No  Lack of Transportation (Non-Medical): No  Physical Activity: Insufficiently Active (06/16/2023)  Received from [facility]  Exercise Vital Sign  Days of Exercise per Week: 1 day  Minutes of Exercise per Session: 30 min  Stress: No Stress Concern Present (06/16/2023)  Received from [facility]  [employer] of Occupational Health - Occupational Stress Questionnaire  Feeling of Stress : Not at all  Social Connections: Moderately Isolated (06/16/2023)  Received from [facility]  Social Connection and Isolation Panel [NHANES]  Frequency of Communication with Friends and Family: More than three times a week   Frequency of Social Gatherings with Friends and Family: Once a week  Attends Religious Services: Never  Database administrator or Organizations: No  Attends Banker Meetings: Never  Marital Status: Married   Objective:   Vitals:  07/02/23 1529  BP: (!) 140/86  Pulse: 75  Temp: 36.7 C (98 F)  SpO2: (!) 87%  Weight: 92.8 kg (204 lb 9.6 oz)  Height: 180.3 cm (5\' 11" )  PainSc: 0-No pain   Body mass index is 28.54 kg/m.  Gen: A&Ox3, no distress  Unlabored respirations Diminished soft, nontender, nondistended. Moderate left inguinal hernia. No hernia on the right.  Assessment and Plan:  Diagnoses and all orders for this visit:  Non-recurrent unilateral inguinal hernia without obstruction or gangrene   We discussed the relevant anatomy and we discussed options for repair. I recommend an open approach and went over the technique of the procedure. Discussed risks of bleeding, infection, pain, scarring, injury to structures in the area including nerves, blood vessels, bowel, bladder, risk of chronic pain, hernia recurrence, risk of seroma or hematoma, urinary retention, and risks of general anesthesia including cardiovascular, pulmonary, and thromboembolic complications. We discussed typical postop recovery, timeline, and activity limitations. We also discussed the option of ongoing observation, with high rate of ultimately returning for surgery and risk of increasing size/symptoms from the hernia as well as incarceration/strangulation and went over symptoms that should prompt him to seek emergency treatment. Questions were come and answered to the patient's satisfaction. Patient wishes to proceed with scheduling, but would like to wait until his wife is recovered somewhat so that she can be more independent and I think that that is completely reasonable. We will tentatively plan to schedule surgery in early March.   Amanii Snethen Carlye Grippe, MD

## 2023-08-08 ENCOUNTER — Other Ambulatory Visit (HOSPITAL_COMMUNITY): Payer: Medicare Other

## 2023-08-11 ENCOUNTER — Other Ambulatory Visit: Payer: Self-pay

## 2023-08-11 ENCOUNTER — Encounter (HOSPITAL_COMMUNITY)
Admission: RE | Admit: 2023-08-11 | Discharge: 2023-08-11 | Disposition: A | Discharge status: Home / Self Care | Payer: Medicare Other | Source: Ambulatory Visit | Attending: Surgery | Admitting: Surgery

## 2023-08-11 ENCOUNTER — Encounter: Payer: Self-pay | Admitting: Internal Medicine

## 2023-08-11 ENCOUNTER — Encounter (HOSPITAL_COMMUNITY): Payer: Self-pay

## 2023-08-11 VITALS — BP 103/66 | HR 71 | Temp 97.9°F | Resp 16 | Ht 70.0 in

## 2023-08-11 DIAGNOSIS — I10 Essential (primary) hypertension: Secondary | ICD-10-CM | POA: Insufficient documentation

## 2023-08-11 DIAGNOSIS — Z01812 Encounter for preprocedural laboratory examination: Secondary | ICD-10-CM | POA: Insufficient documentation

## 2023-08-11 DIAGNOSIS — K4090 Unilateral inguinal hernia, without obstruction or gangrene, not specified as recurrent: Secondary | ICD-10-CM | POA: Insufficient documentation

## 2023-08-11 DIAGNOSIS — I480 Paroxysmal atrial fibrillation: Secondary | ICD-10-CM | POA: Diagnosis not present

## 2023-08-11 DIAGNOSIS — Z01818 Encounter for other preprocedural examination: Secondary | ICD-10-CM

## 2023-08-11 LAB — CBC
HCT: 41.7 % (ref 39.0–52.0)
Hemoglobin: 13.5 g/dL (ref 13.0–17.0)
MCH: 31.2 pg (ref 26.0–34.0)
MCHC: 32.4 g/dL (ref 30.0–36.0)
MCV: 96.3 fL (ref 80.0–100.0)
Platelets: 210 10*3/uL (ref 150–400)
RBC: 4.33 MIL/uL (ref 4.22–5.81)
RDW: 13 % (ref 11.5–15.5)
WBC: 7.4 10*3/uL (ref 4.0–10.5)
nRBC: 0 % (ref 0.0–0.2)

## 2023-08-11 LAB — COMPREHENSIVE METABOLIC PANEL
ALT: 11 U/L (ref 0–44)
AST: 22 U/L (ref 15–41)
Albumin: 3.8 g/dL (ref 3.5–5.0)
Alkaline Phosphatase: 39 U/L (ref 38–126)
Anion gap: 10 (ref 5–15)
BUN: 19 mg/dL (ref 8–23)
CO2: 24 mmol/L (ref 22–32)
Calcium: 8.8 mg/dL — ABNORMAL LOW (ref 8.9–10.3)
Chloride: 105 mmol/L (ref 98–111)
Creatinine, Ser: 0.81 mg/dL (ref 0.61–1.24)
GFR, Estimated: >60 mL/min (ref >60)
Glucose, Bld: 114 mg/dL — ABNORMAL HIGH (ref 70–99)
Potassium: 4.5 mmol/L (ref 3.5–5.1)
Sodium: 139 mmol/L (ref 135–145)
Total Bilirubin: 1.1 mg/dL (ref 0.0–1.2)
Total Protein: 6.6 g/dL (ref 6.5–8.1)

## 2023-08-12 NOTE — Progress Notes (Signed)
Anesthesia Chart Review   Case: [id number] Date/Time: 08/15/23 0915   Procedure: OPEN LEFT HERNIA REPAIR INGUINAL ADULT WITH MESH (Left)   Anesthesia type: General   Pre-op diagnosis: LEFT INGUINAL HERNIA   Location: [facility] ROOM 04 / [facility]   Surgeons: Berna Bue, MD       DISCUSSION:78 y.o. never smoker with h/o HTN, AF, left inguinal hernia scheduled for above procedure 08/15/2023 with Dr. Phylliss Blakes.   Per cardiology preoperative evaluation 07/25/2023, "Preoperative cardiovascular risk assessment.  Hernia repair by Dr. Doylene Canard.   Chart reviewed as part of pre-operative protocol coverage. According to the RCRI, patient has a 0.4% risk of MACE. Patient reports activity equivalent to 4.0 METS (light to moderate household chores).    Given past medical history and time since last visit, based on ACC/AHA guidelines, Rodney Sawyer would be at acceptable risk for the planned procedure without further cardiovascular testing. "  Pt advised to hold Eliquis 48 hours prior to procedure.   VS: BP 103/66   Pulse 71   Temp 36.6 C (Oral)   Resp 16   Ht 5\' 10"  (1.778 m)   SpO2 97%   BMI 29.90 kg/m   PROVIDERS: Wanda Plump, MD is PCP   Cardiologist : Charlton Haws, MD  LABS: Labs reviewed: Acceptable for surgery. (all labs ordered are listed, but only abnormal results are displayed)  Labs Reviewed  COMPREHENSIVE METABOLIC PANEL - Abnormal; Notable for the following components:      Result Value   Glucose, Bld 114 (*)    Calcium 8.8 (*)    All other components within normal limits  CBC     IMAGES:   EKG:   CV: Echo 09/17/2022 1. Left ventricular ejection fraction, by estimation, is 60 to 65%. Left  ventricular ejection fraction by 3D volume is 68 %. The left ventricle has  normal function. The left ventricle has no regional wall motion  abnormalities. Left ventricular diastolic   parameters were normal.   2. Right ventricular systolic function is normal. The right  ventricular  size is normal. There is normal pulmonary artery systolic pressure. The  estimated right ventricular systolic pressure is 26.5 mmHg.   3. Left atrial size was mildly dilated.   4. The mitral valve is normal in structure. No evidence of mitral valve  regurgitation. No evidence of mitral stenosis.   5. The aortic valve is tricuspid. There is mild calcification of the  aortic valve. Aortic valve regurgitation is not visualized. Aortic valve  sclerosis/calcification is present, without any evidence of aortic  stenosis.   6. There is borderline dilatation of the ascending aorta, measuring 40  mm.   7. The inferior vena cava is dilated in size with >50% respiratory  variability, suggesting right atrial pressure of 8 mmHg.   Comparison(s): Prior images unable to be directly viewed, comparison made  by report only.  Past Medical History:  Diagnosis Date   AF (paroxysmal atrial fibrillation) (HCC)    dx ~ 2006   Depression    Gait difficulty    Hepatitis A 1960   containmented water at school when he was a child   Hyperlipidemia    Hypertension    Seborrheic keratosis 12/2015   R midline upper back   Shingles 12/2013   Skin cancer    Basal cell    Past Surgical History:  Procedure Laterality Date   ANTERIOR CERVICAL DECOMP/DISCECTOMY FUSION N/A 03/13/2013   Procedure: ANTERIOR CERVICAL DECOMPRESSION/DISCECTOMY FUSION.  Cervical four-five, Cervical five-six;  Surgeon: Clydene Fake, MD;  [facility]: [facility] NEURO ORS;  Service: Neurosurgery;  Laterality: N/A;   COLONOSCOPY     COLONOSCOPY W/ POLYPECTOMY  2001   negative 2006;[facility]   CYSTOSCOPY  2004   Negative; done for microscopic hematuria, asymptomatic   HERNIA REPAIR     INSERTION OF MESH N/A 10/10/2020   Procedure: INSERTION OF MESH;  Surgeon: Axel Filler, MD;  Location: [facility] OR;  Service: General;  Laterality: N/A;   POLYPECTOMY     SPINE SURGERY     TONSILLECTOMY AND ADENOIDECTOMY     VASECTOMY     XI  ROBOTIC ASSISTED VENTRAL HERNIA N/A 10/10/2020   Procedure: XI ROBOTIC ASSISTED VENTRAL HERNIA REPAIR WITH MESH;  Surgeon: Axel Filler, MD;  Location: [facility] OR;  Service: General;  Laterality: N/A;    MEDICATIONS:  benazepril (LOTENSIN) 20 MG tablet   calcium carbonate (TUMS - DOSED IN MG ELEMENTAL CALCIUM) 500 MG chewable tablet   ciclopirox (PENLAC) 8 % solution   citalopram (CELEXA) 20 MG tablet   clobetasol (TEMOVATE) 0.05 % external solution   diltiazem (DILT-XR) 180 MG 24 hr capsule   Docusate Calcium (STOOL SOFTENER PO)   ELIQUIS 5 MG TABS tablet   flecainide (TAMBOCOR) 100 MG tablet   Investigational - Study Medication   Omega-3 Fatty Acids (FISH OIL PO)   omeprazole (PRILOSEC) 20 MG capsule   polyethylene glycol (MIRALAX / GLYCOLAX) 17 g packet   rosuvastatin (CRESTOR) 40 MG tablet   No current facility-administered medications for this encounter.    Jodell Cipro Ward, PA-C [facility] Pre-Surgical Testing [phone]

## 2023-08-14 NOTE — Anesthesia Preprocedure Evaluation (Addendum)
Anesthesia Evaluation    Reviewed: Allergy & Precautions, Patient's Chart, lab work & pertinent test results  Airway Mallampati: III  TM Distance: >3 FB Neck ROM: Full    Dental  (+) Teeth Intact, Dental Advisory Given   Pulmonary  Mild OSA on sleep study, no cpap recommended  Still snores   Pulmonary exam normal breath sounds clear to auscultation       Cardiovascular hypertension, Pt. on medications Normal cardiovascular exam+ dysrhythmias (eliquis LD 3d ago) Atrial Fibrillation  Rhythm:Regular Rate:Normal  Echo 2024  1. Left ventricular ejection fraction, by estimation, is 60 to 65%. Left  ventricular ejection fraction by 3D volume is 68 %. The left ventricle has  normal function. The left ventricle has no regional wall motion  abnormalities. Left ventricular diastolic   parameters were normal.   2. Right ventricular systolic function is normal. The right ventricular  size is normal. There is normal pulmonary artery systolic pressure. The  estimated right ventricular systolic pressure is 26.5 mmHg.   3. Left atrial size was mildly dilated.   4. The mitral valve is normal in structure. No evidence of mitral valve  regurgitation. No evidence of mitral stenosis.   5. The aortic valve is tricuspid. There is mild calcification of the  aortic valve. Aortic valve regurgitation is not visualized. Aortic valve  sclerosis/calcification is present, without any evidence of aortic  stenosis.   6. There is borderline dilatation of the ascending aorta, measuring 40  mm.   7. The inferior vena cava is dilated in size with >50% respiratory  variability, suggesting right atrial pressure of 8 mmHg.      Neuro/Psych  PSYCHIATRIC DISORDERS Anxiety Depression    negative neurological ROS     GI/Hepatic Neg liver ROS,GERD  Medicated and Controlled,,  Endo/Other  negative endocrine ROS    Renal/GU negative Renal ROS  negative  genitourinary   Musculoskeletal negative musculoskeletal ROS (+)    Abdominal   Peds  Hematology negative hematology ROS (+)   Anesthesia Other Findings   Reproductive/Obstetrics negative OB ROS                             Anesthesia Physical Anesthesia Plan  ASA: 3  Anesthesia Plan: General   Post-op Pain Management: Tylenol PO (pre-op)*   Induction: Intravenous  PONV Risk Score and Plan: 3 and Ondansetron, Dexamethasone and Treatment may vary due to age or medical condition  Airway Management Planned: Oral ETT  Additional Equipment: None  Intra-op Plan:   Post-operative Plan: Extubation in OR  Informed Consent: I have reviewed the patients History and Physical, chart, labs and discussed the procedure including the risks, benefits and alternatives for the proposed anesthesia with the patient or authorized representative who has indicated his/her understanding and acceptance.     Dental advisory given  Plan Discussed with: CRNA  Anesthesia Plan Comments:         Anesthesia Quick Evaluation

## 2023-08-15 ENCOUNTER — Other Ambulatory Visit: Payer: Self-pay

## 2023-08-15 ENCOUNTER — Ambulatory Visit (HOSPITAL_COMMUNITY)
Admission: RE | Admit: 2023-08-15 | Discharge: 2023-08-15 | Disposition: A | Discharge status: Home / Self Care | Payer: Medicare Other | Attending: Surgery | Admitting: Surgery

## 2023-08-15 ENCOUNTER — Ambulatory Visit (HOSPITAL_COMMUNITY): Payer: Medicare Other | Admitting: Physician Assistant

## 2023-08-15 ENCOUNTER — Ambulatory Visit (HOSPITAL_BASED_OUTPATIENT_CLINIC_OR_DEPARTMENT_OTHER): Payer: Medicare Other | Admitting: Anesthesiology

## 2023-08-15 ENCOUNTER — Encounter (HOSPITAL_COMMUNITY)
Admission: RE | Disposition: A | Discharge status: Home / Self Care | Payer: Self-pay | Source: Home / Self Care | Attending: Surgery

## 2023-08-15 ENCOUNTER — Encounter (HOSPITAL_COMMUNITY): Payer: Self-pay | Admitting: Surgery

## 2023-08-15 DIAGNOSIS — Z9889 Other specified postprocedural states: Secondary | ICD-10-CM | POA: Diagnosis not present

## 2023-08-15 DIAGNOSIS — F419 Anxiety disorder, unspecified: Secondary | ICD-10-CM | POA: Insufficient documentation

## 2023-08-15 DIAGNOSIS — Z7901 Long term (current) use of anticoagulants: Secondary | ICD-10-CM | POA: Diagnosis not present

## 2023-08-15 DIAGNOSIS — I4891 Unspecified atrial fibrillation: Secondary | ICD-10-CM | POA: Diagnosis not present

## 2023-08-15 DIAGNOSIS — F32A Depression, unspecified: Secondary | ICD-10-CM | POA: Insufficient documentation

## 2023-08-15 DIAGNOSIS — K4090 Unilateral inguinal hernia, without obstruction or gangrene, not specified as recurrent: Secondary | ICD-10-CM | POA: Diagnosis not present

## 2023-08-15 DIAGNOSIS — D176 Benign lipomatous neoplasm of spermatic cord: Secondary | ICD-10-CM | POA: Diagnosis not present

## 2023-08-15 DIAGNOSIS — G4733 Obstructive sleep apnea (adult) (pediatric): Secondary | ICD-10-CM | POA: Diagnosis not present

## 2023-08-15 DIAGNOSIS — I10 Essential (primary) hypertension: Secondary | ICD-10-CM

## 2023-08-15 DIAGNOSIS — E785 Hyperlipidemia, unspecified: Secondary | ICD-10-CM | POA: Insufficient documentation

## 2023-08-15 DIAGNOSIS — I480 Paroxysmal atrial fibrillation: Secondary | ICD-10-CM | POA: Diagnosis not present

## 2023-08-15 DIAGNOSIS — Z01818 Encounter for other preprocedural examination: Secondary | ICD-10-CM

## 2023-08-15 HISTORY — PX: INGUINAL HERNIA REPAIR: SHX194

## 2023-08-15 SURGERY — REPAIR, HERNIA, INGUINAL, ADULT
Anesthesia: General | Laterality: Left

## 2023-08-15 MED ORDER — LACTATED RINGERS IV SOLN: INTRAVENOUS | Status: DC

## 2023-08-15 MED ORDER — PHENYLEPHRINE 80 MCG/ML (10ML) SYRINGE FOR IV PUSH (FOR BLOOD PRESSURE SUPPORT)
PREFILLED_SYRINGE | INTRAVENOUS | Status: DC | PRN
  Administered 2023-08-15: 80 ug via INTRAVENOUS

## 2023-08-15 MED ORDER — ACETAMINOPHEN 500 MG PO TABS
1000.0000 mg | ORAL_TABLET | Freq: Once | ORAL | Status: AC
  Administered 2023-08-15: 1000 mg via ORAL
  Filled 2023-08-15: qty 2

## 2023-08-15 MED ORDER — ONDANSETRON HCL 4 MG/2ML IJ SOLN: 4.0000 mg | Freq: Once | INTRAMUSCULAR | Status: DC | PRN

## 2023-08-15 MED ORDER — ORAL CARE MOUTH RINSE: 15.0000 mL | Freq: Once | OROMUCOSAL | Status: AC

## 2023-08-15 MED ORDER — BUPIVACAINE-EPINEPHRINE 0.25% -1:200000 IJ SOLN
INTRAMUSCULAR | Status: AC
  Filled 2023-08-15: qty 1

## 2023-08-15 MED ORDER — ACETAMINOPHEN 500 MG PO TABS: 1000.0000 mg | ORAL_TABLET | ORAL | Status: DC

## 2023-08-15 MED ORDER — OXYCODONE HCL 5 MG PO TABS: 5.0000 mg | ORAL_TABLET | Freq: Once | ORAL | Status: DC | PRN

## 2023-08-15 MED ORDER — BUPIVACAINE LIPOSOME 1.3 % IJ SUSP
INTRAMUSCULAR | Status: DC | PRN
  Administered 2023-08-15: 20 mL

## 2023-08-15 MED ORDER — EPHEDRINE SULFATE-NACL 50-0.9 MG/10ML-% IV SOSY
PREFILLED_SYRINGE | INTRAVENOUS | Status: DC | PRN
  Administered 2023-08-15 (×2): 5 mg via INTRAVENOUS
  Administered 2023-08-15: 10 mg via INTRAVENOUS
  Administered 2023-08-15 (×3): 5 mg via INTRAVENOUS

## 2023-08-15 MED ORDER — EPHEDRINE 5 MG/ML INJ
INTRAVENOUS | Status: AC
  Filled 2023-08-15: qty 5

## 2023-08-15 MED ORDER — MIDAZOLAM HCL 2 MG/2ML IJ SOLN
INTRAMUSCULAR | Status: AC
  Filled 2023-08-15: qty 2

## 2023-08-15 MED ORDER — ONDANSETRON HCL 4 MG/2ML IJ SOLN
INTRAMUSCULAR | Status: AC
  Filled 2023-08-15: qty 2

## 2023-08-15 MED ORDER — CHLORHEXIDINE GLUCONATE 0.12 % MT SOLN
15.0000 mL | Freq: Once | OROMUCOSAL | Status: AC
  Administered 2023-08-15: 15 mL via OROMUCOSAL

## 2023-08-15 MED ORDER — BUPIVACAINE-EPINEPHRINE 0.25% -1:200000 IJ SOLN
INTRAMUSCULAR | Status: DC | PRN
  Administered 2023-08-15: 30 mL

## 2023-08-15 MED ORDER — SUGAMMADEX SODIUM 200 MG/2ML IV SOLN
INTRAVENOUS | Status: DC | PRN
  Administered 2023-08-15: 200 mg via INTRAVENOUS

## 2023-08-15 MED ORDER — ROCURONIUM BROMIDE 100 MG/10ML IV SOLN
INTRAVENOUS | Status: DC | PRN
  Administered 2023-08-15: 50 mg via INTRAVENOUS
  Administered 2023-08-15: 20 mg via INTRAVENOUS

## 2023-08-15 MED ORDER — DEXAMETHASONE SODIUM PHOSPHATE 10 MG/ML IJ SOLN
INTRAMUSCULAR | Status: AC
  Filled 2023-08-15: qty 1

## 2023-08-15 MED ORDER — AMISULPRIDE (ANTIEMETIC) 5 MG/2ML IV SOLN: 10.0000 mg | Freq: Once | INTRAVENOUS | Status: DC | PRN

## 2023-08-15 MED ORDER — OXYCODONE HCL 5 MG PO TABS: 5.0000 mg | ORAL_TABLET | Freq: Three times a day (TID) | ORAL | 0 refills | Status: AC | PRN

## 2023-08-15 MED ORDER — LIDOCAINE HCL (PF) 2 % IJ SOLN
INTRAMUSCULAR | Status: AC
  Filled 2023-08-15: qty 5

## 2023-08-15 MED ORDER — FENTANYL CITRATE (PF) 100 MCG/2ML IJ SOLN
INTRAMUSCULAR | Status: DC | PRN
  Administered 2023-08-15: 50 ug via INTRAVENOUS

## 2023-08-15 MED ORDER — CEFAZOLIN SODIUM-DEXTROSE 2-4 GM/100ML-% IV SOLN
2.0000 g | INTRAVENOUS | Status: AC
  Administered 2023-08-15: 2 g via INTRAVENOUS
  Filled 2023-08-15: qty 100

## 2023-08-15 MED ORDER — DOCUSATE SODIUM 100 MG PO CAPS: 100.0000 mg | ORAL_CAPSULE | Freq: Two times a day (BID) | ORAL | 0 refills | Status: AC

## 2023-08-15 MED ORDER — MIDAZOLAM HCL 5 MG/5ML IJ SOLN
INTRAMUSCULAR | Status: DC | PRN
  Administered 2023-08-15: 1 mg via INTRAVENOUS

## 2023-08-15 MED ORDER — HYDROMORPHONE HCL 1 MG/ML IJ SOLN: 0.2500 mg | INTRAMUSCULAR | Status: DC | PRN

## 2023-08-15 MED ORDER — ONDANSETRON HCL 4 MG/2ML IJ SOLN
INTRAMUSCULAR | Status: DC | PRN
  Administered 2023-08-15: 4 mg via INTRAVENOUS

## 2023-08-15 MED ORDER — GABAPENTIN 300 MG PO CAPS
300.0000 mg | ORAL_CAPSULE | ORAL | Status: AC
  Administered 2023-08-15: 300 mg via ORAL
  Filled 2023-08-15: qty 1

## 2023-08-15 MED ORDER — PROPOFOL 10 MG/ML IV BOLUS
INTRAVENOUS | Status: AC
  Filled 2023-08-15: qty 20

## 2023-08-15 MED ORDER — BUPIVACAINE LIPOSOME 1.3 % IJ SUSP
INTRAMUSCULAR | Status: AC
  Filled 2023-08-15: qty 20

## 2023-08-15 MED ORDER — CHLORHEXIDINE GLUCONATE 4 % EX SOLN: 60.0000 mL | Freq: Once | CUTANEOUS | Status: DC

## 2023-08-15 MED ORDER — BUPIVACAINE LIPOSOME 1.3 % IJ SUSP: 20.0000 mL | Freq: Once | INTRAMUSCULAR | Status: DC

## 2023-08-15 MED ORDER — PROPOFOL 10 MG/ML IV BOLUS
INTRAVENOUS | Status: DC | PRN
  Administered 2023-08-15: 150 mg via INTRAVENOUS

## 2023-08-15 MED ORDER — PHENYLEPHRINE 80 MCG/ML (10ML) SYRINGE FOR IV PUSH (FOR BLOOD PRESSURE SUPPORT)
PREFILLED_SYRINGE | INTRAVENOUS | Status: AC
  Filled 2023-08-15: qty 10

## 2023-08-15 MED ORDER — FENTANYL CITRATE (PF) 100 MCG/2ML IJ SOLN
INTRAMUSCULAR | Status: AC
  Filled 2023-08-15: qty 2

## 2023-08-15 MED ORDER — OXYCODONE HCL 5 MG/5ML PO SOLN: 5.0000 mg | Freq: Once | ORAL | Status: DC | PRN

## 2023-08-15 MED ORDER — DEXAMETHASONE SODIUM PHOSPHATE 4 MG/ML IJ SOLN
INTRAMUSCULAR | Status: DC | PRN
  Administered 2023-08-15: 6 mg via INTRAVENOUS

## 2023-08-15 MED ORDER — LIDOCAINE HCL (CARDIAC) PF 100 MG/5ML IV SOSY
PREFILLED_SYRINGE | INTRAVENOUS | Status: DC | PRN
  Administered 2023-08-15: 60 mg via INTRAVENOUS

## 2023-08-15 SURGICAL SUPPLY — 28 items
BAG COUNTER SPONGE SURGICOUNT (BAG) IMPLANT
BENZOIN TINCTURE PRP APPL 2/3 (GAUZE/BANDAGES/DRESSINGS) ×1 IMPLANT
BLADE SURG 15 STRL LF DISP TIS (BLADE) ×1 IMPLANT
CHLORAPREP W/TINT 26 (MISCELLANEOUS) ×1 IMPLANT
COVER SURGICAL LIGHT HANDLE (MISCELLANEOUS) ×1 IMPLANT
DRAIN PENROSE 0.5X18 (DRAIN) ×1 IMPLANT
DRAPE LAPAROSCOPIC ABDOMINAL (DRAPES) ×1 IMPLANT
ELECT REM PT RETURN 15FT ADLT (MISCELLANEOUS) ×1 IMPLANT
GAUZE SPONGE 4X4 12PLY STRL (GAUZE/BANDAGES/DRESSINGS) IMPLANT
GLOVE BIO SURGEON STRL SZ 6 (GLOVE) ×1 IMPLANT
GLOVE INDICATOR 6.5 STRL GRN (GLOVE) ×1 IMPLANT
GOWN STRL REUS W/ TWL LRG LVL3 (GOWN DISPOSABLE) ×1 IMPLANT
KIT BASIN OR (CUSTOM PROCEDURE TRAY) ×1 IMPLANT
KIT TURNOVER KIT A (KITS) IMPLANT
MARKER SKIN DUAL TIP RULER LAB (MISCELLANEOUS) ×1 IMPLANT
MESH ULTRAPRO 3X6 7.6X15CM (Mesh General) IMPLANT
NDL HYPO 22X1.5 SAFETY MO (MISCELLANEOUS) ×1 IMPLANT
NEEDLE HYPO 22X1.5 SAFETY MO (MISCELLANEOUS) ×1
PACK GENERAL/GYN (CUSTOM PROCEDURE TRAY) ×1 IMPLANT
SPIKE FLUID TRANSFER (MISCELLANEOUS) ×1 IMPLANT
STRIP CLOSURE SKIN 1/2X4 (GAUZE/BANDAGES/DRESSINGS) ×1 IMPLANT
SUT ETHIBOND 0 MO6 C/R (SUTURE) ×1 IMPLANT
SUT MNCRL AB 4-0 PS2 18 (SUTURE) ×1 IMPLANT
SUT PDS AB 0 CT1 36 (SUTURE) ×2 IMPLANT
SUT VIC AB 3-0 SH 27XBRD (SUTURE) ×2 IMPLANT
SUT VICRYL 3 0 BR 18 UND (SUTURE) ×1 IMPLANT
SYR CONTROL 10ML LL (SYRINGE) ×1 IMPLANT
TOWEL OR 17X26 10 PK STRL BLUE (TOWEL DISPOSABLE) ×1 IMPLANT

## 2023-08-15 NOTE — Transfer of Care (Signed)
Immediate Anesthesia Transfer of Care Note  Patient: Rodney Sawyer  Procedure(s) Performed: OPEN LEFT HERNIA REPAIR INGUINAL ADULT WITH MESH (Left)  Patient Location: PACU  Anesthesia Type:General  Level of Consciousness: drowsy and patient cooperative  Airway & Oxygen Therapy: Patient Spontanous Breathing and Patient connected to face mask oxygen  Post-op Assessment: Report given to RN and Post -op Vital signs reviewed and stable  Post vital signs: Reviewed and stable  Last Vitals:  Vitals Value Taken Time  BP 109/57 08/15/23 1100  Temp    Pulse 61 08/15/23 1101  Resp 16 08/15/23 1101  SpO2 100 % 08/15/23 1101  Vitals shown include unfiled device data.  Last Pain:  Vitals:   08/15/23 0758  TempSrc: Oral  PainSc:          Complications: No notable events documented.

## 2023-08-15 NOTE — Anesthesia Procedure Notes (Signed)
Procedure Name: Intubation Date/Time: 08/15/2023 9:28 AM  Performed by: Dennison Nancy, CRNAPre-anesthesia Checklist: Patient identified, Emergency Drugs available, Suction available, Patient being monitored and Timeout performed Patient Re-evaluated:Patient Re-evaluated prior to induction Oxygen Delivery Method: Circle system utilized Preoxygenation: Pre-oxygenation with 100% oxygen Induction Type: IV induction Ventilation: Mask ventilation without difficulty Laryngoscope Size: Mac and 4 Grade View: Grade II Tube type: Oral Tube size: 8.0 mm Number of attempts: 1 Airway Equipment and Method: Stylet Placement Confirmation: ETT inserted through vocal cords under direct vision, positive ETCO2, CO2 detector and breath sounds checked- equal and bilateral Secured at: 24 (24 cm at lip; secured with pink hy-tape) cm Tube secured with: Tape Dental Injury: Teeth and Oropharynx as per pre-operative assessment

## 2023-08-15 NOTE — Interval H&P Note (Signed)
History and Physical Interval Note:  08/15/2023 8:37 AM  Rodney Sawyer  has presented today for surgery, with the diagnosis of LEFT INGUINAL HERNIA.  The various methods of treatment have been discussed with the patient and family. After consideration of risks, benefits and other options for treatment, the patient has consented to  Procedure(s): OPEN LEFT HERNIA REPAIR INGUINAL ADULT WITH MESH (Left) as a surgical intervention.  The patient's history has been reviewed, patient examined, no change in status, stable for surgery.  I have reviewed the patient's chart and labs.  Questions were answered to the patient's satisfaction.     Prabhnoor Ellenberger Lollie Sails

## 2023-08-15 NOTE — Discharge Instructions (Signed)
HERNIA REPAIR: POST OP INSTRUCTIONS   EAT Gradually transition to a high fiber diet with a fiber supplement over the next few weeks after discharge.  Start with a pureed / full liquid diet (see below)  WALK Walk an hour a day (cumulative- not all at once).  Control your pain to do that.    CONTROL PAIN Control pain so that you can walk, sleep, tolerate sneezing/coughing, and go up/down stairs.  HAVE A BOWEL MOVEMENT DAILY Keep your bowels regular to avoid problems.  OK to try a laxative to override constipation.  OK to use an antidiarrheal to slow down diarrhea.  Call if not better after 2 tries  CALL IF YOU HAVE PROBLEMS/CONCERNS Call if you are still struggling despite following these instructions. Call if you have concerns not answered by these instructions  ######################################################################    DIET: Follow a light bland diet & liquids the first 24 hours after arrival home, such as soup, liquids, starches, etc.  Be sure to drink plenty of fluids.  Quickly advance to a usual solid diet within a few days.  Avoid fast food or heavy meals initially as you are more likely to get nauseated or have irregular bowels.  Take your usually prescribed home medications unless otherwise directed.  PAIN CONTROL: Pain is best controlled by a usual combination of three different methods TOGETHER: Ice/Heat Over the counter pain medication Prescription pain medication Most patients will experience some swelling and bruising around the hernia(s) such as the bellybutton, groins, or old incisions.  Ice packs or heating pads (30-60 minutes up to 6 times a day) will help. Use ice for the first few days to help decrease swelling and bruising, then switch to heat to help relax tight/sore spots and speed recovery.  Some people prefer to use ice alone, heat alone, alternating between ice & heat.  Experiment to what works for you.  Swelling and bruising can take several  weeks to resolve.   It is helpful to take an over-the-counter pain medication regularly for the first days: Naproxen (Aleve, etc)  Two 220mg  tabs twice a day OR Ibuprofen (Advil, etc) Three 200mg  tabs four times a day (every meal & bedtime) AND Acetaminophen (Tylenol, etc) 325-650mg  four times a day (every meal & bedtime) A  prescription for pain medication should be given to you upon discharge.  Take your pain medication as prescribed, IF NEEDED.  If you are having problems/concerns with the prescription medicine (does not control pain, nausea, vomiting, rash, itching, etc), please call us [phone] to see if we need to switch you to a different pain medicine that will work better for you and/or control your side effect better. If you need a refill on your pain medication, please contact your pharmacy.  They will contact our office to request authorization. Prescriptions will not be filled after 5 pm or on week-ends.  Avoid getting constipated.  Between the surgery and the pain medications, it is common to experience some constipation.  Increasing fluid intake and taking a fiber supplement (such as Metamucil, Citrucel, FiberCon, MiraLax, etc) 1-2 times a day regularly will usually help prevent this problem from occurring.  A mild laxative (prune juice, Milk of Magnesia, MiraLax, etc) should be taken according to package directions if there are no bowel movements after 48 hours.    Wash / shower every day, starting 2 days after surgery.  You may shower over the steri strips which are waterproof.  No rubbing, scrubbing, lotions or ointments to  incision(s). Do not soak or submerge.   Remove your outer bandage 2 days after surgery. Steri strips (small white tapes) will peel off after 1-2 weeks. You may leave the incision open to air.  You may replace a dressing/Band-Aid to cover an incision for comfort if you wish.  Continue to shower over incision(s) after the dressing is off.  ACTIVITIES as  tolerated:   You may resume regular (light) daily activities beginning the next day--such as daily self-care, walking, climbing stairs--gradually increasing activities as tolerated.  Control your pain so that you can walk an hour a day.  If you can walk 30 minutes without difficulty, it is safe to try more intense activity such as jogging, treadmill, bicycling, low-impact aerobics, swimming, etc. Refrain from the most intensive and strenuous activity such as sit-ups, heavy lifting, contact sports, etc  Refrain from any heavy lifting or straining until 6 weeks after surgery.   DO NOT PUSH THROUGH PAIN.  Let pain be your guide: If it hurts to do something, don't do it.  Pain is your body warning you to avoid that activity for another week until the pain goes down. You may drive when you are no longer taking prescription pain medication, you can comfortably wear a seatbelt, and you can safely maneuver your car and apply brakes. You may have sexual intercourse when it is comfortable.   FOLLOW UP in our office Please call [facility] at [phone] to set up an appointment to see your surgeon in the office for a follow-up appointment approximately 2-3 weeks after your surgery. Make sure that you call for this appointment the day you arrive home to insure a convenient appointment time.  9.  If you have disability of FMLA / Family leave forms, please bring the forms to the office for processing.  (do not give to your surgeon).  WHEN TO CALL us [phone]: Poor pain control Reactions / problems with new medications (rash/itching, nausea, etc)  Fever over 101.5 F (38.5 C) Inability to urinate Nausea and/or vomiting Worsening swelling or bruising Continued bleeding from incision. Increased pain, redness, or drainage from the incision   The clinic staff is available to answer your questions during regular business hours (8:30am-5pm).  Please don't hesitate to call and ask to speak to one of our nurses for  clinical concerns.   If you have a medical emergency, go to the nearest emergency room or call 911.  A surgeon from [facility] is always on call at the hospitals in [facility], [place] [address], [address], [place], [place]  [address] ?  [address] [address], [place], [place]   [address] MAIN: [phone] ? TOLL FREE: [phone] ? FAX: [phone] [email or web address]

## 2023-08-15 NOTE — Anesthesia Postprocedure Evaluation (Signed)
Anesthesia Post Note  Patient: Rodney Sawyer  Procedure(s) Performed: OPEN LEFT HERNIA REPAIR INGUINAL ADULT WITH MESH (Left)     Patient location during evaluation: PACU Anesthesia Type: General Level of consciousness: awake and alert, oriented and patient cooperative Pain management: pain level controlled Vital Signs Assessment: post-procedure vital signs reviewed and stable Respiratory status: spontaneous breathing, nonlabored ventilation and respiratory function stable Cardiovascular status: blood pressure returned to baseline and stable Postop Assessment: no apparent nausea or vomiting Anesthetic complications: no   No notable events documented.  Last Vitals:  Vitals:   08/15/23 1100 08/15/23 1115  BP: (!) 109/57 (!) 106/59  Pulse: 62 60  Resp: 18 17  Temp:    SpO2: 100% 96%    Last Pain:  Vitals:   08/15/23 1056  TempSrc:   PainSc: 0-No pain                 Lannie Fields

## 2023-08-15 NOTE — Op Note (Signed)
Operative Note  Rodney Sawyer  [id number]  [id number]  08/15/2023   Surgeon: Berna Bue MD FACS   Procedure performed: Open left inguinal hernia repair with mesh   Preop diagnosis:  left inguinal hernia   Post-op diagnosis/intraop findings:  Moderate indirect left inguinal hernia, moderate direct inguinal hernia, essentially complete disruption of the inguinal floor and large cord lipoma   Specimens: none   EBL: 5cc   Complications: none   Description of procedure: After confirming informed consent, the patient was taken to the operating room and placed supine on operating room table where general anesthesia was initiated, preoperative antibiotics were administered, SCDs applied, and a formal timeout was performed. The groin was clipped, prepped and draped in the usual sterile fashion. An oblique incision was made the just above the inguinal ligament after infiltrating the tissues with local anesthetic (Exparel mixed with quarter percent Marcaine with epinephrine). Soft tissues were dissected using electrocautery until the external oblique aponeurosis was encountered. This was divided sharply to expand the external ring. A plane was bluntly developed between the spermatic cord and the external oblique. The ilioinguinal nerve was divided between hemostats and each and ligated with 3-0 Vicryl ties. The spermatic cord was then bluntly dissected away from the pubic tubercle and encircled with a Penrose. Inspection of the inguinal anatomy revealed a moderate indirect hernia sac as well as a direct hernia defect and essentially complete disruption of the inguinal floor.  There was also a large cord lipoma.  The cord lipoma was skeletonized down to the level of the internal ring where it was ligated with a 3-0 Vicryl tie and amputated. The indirect hernia sac was bluntly dissected away from the cord structures and skeletonized to the level of the internal ring, where it was reduced intact into the  abdomen.  The inguinal floor was reconstructed suturing the conjoined tendon to the inguinal ligament with interrupted 0 PDS, leaving an internal ring just sufficient for the cord structures. A 3 x 6 piece of ultra Pro mesh was brought onto the field and trimmed to approximate the field. This was sutured to the pubic tubercle fascia, inferior shelving edge and to the internal oblique superiorly with interrupted 0 ethibonds. The tails of the mesh were wrapped around the spermatic cord, ensuring adequate room for the cord, and sutured to each other with 0 ethibond, and then directed laterally to lie flat beneath the external oblique aponeurosis.  An additional suture was placed medially to reinforce the slit and the mesh.  Hemostasis was ensured within the wound. The Penrose was removed. The external oblique aponeurosis was reapproximated with a running 3-0 Vicryl to re-create a narrowed external ring. More local was infiltrated around the pubic tubercle and in the plane just below the external oblique. The Scarpa's was reapproximated with interrupted 3-0 Vicryls. The skin was closed with a running subcuticular 4-0 Monocryl. The remainder of the local was injected in the subcutaneous and subcuticular space. The field was then cleaned, benzoin and Steri-Strips and sterile bandage were applied. The patient was then awakened extubated and taken to PACU in stable condition.    All counts were correct at the completion of the case

## 2023-08-18 ENCOUNTER — Encounter (HOSPITAL_COMMUNITY): Payer: Self-pay | Admitting: Surgery

## 2023-08-20 ENCOUNTER — Ambulatory Visit (INDEPENDENT_AMBULATORY_CARE_PROVIDER_SITE_OTHER): Payer: Medicare Other | Admitting: Internal Medicine

## 2023-08-20 ENCOUNTER — Encounter: Payer: Self-pay | Admitting: Internal Medicine

## 2023-08-20 VITALS — BP 124/80 | HR 68 | Temp 97.4°F | Resp 18 | Ht 71.0 in | Wt 204.0 lb

## 2023-08-20 DIAGNOSIS — Z0000 Encounter for general adult medical examination without abnormal findings: Secondary | ICD-10-CM

## 2023-08-20 DIAGNOSIS — E785 Hyperlipidemia, unspecified: Secondary | ICD-10-CM | POA: Diagnosis not present

## 2023-08-20 DIAGNOSIS — R739 Hyperglycemia, unspecified: Secondary | ICD-10-CM

## 2023-08-20 NOTE — Patient Instructions (Addendum)
 Proceed with a COVID-vaccine if not done in the last 5 months.   Check the  blood pressure regularly Blood pressure goal:  between 110/65 and  135/85. If it is consistently higher or lower, let me know     GO TO THE LAB : Get the blood work     Next visit with me in 6 months.     Please schedule it at the front desk       Health Care Power of attorney ,  Living will (Advance care planning documents)  If you already have a living will or healthcare power of attorney, is recommended you bring the copy to be scanned in your chart.   The document will be available to all the doctors you see in the system.  Advance care planning is a process that supports adults in  understanding and sharing their preferences regarding future medical care.  The patient's preferences are recorded in documents called Advance Directives and the can be modified at any time while the patient is in full mental capacity.   If you don't have one, please consider create one.      More information at: [email or web address]
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
## 2023-08-21 ENCOUNTER — Encounter: Payer: Self-pay | Admitting: Internal Medicine

## 2023-08-21 LAB — LIPID PANEL
Cholesterol: 127 mg/dL (ref 0–200)
HDL: 44.6 mg/dL (ref >39.00)
LDL Cholesterol: 66 mg/dL (ref 0–99)
NonHDL: 82.46
Total CHOL/HDL Ratio: 3
Triglycerides: 80 mg/dL (ref 0.0–149.0)
VLDL: 16 mg/dL (ref 0.0–40.0)

## 2023-08-21 LAB — HEMOGLOBIN A1C: Hgb A1c MFr Bld: 5.7 % (ref 4.6–6.5)

## 2023-08-21 NOTE — Assessment & Plan Note (Signed)
 Here for CPX. -Td 2020  - PNM 2013;  Prevnar 08-2014; PNM 20: 2023;  s/p RSV, zostavax and shingrix x 2 -Had a flu shot. - Recommend COVID-vaccine if not done recently. -Prostate cancer screening: Aged out for screening.  Work up if symptoms.     -CCS: colonoscopy 05-2014, 3 polyps, cscope again 05-2017, colonoscopy October 2024, next per GI. - Diet: Discussed - Labs reviewed: 08/11/2023: Creatinine 0.8, calcium  8.8 is slightly low.  LFTs normal.  CBC normal. Will check a A1c FLP -Healthcare POA: Information provided

## 2023-08-21 NOTE — Assessment & Plan Note (Signed)
 Here for CPX. All her issues also discussed, Prediabtes: Check A1c HTN: BP today is very good, recommend to check at home, no change. Hyperlipidemia: Rosuvastatin  half tablet 3 times a week, last LFTs normal, check FLP. Depression: Controlled on citalopram . PAF: LOV cardiology 02/20/2023.  No changes made. Skin Ca- sees derm regulalrly RTC 6 months

## 2023-09-02 ENCOUNTER — Other Ambulatory Visit: Payer: Self-pay | Admitting: Internal Medicine

## 2023-10-21 ENCOUNTER — Other Ambulatory Visit: Payer: Self-pay | Admitting: Internal Medicine

## 2023-10-21 ENCOUNTER — Encounter: Payer: Self-pay | Admitting: Internal Medicine

## 2023-10-21 MED ORDER — CITALOPRAM HYDROBROMIDE 40 MG PO TABS: 40.0000 mg | ORAL_TABLET | Freq: Every day | ORAL | 1 refills | Status: DC

## 2023-10-21 NOTE — Addendum Note (Signed)
 Addended byConrad [place] D on: 10/21/2023 04:36 PM   Modules accepted: Orders

## 2023-10-21 NOTE — Telephone Encounter (Signed)
 Rx sent

## 2023-10-21 NOTE — Telephone Encounter (Signed)
 Sent citalopram 40 mg 1 tablet daily , 90-day supply.

## 2023-11-05 ENCOUNTER — Encounter: Payer: Self-pay | Admitting: Internal Medicine

## 2023-11-06 DIAGNOSIS — D045 Carcinoma in situ of skin of trunk: Secondary | ICD-10-CM | POA: Diagnosis not present

## 2023-11-06 DIAGNOSIS — D485 Neoplasm of uncertain behavior of skin: Secondary | ICD-10-CM | POA: Diagnosis not present

## 2023-11-06 DIAGNOSIS — L570 Actinic keratosis: Secondary | ICD-10-CM | POA: Diagnosis not present

## 2023-11-06 DIAGNOSIS — C44722 Squamous cell carcinoma of skin of right lower limb, including hip: Secondary | ICD-10-CM | POA: Diagnosis not present

## 2023-11-06 DIAGNOSIS — B351 Tinea unguium: Secondary | ICD-10-CM | POA: Diagnosis not present

## 2023-11-06 DIAGNOSIS — B360 Pityriasis versicolor: Secondary | ICD-10-CM | POA: Diagnosis not present

## 2023-11-06 DIAGNOSIS — Z85828 Personal history of other malignant neoplasm of skin: Secondary | ICD-10-CM | POA: Diagnosis not present

## 2023-11-18 ENCOUNTER — Ambulatory Visit: Payer: Self-pay

## 2023-11-18 NOTE — Telephone Encounter (Signed)
 Chief Complaint: Low Blood Pressure Symptoms: Intermittent lightheadedness Frequency: This AM Pertinent Negatives: Patient denies fever, fainting Disposition: [] ED /[] Urgent Care (no appt availability in office) / [x] Appointment(In office/virtual)/ []  [facility]/ [] Home Care/ [] Refused Recommended Disposition /[] [facility]/ []  Follow-up with PCP Additional Notes: Pt's spouse calling to report pt's BP reading as advised by PCP. Pt's spouse notes pt experienced a short episode of lightheadedness this AM. Pt asymptomatic at this time. OV scheduled. This RN educated pt on home care, new-worsening symptoms, when to call back/seek emergent care. Pt verbalized understanding and agrees to plan.    Copied from CRM [id number]. Topic: Clinical - Pink Word Triage >> Nov 18, 2023 12:49 PM Kita Perish H wrote: Reason for Triage: Patients wife is calling to report low blood pressure 103/60 and 90 something over something Reason for Disposition  [1] Systolic BP 90-110 AND [2] taking blood pressure medications AND [3] NOT dizzy, lightheaded or weak  Answer Assessment - Initial Assessment Questions 1. BLOOD PRESSURE: "What is the blood pressure?" "Did you take at least two measurements 5 minutes apart?"     103/60 2. ONSET: "When did you take your blood pressure?"     This AM 3. HOW: "How did you obtain the blood pressure?" (e.g., visiting nurse, automatic home BP monitor)     Home Automatic Wrist Cuff 4. HISTORY: "Do you have a history of low blood pressure?" "What is your blood pressure normally?"     No  7. OTHER SYMPTOMS: "Have you been sick recently?" "Have you had a recent injury?"     Allergies currently, coughing, runny nose  Protocols used: Blood Pressure - Low-A-AH

## 2023-11-19 ENCOUNTER — Encounter: Payer: Self-pay | Admitting: Internal Medicine

## 2023-11-19 ENCOUNTER — Ambulatory Visit (INDEPENDENT_AMBULATORY_CARE_PROVIDER_SITE_OTHER): Admitting: Internal Medicine

## 2023-11-19 VITALS — BP 108/68 | HR 78 | Temp 98.1°F | Resp 18 | Ht 71.0 in | Wt 203.2 lb

## 2023-11-19 DIAGNOSIS — I10 Essential (primary) hypertension: Secondary | ICD-10-CM

## 2023-11-19 DIAGNOSIS — F32A Depression, unspecified: Secondary | ICD-10-CM | POA: Diagnosis not present

## 2023-11-19 DIAGNOSIS — F419 Anxiety disorder, unspecified: Secondary | ICD-10-CM

## 2023-11-19 MED ORDER — BENAZEPRIL HCL 10 MG PO TABS: 10.0000 mg | ORAL_TABLET | Freq: Every day | ORAL | 0 refills | Status: DC

## 2023-11-19 NOTE — Patient Instructions (Addendum)
 INSTRUCTIONS  FOR TODAY  Reduce benazepril  to 10 mg daily.   Check the  blood pressure regularly Blood pressure goal:  between 110/65 and  135/85. If it is consistently higher or lower, let me know    See you 02/18/2024
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
## 2023-11-20 DIAGNOSIS — D045 Carcinoma in situ of skin of trunk: Secondary | ICD-10-CM | POA: Diagnosis not present

## 2023-11-20 NOTE — Assessment & Plan Note (Signed)
 HTN: BP noted to be low in the last couple of weeks.  Only sx is mild lightheadedness.  Last BMP and CBC okay Was on benazepril  20 mg BID however he has been taking only 1 tablet daily for a long time. Plan: Reduce benazepril  to 10 mg daily, Rx sent, continue Diltiazem .  Monitor BPs.  Call if BP is not gradually improving. Anxiety: On citalopram  20 mg for a while, wife noted him to be "anxious about everything" thus  he started to take 40 mg and is now better. RTC as scheduled for August

## 2023-12-31 ENCOUNTER — Ambulatory Visit (INDEPENDENT_AMBULATORY_CARE_PROVIDER_SITE_OTHER): Admitting: Family Medicine

## 2023-12-31 ENCOUNTER — Encounter: Payer: Self-pay | Admitting: Internal Medicine

## 2023-12-31 VITALS — BP 119/73 | HR 66 | Ht 71.0 in | Wt 204.0 lb

## 2023-12-31 DIAGNOSIS — I10 Essential (primary) hypertension: Secondary | ICD-10-CM | POA: Diagnosis not present

## 2023-12-31 DIAGNOSIS — R531 Weakness: Secondary | ICD-10-CM

## 2023-12-31 NOTE — Progress Notes (Addendum)
 Acute Office Visit  Subjective:     Patient ID: Rodney Sawyer, male    DOB: May 06, 1946, 78 y.o.   MRN: [id number]  Chief Complaint  Patient presents with   Hypertension    HPI Patient is in today for generalized weakness and blood pressure concerns.    Discussed the use of AI scribe software for clinical note transcription with the patient, who gave verbal consent to proceed.  History of Present Illness Rodney Sawyer Rodney Sawyer is a 78 year old male with hypertension who presents with concerns about blood pressure management and recent falls.  He has been experiencing fluctuations in his blood pressure over the past few weeks, with home readings ranging from 118/76 to 159/90; majority are <140/90. His blood pressure tends to rise during periods of stress, such as family issues and caring for daughter's dog. He has been taking benazepril  20 mg twice daily, although he was previously advised to take it once daily. He also takes diltiazem  and has not used flecainide  for almost three years.  He reports recent falls in the yard over the past four weeks, which he attributes to balance issues - states he will bend down to pull weeds and end up losing his balance and having to sit on the ground. He wants to transition from using a walker back to a cane. He underwent hernia surgery on January 30th, which limited his physical activity. He would like to try physical therapy.   His social history includes stress related to family matters, such as his daughter's travel and caring for her dog, which has anxiety issues. He also mentions the stress of a friend's cancer diagnosis. He and his spouse have been trying to maintain physical activity by going to the gym, although they have not been consistent due to recent health issues.  No chest pain, vision changes, or headaches. He reports occasional lightheadedness, particularly when moving too quickly.         ROS All review of systems negative  except what is listed in the HPI      Objective:    BP 119/73   Pulse 66   Ht 5' 11 (1.803 m)   Wt 204 lb (92.5 kg)   SpO2 96%   BMI 28.45 kg/m    Physical Exam Vitals reviewed.  Constitutional:      Appearance: Normal appearance.   Cardiovascular:     Rate and Rhythm: Normal rate and regular rhythm.  Pulmonary:     Effort: Pulmonary effort is normal.     Breath sounds: Normal breath sounds.   Skin:    General: Skin is warm and dry.   Neurological:     Mental Status: He is alert and oriented to person, place, and time.   Psychiatric:        Mood and Affect: Mood normal.        Behavior: Behavior normal.        Thought Content: Thought content normal.        Judgment: Judgment normal.     Results for orders placed or performed in visit on 12/31/23  Basic metabolic panel with GFR  Result Value Ref Range   Sodium 134 (L) 135 - 145 mEq/L   Potassium 4.0 3.5 - 5.1 mEq/L   Chloride 99 96 - 112 mEq/L   CO2 24 19 - 32 mEq/L   Glucose, Bld 100 (H) 70 - 99 mg/dL   BUN 17 6 - 23 mg/dL   Creatinine,  Ser 1.12 0.40 - 1.50 mg/dL   GFR 46.96 >29.52 mL/min   Calcium  9.2 8.4 - 10.5 mg/dL        Assessment & Plan:   Problem List Items Addressed This Visit       Active Problems   Essential hypertension   Relevant Orders   Basic metabolic panel with GFR (Completed)   Other Visit Diagnoses       Generalized weakness    -  Primary   Relevant Orders   Ambulatory referral to Physical Therapy       Assessment & Plan Hypertension Blood pressure variable, current reading 119/73. Benazepril  self-adjusted to 20 mg twice daily; advised to return to 20 mg once daily to prevent fluctuations and potential dizziness. Only a few high readings at home - correlating with stressful situations, otherwise normal. - Instruct to take benazepril  20 mg once daily in the morning. Continue diltiazem  as prescribed. - Monitor blood pressure two hours post-medication, ensuring calmness  for 10-15 minutes prior. - Document blood pressure readings for 10 days and report if consistently elevated. - Check kidney function with BMP due to recent doubling of benazepril  dosage.  Balance Issues and Fall Risk Balance issues and falls due to weakness. Transition from walker to cane desired. Physical therapy recommended for balance and strength improvement. - Refer to physical therapy at [facility] in [place] for balance and strength training.      No orders of the defined types were placed in this encounter.   Return if symptoms worsen or fail to improve.  Everlina Hock, NP

## 2024-01-01 ENCOUNTER — Ambulatory Visit: Payer: Self-pay | Admitting: Family Medicine

## 2024-01-01 DIAGNOSIS — E871 Hypo-osmolality and hyponatremia: Secondary | ICD-10-CM

## 2024-01-01 LAB — BASIC METABOLIC PANEL WITH GFR
BUN: 17 mg/dL (ref 6–23)
CO2: 24 meq/L (ref 19–32)
Calcium: 9.2 mg/dL (ref 8.4–10.5)
Chloride: 99 meq/L (ref 96–112)
Creatinine, Ser: 1.12 mg/dL (ref 0.40–1.50)
GFR: 62.69 mL/min (ref >60.00)
Glucose, Bld: 100 mg/dL — ABNORMAL HIGH (ref 70–99)
Potassium: 4 meq/L (ref 3.5–5.1)
Sodium: 134 meq/L — ABNORMAL LOW (ref 135–145)

## 2024-01-02 ENCOUNTER — Encounter: Payer: Self-pay | Admitting: Family Medicine

## 2024-01-07 ENCOUNTER — Other Ambulatory Visit: Payer: Self-pay | Admitting: Internal Medicine

## 2024-01-09 ENCOUNTER — Telehealth: Payer: Self-pay | Admitting: Internal Medicine

## 2024-01-09 ENCOUNTER — Encounter: Payer: Self-pay | Admitting: Family Medicine

## 2024-01-09 NOTE — Telephone Encounter (Signed)
 Copied from CRM [id number]. Topic: Referral - Question >> Jan 09, 2024 10:36 AM Adelita E wrote: Reason for CRM: Heron over an [facility] said they have not received the patient's referral for physical therapy. Please fax this referral to [phone].

## 2024-01-13 ENCOUNTER — Other Ambulatory Visit

## 2024-01-15 ENCOUNTER — Other Ambulatory Visit (INDEPENDENT_AMBULATORY_CARE_PROVIDER_SITE_OTHER)

## 2024-01-15 DIAGNOSIS — E871 Hypo-osmolality and hyponatremia: Secondary | ICD-10-CM

## 2024-01-15 LAB — BASIC METABOLIC PANEL WITH GFR
BUN: 21 mg/dL (ref 7–25)
CO2: 27 mmol/L (ref 20–32)
Calcium: 9.1 mg/dL (ref 8.6–10.3)
Chloride: 101 mmol/L (ref 98–110)
Creat: 0.91 mg/dL (ref 0.70–1.28)
Glucose, Bld: 81 mg/dL (ref 65–99)
Potassium: 4.1 mmol/L (ref 3.5–5.3)
Sodium: 137 mmol/L (ref 135–146)
eGFR: 86 mL/min/{1.73_m2} (ref >=60)

## 2024-01-19 ENCOUNTER — Ambulatory Visit: Payer: Self-pay | Admitting: Family Medicine

## 2024-01-20 DIAGNOSIS — M6281 Muscle weakness (generalized): Secondary | ICD-10-CM | POA: Diagnosis not present

## 2024-01-20 DIAGNOSIS — R293 Abnormal posture: Secondary | ICD-10-CM | POA: Diagnosis not present

## 2024-01-20 DIAGNOSIS — Z9181 History of falling: Secondary | ICD-10-CM | POA: Diagnosis not present

## 2024-01-20 DIAGNOSIS — R2689 Other abnormalities of gait and mobility: Secondary | ICD-10-CM | POA: Diagnosis not present

## 2024-01-21 ENCOUNTER — Encounter: Payer: Self-pay | Admitting: Cardiovascular Disease

## 2024-01-21 DIAGNOSIS — M6281 Muscle weakness (generalized): Secondary | ICD-10-CM | POA: Diagnosis not present

## 2024-01-21 DIAGNOSIS — Z9181 History of falling: Secondary | ICD-10-CM | POA: Diagnosis not present

## 2024-01-21 DIAGNOSIS — R293 Abnormal posture: Secondary | ICD-10-CM | POA: Diagnosis not present

## 2024-01-21 DIAGNOSIS — R2689 Other abnormalities of gait and mobility: Secondary | ICD-10-CM | POA: Diagnosis not present

## 2024-01-21 DIAGNOSIS — I480 Paroxysmal atrial fibrillation: Secondary | ICD-10-CM

## 2024-01-21 MED ORDER — APIXABAN 5 MG PO TABS: 5.0000 mg | ORAL_TABLET | Freq: Two times a day (BID) | ORAL | 1 refills | Status: DC

## 2024-01-21 NOTE — Telephone Encounter (Signed)
 Pt last saw Dr Delford 02/20/23, last labs 01/15/24 Creat 0.91, age 78, weight 92.5kg, based on specified criteria pt is on appropriate dosage of Eliquis  5mg  BID for afib.  Will refill rx.

## 2024-01-28 DIAGNOSIS — R293 Abnormal posture: Secondary | ICD-10-CM | POA: Diagnosis not present

## 2024-01-28 DIAGNOSIS — M6281 Muscle weakness (generalized): Secondary | ICD-10-CM | POA: Diagnosis not present

## 2024-01-28 DIAGNOSIS — Z9181 History of falling: Secondary | ICD-10-CM | POA: Diagnosis not present

## 2024-01-28 DIAGNOSIS — R2689 Other abnormalities of gait and mobility: Secondary | ICD-10-CM | POA: Diagnosis not present

## 2024-01-29 DIAGNOSIS — R2689 Other abnormalities of gait and mobility: Secondary | ICD-10-CM | POA: Diagnosis not present

## 2024-01-29 DIAGNOSIS — M6281 Muscle weakness (generalized): Secondary | ICD-10-CM | POA: Diagnosis not present

## 2024-01-29 DIAGNOSIS — Z9181 History of falling: Secondary | ICD-10-CM | POA: Diagnosis not present

## 2024-01-29 DIAGNOSIS — R293 Abnormal posture: Secondary | ICD-10-CM | POA: Diagnosis not present

## 2024-02-02 DIAGNOSIS — Z9181 History of falling: Secondary | ICD-10-CM | POA: Diagnosis not present

## 2024-02-02 DIAGNOSIS — M6281 Muscle weakness (generalized): Secondary | ICD-10-CM | POA: Diagnosis not present

## 2024-02-02 DIAGNOSIS — R293 Abnormal posture: Secondary | ICD-10-CM | POA: Diagnosis not present

## 2024-02-02 DIAGNOSIS — R2689 Other abnormalities of gait and mobility: Secondary | ICD-10-CM | POA: Diagnosis not present

## 2024-02-04 DIAGNOSIS — Z9181 History of falling: Secondary | ICD-10-CM | POA: Diagnosis not present

## 2024-02-04 DIAGNOSIS — R2689 Other abnormalities of gait and mobility: Secondary | ICD-10-CM | POA: Diagnosis not present

## 2024-02-04 DIAGNOSIS — R293 Abnormal posture: Secondary | ICD-10-CM | POA: Diagnosis not present

## 2024-02-04 DIAGNOSIS — M6281 Muscle weakness (generalized): Secondary | ICD-10-CM | POA: Diagnosis not present

## 2024-02-10 DIAGNOSIS — R293 Abnormal posture: Secondary | ICD-10-CM | POA: Diagnosis not present

## 2024-02-10 DIAGNOSIS — R2689 Other abnormalities of gait and mobility: Secondary | ICD-10-CM | POA: Diagnosis not present

## 2024-02-10 DIAGNOSIS — Z9181 History of falling: Secondary | ICD-10-CM | POA: Diagnosis not present

## 2024-02-10 DIAGNOSIS — M6281 Muscle weakness (generalized): Secondary | ICD-10-CM | POA: Diagnosis not present

## 2024-02-12 DIAGNOSIS — Z9181 History of falling: Secondary | ICD-10-CM | POA: Diagnosis not present

## 2024-02-12 DIAGNOSIS — M6281 Muscle weakness (generalized): Secondary | ICD-10-CM | POA: Diagnosis not present

## 2024-02-12 DIAGNOSIS — R2689 Other abnormalities of gait and mobility: Secondary | ICD-10-CM | POA: Diagnosis not present

## 2024-02-12 DIAGNOSIS — R293 Abnormal posture: Secondary | ICD-10-CM | POA: Diagnosis not present

## 2024-02-17 DIAGNOSIS — R2689 Other abnormalities of gait and mobility: Secondary | ICD-10-CM | POA: Diagnosis not present

## 2024-02-17 DIAGNOSIS — M6281 Muscle weakness (generalized): Secondary | ICD-10-CM | POA: Diagnosis not present

## 2024-02-17 DIAGNOSIS — R293 Abnormal posture: Secondary | ICD-10-CM | POA: Diagnosis not present

## 2024-02-17 DIAGNOSIS — Z9181 History of falling: Secondary | ICD-10-CM | POA: Diagnosis not present

## 2024-02-18 ENCOUNTER — Ambulatory Visit: Payer: Medicare Other | Admitting: Internal Medicine

## 2024-02-18 VITALS — BP 122/76 | HR 58 | Temp 97.8°F | Resp 12 | Ht 71.0 in | Wt 205.4 lb

## 2024-02-18 DIAGNOSIS — I480 Paroxysmal atrial fibrillation: Secondary | ICD-10-CM | POA: Diagnosis not present

## 2024-02-18 DIAGNOSIS — I10 Essential (primary) hypertension: Secondary | ICD-10-CM | POA: Diagnosis not present

## 2024-02-18 DIAGNOSIS — E785 Hyperlipidemia, unspecified: Secondary | ICD-10-CM | POA: Diagnosis not present

## 2024-02-18 LAB — CBC WITH DIFFERENTIAL/PLATELET
Basophils Absolute: 0 K/uL (ref 0.0–0.1)
Basophils Relative: 0.4 % (ref 0.0–3.0)
Eosinophils Absolute: 0.3 K/uL (ref 0.0–0.7)
Eosinophils Relative: 4 % (ref 0.0–5.0)
HCT: 41.9 % (ref 39.0–52.0)
Hemoglobin: 13.8 g/dL (ref 13.0–17.0)
Lymphocytes Relative: 20.1 % (ref 12.0–46.0)
Lymphs Abs: 1.4 K/uL (ref 0.7–4.0)
MCHC: 33 g/dL (ref 30.0–36.0)
MCV: 94.2 fl (ref 78.0–100.0)
Monocytes Absolute: 0.6 K/uL (ref 0.1–1.0)
Monocytes Relative: 8.5 % (ref 3.0–12.0)
Neutro Abs: 4.6 K/uL (ref 1.4–7.7)
Neutrophils Relative %: 67 % (ref 43.0–77.0)
Platelets: 220 K/uL (ref 150.0–400.0)
RBC: 4.44 Mil/uL (ref 4.22–5.81)
RDW: 13.2 % (ref 11.5–15.5)
WBC: 6.9 K/uL (ref 4.0–10.5)

## 2024-02-18 LAB — AST: AST: 18 U/L (ref 0–37)

## 2024-02-18 LAB — ALT: ALT: 12 U/L (ref 0–53)

## 2024-02-18 NOTE — Progress Notes (Unsigned)
 Cardiology Office Note    Date:  02/20/2024  ID:  Czar, Ysaguirre Dec 23, 1945, MRN [id number] PCP:  Amon Aloysius BRAVO, MD  Cardiologist:  Maude Emmer, MD  Electrophysiologist:  None   Chief Complaint: Follow up for atrial fibrillation   History of Present Illness: .   QUENTON RECENDEZ is a 78 y.o. male with visit-pertinent history of PAF, hypertension and hyperlipidemia.  Patient established care with Dr. Emmer on 07/25/14 following an ER visit in 2015 with palpitations, notes indicated he converted from atrial fibrillation spontaneously without medications.  Echo in 07/2014 indicated EF 50 to 55%, trivial AR, mild LAE.  In 2019 he presented to the ER with PAF, patient did pill in the pocket flecainide , did not immediately convert but broke with Cardizem .  He was started on Eliquis  at that time.  Echocardiogram on 09/17/2022 indicated LVEF 60 to 65%, no RWMA, diastolic parameters were normal, RV systolic function and size was normal, normal PASP, LA was mildly dilated, aortic valve sclerosis was present without evidence of stenosis.  Borderline dilation of the ascending aorta measuring 40 mm.  Patient was last seen in clinic by Dr. Emmer on 02/20/2023, it was noted that patient was in an Alzheimer's study at [facility] and was on metformin.  He had remained stable from a cardiac standpoint.  Today he presents for follow up. He reports that he has been doing well. He reports that has not had any atrial fibrillation in the last two years.  Patient reports that he has previously had a skipped beat sensation when he works in the yard however reports that a few months ago he started drinking a glass of orange juice and eating a banana every morning with a complete resolution in symptoms.  He denies any further palpitations.  He denies any chest pain, shortness of breath, lower extremity edema, orthopnea or PND.  He denies any presyncope or syncope.  He does note that he fell a few months ago in his yard, he reports  he was shoveling dirt and lost his balance and fell over, reports he did not hit his head and was evaluated by his PCP.   Labwork independently reviewed: 01/15/2024: Sodium 137, potassium 4.1, creatinine 0.91 02/18/2024: Hemoglobin 13.8, hematocrit 41.9 ROS: .   Today he denies chest pain, shortness of breath, lower extremity edema, fatigue, palpitations, melena, hematuria, hemoptysis, diaphoresis, weakness, presyncope, syncope, orthopnea, and PND.  All other systems are reviewed and otherwise negative. Studies Reviewed: SABRA   EKG:  EKG is ordered today, personally reviewed, demonstrating  EKG Interpretation Date/Time:  Friday February 20 2024 10:56:54 EDT Ventricular Rate:  67 PR Interval:  174 QRS Duration:  94 QT Interval:  422 QTC Calculation: 445 R Axis:   -30  Text Interpretation: Normal sinus rhythm Left axis deviation Confirmed by Tanekia Ryans [phone]) on 02/20/2024 11:01:37 AM   CV Studies: Cardiac studies reviewed are outlined and summarized above. Otherwise please see EMR for full report. Cardiac Studies & Procedures   ______________________________________________________________________________________________     ECHOCARDIOGRAM  ECHOCARDIOGRAM COMPLETE 09/17/2022  Narrative ECHOCARDIOGRAM REPORT    Patient Name:   LONZELL DORRIS Date of Exam: 09/17/2022 Medical Rec #:  [id number]      Height:       71.0 in Accession #:    [id number]     Weight:       222.0 lb Date of Birth:  [birth year]       BSA:  2.204 m Patient Age:    76 years       BP:           106/68 mmHg Patient Gender: M              HR:           66 bpm. Exam Location:  [address]  Procedure: 2D Echo, 3D Echo, Cardiac Doppler and Color Doppler  Indications:    I48.91 Atrial Fibrillation  History:        Patient has prior history of Echocardiogram examinations, most recent 08/04/2014. Arrythmias:Atrial Fibrillation; Risk Factors:Hypertension, Dyslipidemia and Family History of Coronary Artery Disease.  Hepatitis A.  Sonographer:    Heather Hawks RDCS Referring Phys: MAUDE JAYSON EMMER  IMPRESSIONS   1. Left ventricular ejection fraction, by estimation, is 60 to 65%. Left ventricular ejection fraction by 3D volume is 68 %. The left ventricle has normal function. The left ventricle has no regional wall motion abnormalities. Left ventricular diastolic parameters were normal. 2. Right ventricular systolic function is normal. The right ventricular size is normal. There is normal pulmonary artery systolic pressure. The estimated right ventricular systolic pressure is 26.5 mmHg. 3. Left atrial size was mildly dilated. 4. The mitral valve is normal in structure. No evidence of mitral valve regurgitation. No evidence of mitral stenosis. 5. The aortic valve is tricuspid. There is mild calcification of the aortic valve. Aortic valve regurgitation is not visualized. Aortic valve sclerosis/calcification is present, without any evidence of aortic stenosis. 6. There is borderline dilatation of the ascending aorta, measuring 40 mm. 7. The inferior vena cava is dilated in size with >50% respiratory variability, suggesting right atrial pressure of 8 mmHg.  Comparison(s): Prior images unable to be directly viewed, comparison made by report only.  FINDINGS Left Ventricle: Left ventricular ejection fraction, by estimation, is 60 to 65%. Left ventricular ejection fraction by 3D volume is 68 %. The left ventricle has normal function. The left ventricle has no regional wall motion abnormalities. The left ventricular internal cavity size was normal in size. There is no left ventricular hypertrophy. Left ventricular diastolic parameters were normal. Normal left ventricular filling pressure.  Right Ventricle: The right ventricular size is normal. No increase in right ventricular wall thickness. Right ventricular systolic function is normal. There is normal pulmonary artery systolic pressure. The tricuspid regurgitant  velocity is 2.15 m/s, and with an assumed right atrial pressure of 8 mmHg, the estimated right ventricular systolic pressure is 26.5 mmHg.  Left Atrium: Left atrial size was mildly dilated.  Right Atrium: Right atrial size was normal in size. Prominent Chiari network.  Pericardium: There is no evidence of pericardial effusion.  Mitral Valve: The mitral valve is normal in structure. No evidence of mitral valve regurgitation. No evidence of mitral valve stenosis.  Tricuspid Valve: The tricuspid valve is normal in structure. Tricuspid valve regurgitation is trivial. No evidence of tricuspid stenosis.  Aortic Valve: The aortic valve is tricuspid. There is mild calcification of the aortic valve. Aortic valve regurgitation is not visualized. Aortic valve sclerosis/calcification is present, without any evidence of aortic stenosis. Aortic valve mean gradient measures 5.0 mmHg. Aortic valve peak gradient measures 9.1 mmHg. Aortic valve area, by VTI measures 2.89 cm.  Pulmonic Valve: The pulmonic valve was normal in structure. Pulmonic valve regurgitation is mild to moderate. No evidence of pulmonic stenosis.  Aorta: The aortic root is normal in size and structure. There is borderline dilatation of the ascending aorta, measuring 40 mm.  Venous: The inferior vena cava is dilated in size with greater than 50% respiratory variability, suggesting right atrial pressure of 8 mmHg.  IAS/Shunts: No atrial level shunt detected by color flow Doppler.   LEFT VENTRICLE PLAX 2D LVIDd:         4.80 cm         Diastology LVIDs:         3.50 cm         LV e' medial:    10.65 cm/s LV PW:         0.80 cm         LV E/e' medial:  5.8 LV IVS:        1.00 cm         LV e' lateral:   12.90 cm/s LVOT diam:     2.20 cm         LV E/e' lateral: 4.8 LV SV:         96 LV SV Index:   44 LVOT Area:     3.80 cm        3D Volume EF LV 3D EF:    Left ventricul ar ejection fraction by 3D volume is 68 %.  3D  Volume EF: 3D EF:        68 % LV EDV:       207 ml LV ESV:       67 ml LV SV:        140 ml  RIGHT VENTRICLE RV Basal diam:  3.20 cm RV S prime:     17.50 cm/s RVSP:           21.5 mmHg  LEFT ATRIUM             Index        RIGHT ATRIUM           Index LA diam:        4.10 cm 1.86 cm/m   RA Pressure: 3.00 mmHg LA Vol (A2C):   58.2 ml 26.40 ml/m  RA Area:     12.90 cm LA Vol (A4C):   70.8 ml 32.12 ml/m  RA Volume:   28.30 ml  12.84 ml/m LA Biplane Vol: 65.5 ml 29.72 ml/m AORTIC VALVE AV Area (Vmax):    3.05 cm AV Area (Vmean):   2.85 cm AV Area (VTI):     2.89 cm AV Vmax:           151.00 cm/s AV Vmean:          102.000 cm/s AV VTI:            0.334 m AV Peak Grad:      9.1 mmHg AV Mean Grad:      5.0 mmHg LVOT Vmax:         121.00 cm/s LVOT Vmean:        76.550 cm/s LVOT VTI:          0.254 m LVOT/AV VTI ratio: 0.76  AORTA Ao Root diam: 3.60 cm Ao Asc diam:  4.00 cm  MITRAL VALVE               TRICUSPID VALVE MV Area (PHT): cm         TR Peak grad:   18.5 mmHg MV Decel Time: 250 msec    TR Vmax:        215.00 cm/s MV E velocity: 61.55 cm/s  Estimated RAP:  3.00 mmHg MV A velocity: 78.35 cm/s  RVSP:           21.5 mmHg MV E/A ratio:  0.79 SHUNTS Systemic VTI:  0.25 m Systemic Diam: 2.20 cm  Jerel Croitoru MD Electronically signed by Jerel Balding MD Signature Date/Time: 09/17/2022/3:07:40 PM    Final          ______________________________________________________________________________________________       Current Reported Medications:.    Current Meds  Medication Sig   apixaban  (ELIQUIS ) 5 MG TABS tablet Take 1 tablet (5 mg total) by mouth 2 (two) times daily.   benazepril  (LOTENSIN ) 20 MG tablet Take 20 mg by mouth daily.   calcium  carbonate (TUMS - DOSED IN MG ELEMENTAL CALCIUM ) 500 MG chewable tablet Chew 1 tablet by mouth daily as needed for indigestion or heartburn.   ciclopirox (PENLAC) 8 % solution Apply 1 Application topically at  bedtime.   citalopram  (CELEXA ) 40 MG tablet Take 1 tablet (40 mg total) by mouth daily.   clobetasol (TEMOVATE) 0.05 % external solution Apply 1 Application topically daily as needed (scalp itching).   diltiazem  (DILT-XR) 180 MG 24 hr capsule Take 1 capsule (180 mg total) by mouth in the morning and at bedtime.   Docusate Calcium  (STOOL SOFTENER PO) Take 1 capsule by mouth at bedtime.   Investigational - Study Medication Take 4 tablets by mouth daily at 12 noon. Study name: Alzheimer's [facility] [place] Study Additional study details: Metformin ER 500 mg or placebo   Omega-3 Fatty Acids (FISH OIL PO) Take 1 capsule by mouth at bedtime.   polyethylene glycol (MIRALAX / GLYCOLAX) 17 g packet Take 17 g by mouth at bedtime.   rosuvastatin  (CRESTOR ) 40 MG tablet Take 0.5 tablets (20 mg total) by mouth as directed. 3 times a week   [DISCONTINUED] flecainide  (TAMBOCOR ) 100 MG tablet TAKE 3 TABLETS(300MG  TOTAL) BY MOUTH AS NEEDED FOR EPISODE OF AFIB AS DIRECTED   Physical Exam:    VS:  BP 122/80   Pulse 69   Ht 5' 11 (1.803 m)   Wt 207 lb (93.9 kg)   SpO2 97%   BMI 28.87 kg/m    Wt Readings from Last 3 Encounters:  02/20/24 207 lb (93.9 kg)  02/18/24 205 lb 6.4 oz (93.2 kg)  12/31/23 204 lb (92.5 kg)    GEN: Well nourished, well developed in no acute distress NECK: No JVD; No carotid bruits CARDIAC: RRR, no murmurs, rubs, gallops RESPIRATORY:  Clear to auscultation without rales, wheezing or rhonchi  ABDOMEN: Soft, non-tender, non-distended EXTREMITIES:  No edema; No acute deformity     Asessement and Plan:.    PAF: Patient with history of PAF.  He has been maintaining normal sinus rhythm, does PIP flecainide  and is maintained on diltiazem .  Patient reports that he has not had any episodes of atrial fibrillation in over 2 years.  He did note an occasional palpitation earlier in the year described as a skipped beat while he was out working in the yard, he reports that he increased his intake of  potassium with complete resolution in symptoms.  He denies any bleeding problems on Eliquis .  He does not meet criteria for him decreased dose of Eliquis .  Patient will continue to monitor for increased palpitations.  Reviewed ED precautions.  Continue Eliquis  5 mg twice daily and diltiazem  180 mg twice daily  HTN: Blood pressure today 122/80. Continue benazepril  20 mg daily and diltiazem  180 mg daily .     Disposition: F/u with Dr. Nishan in one year.  Signed, Esteban Kobashigawa D Currie Dennin, NP

## 2024-02-18 NOTE — Patient Instructions (Addendum)
 Vaccines are recommended: A flu shot and a COVID booster this fall  Continue checking your blood pressure regularly Blood pressure goal:  between 110/65 and  135/85. If it is consistently higher or lower, let me know     GO TO THE LAB :  Get the blood work   Your results will be posted on MyChart with my comments  Next office visit for a checkup in 4 months Please make an appointment before you leave today
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
## 2024-02-19 ENCOUNTER — Ambulatory Visit: Payer: Self-pay | Admitting: Internal Medicine

## 2024-02-19 DIAGNOSIS — R2689 Other abnormalities of gait and mobility: Secondary | ICD-10-CM | POA: Diagnosis not present

## 2024-02-19 DIAGNOSIS — Z9181 History of falling: Secondary | ICD-10-CM | POA: Diagnosis not present

## 2024-02-19 DIAGNOSIS — R293 Abnormal posture: Secondary | ICD-10-CM | POA: Diagnosis not present

## 2024-02-19 DIAGNOSIS — M6281 Muscle weakness (generalized): Secondary | ICD-10-CM | POA: Diagnosis not present

## 2024-02-19 NOTE — Assessment & Plan Note (Signed)
 HTN: At the last visit with me, BP was low, benazepril  reduced to 10 mg daily subsequently was seen at this office in June with variable BP, patient self increased benazepril  to 20 mg twice daily but was recommended to go back to 20 mg daily. So, at this point he is on benazepril  20 mg, diltiazem , ambulatory BPs all WNL.  Last BMP okay.  No change Hyperlipidemia: Last LDL 66.  Continue Crestor  40 mg Atrial fibrillation: Asymptomatic, anticoagulated, check CBC Gait disorder, in June, he was referred to PT. working actively with them on fall prevention. Vaccine advised: Flu shot and COVID booster RTC 4 months

## 2024-02-20 ENCOUNTER — Ambulatory Visit: Attending: Cardiology | Admitting: Cardiology

## 2024-02-20 ENCOUNTER — Encounter: Payer: Self-pay | Admitting: Cardiology

## 2024-02-20 VITALS — BP 122/80 | HR 69 | Ht 71.0 in | Wt 207.0 lb

## 2024-02-20 DIAGNOSIS — Z7901 Long term (current) use of anticoagulants: Secondary | ICD-10-CM | POA: Diagnosis not present

## 2024-02-20 DIAGNOSIS — I10 Essential (primary) hypertension: Secondary | ICD-10-CM | POA: Diagnosis not present

## 2024-02-20 DIAGNOSIS — I480 Paroxysmal atrial fibrillation: Secondary | ICD-10-CM

## 2024-02-20 MED ORDER — FLECAINIDE ACETATE 100 MG PO TABS: ORAL_TABLET | ORAL | 3 refills | Status: AC

## 2024-02-20 NOTE — Patient Instructions (Addendum)
 Medication Instructions:  No changes *If you need a refill on your cardiac medications before your next appointment, please call your pharmacy*  Lab Work: No labs  Testing/Procedures: No testing  Follow-Up: At [facility], you and your health needs are our priority.  As part of our continuing mission to provide you with exceptional heart care, our providers are all part of one team.  This team includes your primary Cardiologist (physician) and Advanced Practice Providers or APPs (Physician Assistants and Nurse Practitioners) who all work together to provide you with the care you need, when you need it.  Your next appointment:   1 year(s)  Provider:   Janelle Mediate, MD    We recommend signing up for the patient portal called MyChart.  Sign up information is provided on this After Visit Summary.  MyChart is used to connect with patients for Virtual Visits (Telemedicine).  Patients are able to view lab/test results, encounter notes, upcoming appointments, etc.  Non-urgent messages can be sent to your provider as well.   To learn more about what you can do with MyChart, go to [email or web address].

## 2024-02-23 ENCOUNTER — Other Ambulatory Visit: Payer: Self-pay | Admitting: Internal Medicine

## 2024-02-23 NOTE — Telephone Encounter (Signed)
 Copied from CRM [id number]. Topic: Clinical - Medication Refill >> Feb 23, 2024  3:07 PM Macario HERO wrote: Medication: benazepril  (LOTENSIN ) 20 MG tablet [[id number]]  Has the patient contacted their pharmacy? No (Agent: If no, request that the patient contact the pharmacy for the refill. If patient does not wish to contact the pharmacy document the reason why and proceed with request.) (Agent: If yes, when and what did the pharmacy advise?)  This is the patient's preferred pharmacy:  [id number], [place] - [address] AT [facility] [address] [place] [address] Phone: [phone] Fax: [phone]   Is this the correct pharmacy for this prescription? Yes If no, delete pharmacy and type the correct one.   Has the prescription been filled recently? No  Is the patient out of the medication? No  Has the patient been seen for an appointment in the last year OR does the patient have an upcoming appointment? Yes  Can we respond through MyChart? Yes  Agent: Please be advised that Rx refills may take up to 3 business days. We ask that you follow-up with your pharmacy.

## 2024-02-23 NOTE — Telephone Encounter (Unsigned)
 Copied from CRM [id number]. Topic: Clinical - Medication Refill >> Feb 23, 2024  3:07 PM Macario HERO wrote: Medication: benazepril  (LOTENSIN ) 20 MG tablet [[id number]]  Has the patient contacted their pharmacy? No (Agent: If no, request that the patient contact the pharmacy for the refill. If patient does not wish to contact the pharmacy document the reason why and proceed with request.) (Agent: If yes, when and what did the pharmacy advise?)  This is the patient's preferred pharmacy:  [id number] [place] - [address] AT [facility] [address] [place] [address] Phone: [phone] Fax: [phone]   Is this the correct pharmacy for this prescription? Yes If no, delete pharmacy and type the correct one.   Has the prescription been filled recently? No  Is the patient out of the medication? No  Has the patient been seen for an appointment in the last year OR does the patient have an upcoming appointment? Yes  Can we respond through MyChart? Yes  Agent: Please be advised that Rx refills may take up to 3 business days. We ask that you follow-up with your pharmacy.

## 2024-02-24 MED ORDER — BENAZEPRIL HCL 20 MG PO TABS: 20.0000 mg | ORAL_TABLET | Freq: Every day | ORAL | 1 refills | Status: AC

## 2024-03-03 DIAGNOSIS — R2689 Other abnormalities of gait and mobility: Secondary | ICD-10-CM | POA: Diagnosis not present

## 2024-03-03 DIAGNOSIS — M6281 Muscle weakness (generalized): Secondary | ICD-10-CM | POA: Diagnosis not present

## 2024-03-03 DIAGNOSIS — R293 Abnormal posture: Secondary | ICD-10-CM | POA: Diagnosis not present

## 2024-03-03 DIAGNOSIS — Z9181 History of falling: Secondary | ICD-10-CM | POA: Diagnosis not present

## 2024-03-05 DIAGNOSIS — M6281 Muscle weakness (generalized): Secondary | ICD-10-CM | POA: Diagnosis not present

## 2024-03-05 DIAGNOSIS — Z9181 History of falling: Secondary | ICD-10-CM | POA: Diagnosis not present

## 2024-03-05 DIAGNOSIS — R293 Abnormal posture: Secondary | ICD-10-CM | POA: Diagnosis not present

## 2024-03-05 DIAGNOSIS — R2689 Other abnormalities of gait and mobility: Secondary | ICD-10-CM | POA: Diagnosis not present

## 2024-03-10 DIAGNOSIS — R293 Abnormal posture: Secondary | ICD-10-CM | POA: Diagnosis not present

## 2024-03-10 DIAGNOSIS — M6281 Muscle weakness (generalized): Secondary | ICD-10-CM | POA: Diagnosis not present

## 2024-03-10 DIAGNOSIS — R2689 Other abnormalities of gait and mobility: Secondary | ICD-10-CM | POA: Diagnosis not present

## 2024-03-10 DIAGNOSIS — Z9181 History of falling: Secondary | ICD-10-CM | POA: Diagnosis not present

## 2024-03-12 DIAGNOSIS — R293 Abnormal posture: Secondary | ICD-10-CM | POA: Diagnosis not present

## 2024-03-12 DIAGNOSIS — M6281 Muscle weakness (generalized): Secondary | ICD-10-CM | POA: Diagnosis not present

## 2024-03-12 DIAGNOSIS — R2689 Other abnormalities of gait and mobility: Secondary | ICD-10-CM | POA: Diagnosis not present

## 2024-03-12 DIAGNOSIS — Z9181 History of falling: Secondary | ICD-10-CM | POA: Diagnosis not present

## 2024-03-16 DIAGNOSIS — Z9181 History of falling: Secondary | ICD-10-CM | POA: Diagnosis not present

## 2024-03-16 DIAGNOSIS — M6281 Muscle weakness (generalized): Secondary | ICD-10-CM | POA: Diagnosis not present

## 2024-03-16 DIAGNOSIS — R293 Abnormal posture: Secondary | ICD-10-CM | POA: Diagnosis not present

## 2024-03-16 DIAGNOSIS — R2689 Other abnormalities of gait and mobility: Secondary | ICD-10-CM | POA: Diagnosis not present

## 2024-03-24 DIAGNOSIS — Z9181 History of falling: Secondary | ICD-10-CM | POA: Diagnosis not present

## 2024-03-24 DIAGNOSIS — R2689 Other abnormalities of gait and mobility: Secondary | ICD-10-CM | POA: Diagnosis not present

## 2024-03-24 DIAGNOSIS — M6281 Muscle weakness (generalized): Secondary | ICD-10-CM | POA: Diagnosis not present

## 2024-03-24 DIAGNOSIS — R293 Abnormal posture: Secondary | ICD-10-CM | POA: Diagnosis not present

## 2024-03-31 DIAGNOSIS — R2689 Other abnormalities of gait and mobility: Secondary | ICD-10-CM | POA: Diagnosis not present

## 2024-03-31 DIAGNOSIS — R293 Abnormal posture: Secondary | ICD-10-CM | POA: Diagnosis not present

## 2024-03-31 DIAGNOSIS — M6281 Muscle weakness (generalized): Secondary | ICD-10-CM | POA: Diagnosis not present

## 2024-03-31 DIAGNOSIS — Z9181 History of falling: Secondary | ICD-10-CM | POA: Diagnosis not present

## 2024-04-02 DIAGNOSIS — M6281 Muscle weakness (generalized): Secondary | ICD-10-CM | POA: Diagnosis not present

## 2024-04-02 DIAGNOSIS — R2689 Other abnormalities of gait and mobility: Secondary | ICD-10-CM | POA: Diagnosis not present

## 2024-04-02 DIAGNOSIS — R293 Abnormal posture: Secondary | ICD-10-CM | POA: Diagnosis not present

## 2024-04-02 DIAGNOSIS — Z9181 History of falling: Secondary | ICD-10-CM | POA: Diagnosis not present

## 2024-04-05 ENCOUNTER — Other Ambulatory Visit: Payer: Self-pay | Admitting: Internal Medicine

## 2024-04-06 DIAGNOSIS — M6281 Muscle weakness (generalized): Secondary | ICD-10-CM | POA: Diagnosis not present

## 2024-04-06 DIAGNOSIS — R2689 Other abnormalities of gait and mobility: Secondary | ICD-10-CM | POA: Diagnosis not present

## 2024-04-06 DIAGNOSIS — Z9181 History of falling: Secondary | ICD-10-CM | POA: Diagnosis not present

## 2024-04-06 DIAGNOSIS — R293 Abnormal posture: Secondary | ICD-10-CM | POA: Diagnosis not present

## 2024-04-14 DIAGNOSIS — M6281 Muscle weakness (generalized): Secondary | ICD-10-CM | POA: Diagnosis not present

## 2024-04-14 DIAGNOSIS — R2689 Other abnormalities of gait and mobility: Secondary | ICD-10-CM | POA: Diagnosis not present

## 2024-04-14 DIAGNOSIS — R293 Abnormal posture: Secondary | ICD-10-CM | POA: Diagnosis not present

## 2024-04-14 DIAGNOSIS — Z9181 History of falling: Secondary | ICD-10-CM | POA: Diagnosis not present

## 2024-04-16 DIAGNOSIS — R293 Abnormal posture: Secondary | ICD-10-CM | POA: Diagnosis not present

## 2024-04-16 DIAGNOSIS — Z9181 History of falling: Secondary | ICD-10-CM | POA: Diagnosis not present

## 2024-04-16 DIAGNOSIS — R2689 Other abnormalities of gait and mobility: Secondary | ICD-10-CM | POA: Diagnosis not present

## 2024-04-16 DIAGNOSIS — M6281 Muscle weakness (generalized): Secondary | ICD-10-CM | POA: Diagnosis not present

## 2024-04-26 DIAGNOSIS — H2513 Age-related nuclear cataract, bilateral: Secondary | ICD-10-CM | POA: Diagnosis not present

## 2024-04-26 DIAGNOSIS — H524 Presbyopia: Secondary | ICD-10-CM | POA: Diagnosis not present

## 2024-04-26 DIAGNOSIS — H35033 Hypertensive retinopathy, bilateral: Secondary | ICD-10-CM | POA: Diagnosis not present

## 2024-04-26 DIAGNOSIS — H5212 Myopia, left eye: Secondary | ICD-10-CM | POA: Diagnosis not present

## 2024-04-26 DIAGNOSIS — H52223 Regular astigmatism, bilateral: Secondary | ICD-10-CM | POA: Diagnosis not present

## 2024-04-27 DIAGNOSIS — Z9181 History of falling: Secondary | ICD-10-CM | POA: Diagnosis not present

## 2024-04-27 DIAGNOSIS — R2689 Other abnormalities of gait and mobility: Secondary | ICD-10-CM | POA: Diagnosis not present

## 2024-04-27 DIAGNOSIS — R293 Abnormal posture: Secondary | ICD-10-CM | POA: Diagnosis not present

## 2024-04-29 DIAGNOSIS — Z9181 History of falling: Secondary | ICD-10-CM | POA: Diagnosis not present

## 2024-04-29 DIAGNOSIS — R293 Abnormal posture: Secondary | ICD-10-CM | POA: Diagnosis not present

## 2024-04-29 DIAGNOSIS — R2689 Other abnormalities of gait and mobility: Secondary | ICD-10-CM | POA: Diagnosis not present

## 2024-04-29 DIAGNOSIS — M6281 Muscle weakness (generalized): Secondary | ICD-10-CM | POA: Diagnosis not present

## 2024-05-03 DIAGNOSIS — R2689 Other abnormalities of gait and mobility: Secondary | ICD-10-CM | POA: Diagnosis not present

## 2024-05-03 DIAGNOSIS — R293 Abnormal posture: Secondary | ICD-10-CM | POA: Diagnosis not present

## 2024-05-03 DIAGNOSIS — M6281 Muscle weakness (generalized): Secondary | ICD-10-CM | POA: Diagnosis not present

## 2024-05-03 DIAGNOSIS — Z9181 History of falling: Secondary | ICD-10-CM | POA: Diagnosis not present

## 2024-05-05 DIAGNOSIS — Z9181 History of falling: Secondary | ICD-10-CM | POA: Diagnosis not present

## 2024-05-05 DIAGNOSIS — M6281 Muscle weakness (generalized): Secondary | ICD-10-CM | POA: Diagnosis not present

## 2024-05-05 DIAGNOSIS — R293 Abnormal posture: Secondary | ICD-10-CM | POA: Diagnosis not present

## 2024-05-05 DIAGNOSIS — R2689 Other abnormalities of gait and mobility: Secondary | ICD-10-CM | POA: Diagnosis not present

## 2024-05-14 DIAGNOSIS — R2689 Other abnormalities of gait and mobility: Secondary | ICD-10-CM | POA: Diagnosis not present

## 2024-05-14 DIAGNOSIS — R293 Abnormal posture: Secondary | ICD-10-CM | POA: Diagnosis not present

## 2024-05-14 DIAGNOSIS — Z9181 History of falling: Secondary | ICD-10-CM | POA: Diagnosis not present

## 2024-05-14 DIAGNOSIS — M6281 Muscle weakness (generalized): Secondary | ICD-10-CM | POA: Diagnosis not present

## 2024-05-18 DIAGNOSIS — M6281 Muscle weakness (generalized): Secondary | ICD-10-CM | POA: Diagnosis not present

## 2024-05-18 DIAGNOSIS — Z9181 History of falling: Secondary | ICD-10-CM | POA: Diagnosis not present

## 2024-05-18 DIAGNOSIS — R2689 Other abnormalities of gait and mobility: Secondary | ICD-10-CM | POA: Diagnosis not present

## 2024-05-18 DIAGNOSIS — R293 Abnormal posture: Secondary | ICD-10-CM | POA: Diagnosis not present

## 2024-05-19 LAB — CBC AND DIFFERENTIAL
HCT: 44 (ref 41–53)
Hemoglobin: 13.9 (ref 13.5–17.5)
Platelets: 248 K/uL (ref 150–400)
WBC: 8.2

## 2024-05-19 LAB — BASIC METABOLIC PANEL WITH GFR
BUN: 18 (ref 4–21)
CO2: 29 — AB (ref 13–22)
Chloride: 100 (ref 99–108)
Creatinine: 1.1 (ref 0.6–1.3)
Glucose: 87
Potassium: 4.3 meq/L (ref 3.5–5.1)
Sodium: 139 (ref 137–147)

## 2024-05-19 LAB — COMPREHENSIVE METABOLIC PANEL WITH GFR
Albumin: 4.6 (ref 3.5–5.0)
Calcium: 9.3 (ref 8.7–10.7)

## 2024-05-19 LAB — LIPID PANEL
Cholesterol: 136 (ref 0–200)
HDL: 55 (ref 35–70)
LDL Cholesterol: 65
Triglycerides: 84 (ref 40–160)

## 2024-05-19 LAB — VITAMIN B12: Vitamin B-12: 702

## 2024-05-19 LAB — CBC: RBC: 4.45 (ref 3.87–5.11)

## 2024-05-20 DIAGNOSIS — M6281 Muscle weakness (generalized): Secondary | ICD-10-CM | POA: Diagnosis not present

## 2024-05-20 DIAGNOSIS — Z9181 History of falling: Secondary | ICD-10-CM | POA: Diagnosis not present

## 2024-05-20 DIAGNOSIS — R2689 Other abnormalities of gait and mobility: Secondary | ICD-10-CM | POA: Diagnosis not present

## 2024-05-20 DIAGNOSIS — R293 Abnormal posture: Secondary | ICD-10-CM | POA: Diagnosis not present

## 2024-05-20 LAB — HEPATIC FUNCTION PANEL
ALT: 14 U/L (ref 10–40)
AST: 19 (ref 14–40)
Alkaline Phosphatase: 53 (ref 25–125)
Bilirubin, Total: 0.6

## 2024-05-25 DIAGNOSIS — D1801 Hemangioma of skin and subcutaneous tissue: Secondary | ICD-10-CM | POA: Diagnosis not present

## 2024-05-25 DIAGNOSIS — L821 Other seborrheic keratosis: Secondary | ICD-10-CM | POA: Diagnosis not present

## 2024-05-25 DIAGNOSIS — D485 Neoplasm of uncertain behavior of skin: Secondary | ICD-10-CM | POA: Diagnosis not present

## 2024-05-25 DIAGNOSIS — L820 Inflamed seborrheic keratosis: Secondary | ICD-10-CM | POA: Diagnosis not present

## 2024-05-25 DIAGNOSIS — L578 Other skin changes due to chronic exposure to nonionizing radiation: Secondary | ICD-10-CM | POA: Diagnosis not present

## 2024-05-25 DIAGNOSIS — L814 Other melanin hyperpigmentation: Secondary | ICD-10-CM | POA: Diagnosis not present

## 2024-05-25 DIAGNOSIS — L538 Other specified erythematous conditions: Secondary | ICD-10-CM | POA: Diagnosis not present

## 2024-05-25 DIAGNOSIS — L570 Actinic keratosis: Secondary | ICD-10-CM | POA: Diagnosis not present

## 2024-06-01 DIAGNOSIS — R293 Abnormal posture: Secondary | ICD-10-CM | POA: Diagnosis not present

## 2024-06-01 DIAGNOSIS — Z9181 History of falling: Secondary | ICD-10-CM | POA: Diagnosis not present

## 2024-06-01 DIAGNOSIS — R2689 Other abnormalities of gait and mobility: Secondary | ICD-10-CM | POA: Diagnosis not present

## 2024-06-01 DIAGNOSIS — M6281 Muscle weakness (generalized): Secondary | ICD-10-CM | POA: Diagnosis not present

## 2024-06-03 DIAGNOSIS — Z9181 History of falling: Secondary | ICD-10-CM | POA: Diagnosis not present

## 2024-06-03 DIAGNOSIS — M6281 Muscle weakness (generalized): Secondary | ICD-10-CM | POA: Diagnosis not present

## 2024-06-03 DIAGNOSIS — R293 Abnormal posture: Secondary | ICD-10-CM | POA: Diagnosis not present

## 2024-06-03 DIAGNOSIS — R2689 Other abnormalities of gait and mobility: Secondary | ICD-10-CM | POA: Diagnosis not present

## 2024-06-07 DIAGNOSIS — R2689 Other abnormalities of gait and mobility: Secondary | ICD-10-CM | POA: Diagnosis not present

## 2024-06-07 DIAGNOSIS — Z9181 History of falling: Secondary | ICD-10-CM | POA: Diagnosis not present

## 2024-06-07 DIAGNOSIS — R293 Abnormal posture: Secondary | ICD-10-CM | POA: Diagnosis not present

## 2024-06-07 DIAGNOSIS — M6281 Muscle weakness (generalized): Secondary | ICD-10-CM | POA: Diagnosis not present

## 2024-06-16 DIAGNOSIS — C44619 Basal cell carcinoma of skin of left upper limb, including shoulder: Secondary | ICD-10-CM | POA: Diagnosis not present

## 2024-06-16 DIAGNOSIS — C44219 Basal cell carcinoma of skin of left ear and external auricular canal: Secondary | ICD-10-CM | POA: Diagnosis not present

## 2024-06-18 ENCOUNTER — Ambulatory Visit: Admitting: Internal Medicine

## 2024-06-24 DIAGNOSIS — R2689 Other abnormalities of gait and mobility: Secondary | ICD-10-CM | POA: Diagnosis not present

## 2024-06-24 DIAGNOSIS — R293 Abnormal posture: Secondary | ICD-10-CM | POA: Diagnosis not present

## 2024-06-24 DIAGNOSIS — Z9181 History of falling: Secondary | ICD-10-CM | POA: Diagnosis not present

## 2024-06-24 DIAGNOSIS — M6281 Muscle weakness (generalized): Secondary | ICD-10-CM | POA: Diagnosis not present

## 2024-07-01 DIAGNOSIS — M6281 Muscle weakness (generalized): Secondary | ICD-10-CM | POA: Diagnosis not present

## 2024-07-01 DIAGNOSIS — R2689 Other abnormalities of gait and mobility: Secondary | ICD-10-CM | POA: Diagnosis not present

## 2024-07-01 DIAGNOSIS — Z9181 History of falling: Secondary | ICD-10-CM | POA: Diagnosis not present

## 2024-07-01 DIAGNOSIS — R293 Abnormal posture: Secondary | ICD-10-CM | POA: Diagnosis not present

## 2024-07-03 ENCOUNTER — Other Ambulatory Visit: Payer: Self-pay | Admitting: Internal Medicine

## 2024-07-05 ENCOUNTER — Encounter: Payer: Self-pay | Admitting: Internal Medicine

## 2024-07-12 ENCOUNTER — Other Ambulatory Visit: Payer: Self-pay | Admitting: Cardiovascular Disease

## 2024-07-12 ENCOUNTER — Encounter: Payer: Self-pay | Admitting: Cardiovascular Disease

## 2024-07-12 DIAGNOSIS — I480 Paroxysmal atrial fibrillation: Secondary | ICD-10-CM

## 2024-07-12 NOTE — Telephone Encounter (Signed)
 Prescription refill request for Eliquis  received. Indication:afib Last office visit:8/25 Scr: 1.1  11/25 Age:78 Weight:93.9  kg  Prescription refilled

## 2024-08-02 ENCOUNTER — Encounter: Payer: Self-pay | Admitting: Internal Medicine

## 2024-08-09 ENCOUNTER — Ambulatory Visit: Admitting: Internal Medicine

## 2024-09-14 ENCOUNTER — Ambulatory Visit: Admitting: Internal Medicine
